# Patient Record
Sex: Female | Born: 1935
Health system: Southern US, Community
[De-identification: ages and names within clinical notes are randomized; demographics above are authoritative.]

## PROBLEM LIST (undated history)

## (undated) DIAGNOSIS — C801 Malignant (primary) neoplasm, unspecified: Secondary | ICD-10-CM

## (undated) DIAGNOSIS — M1990 Unspecified osteoarthritis, unspecified site: Secondary | ICD-10-CM

## (undated) DIAGNOSIS — E785 Hyperlipidemia, unspecified: Secondary | ICD-10-CM

## (undated) DIAGNOSIS — D649 Anemia, unspecified: Secondary | ICD-10-CM

## (undated) DIAGNOSIS — I10 Essential (primary) hypertension: Secondary | ICD-10-CM

## (undated) DIAGNOSIS — K635 Polyp of colon: Secondary | ICD-10-CM

## (undated) DIAGNOSIS — M7020 Olecranon bursitis, unspecified elbow: Secondary | ICD-10-CM

## (undated) DIAGNOSIS — R002 Palpitations: Secondary | ICD-10-CM

## (undated) HISTORY — DX: Polyp of colon: K63.5

## (undated) HISTORY — PX: CRYOTHERAPY: SHX1416

## (undated) HISTORY — DX: Essential (primary) hypertension: I10

## (undated) HISTORY — DX: Palpitations: R00.2

## (undated) HISTORY — DX: Anemia, unspecified: D64.9

## (undated) HISTORY — DX: Hyperlipidemia, unspecified: E78.5

## (undated) HISTORY — DX: Unspecified osteoarthritis, unspecified site: M19.90

## (undated) HISTORY — PX: POLYPECTOMY: SHX149

## (undated) HISTORY — DX: Olecranon bursitis, unspecified elbow: M70.20

---

## 1999-09-29 ENCOUNTER — Encounter: Payer: Self-pay | Admitting: Emergency Medicine

## 1999-09-29 ENCOUNTER — Emergency Department (HOSPITAL_COMMUNITY): Admission: EM | Admit: 1999-09-29 | Discharge: 1999-09-29 | Payer: Self-pay | Admitting: Emergency Medicine

## 1999-10-01 ENCOUNTER — Ambulatory Visit (HOSPITAL_BASED_OUTPATIENT_CLINIC_OR_DEPARTMENT_OTHER): Admission: RE | Admit: 1999-10-01 | Discharge: 1999-10-01 | Payer: Self-pay | Admitting: Orthopedic Surgery

## 2002-07-19 ENCOUNTER — Ambulatory Visit (HOSPITAL_COMMUNITY): Admission: RE | Admit: 2002-07-19 | Discharge: 2002-07-19 | Payer: Self-pay | Admitting: Internal Medicine

## 2002-07-19 ENCOUNTER — Encounter: Payer: Self-pay | Admitting: Internal Medicine

## 2004-01-15 HISTORY — PX: COLONOSCOPY W/ POLYPECTOMY: SHX1380

## 2005-11-13 ENCOUNTER — Other Ambulatory Visit: Admission: RE | Admit: 2005-11-13 | Discharge: 2005-11-13 | Payer: Self-pay | Admitting: Internal Medicine

## 2005-11-13 ENCOUNTER — Ambulatory Visit: Payer: Self-pay | Admitting: Internal Medicine

## 2005-12-01 ENCOUNTER — Ambulatory Visit: Payer: Self-pay | Admitting: Internal Medicine

## 2006-01-19 ENCOUNTER — Ambulatory Visit: Payer: Self-pay | Admitting: Gastroenterology

## 2006-01-27 ENCOUNTER — Ambulatory Visit: Payer: Self-pay | Admitting: Gastroenterology

## 2006-01-27 ENCOUNTER — Encounter: Payer: Self-pay | Admitting: Gastroenterology

## 2006-10-16 ENCOUNTER — Ambulatory Visit: Payer: Self-pay | Admitting: Internal Medicine

## 2007-06-21 ENCOUNTER — Encounter: Payer: Self-pay | Admitting: *Deleted

## 2007-06-21 DIAGNOSIS — Z0000 Encounter for general adult medical examination without abnormal findings: Secondary | ICD-10-CM

## 2007-06-21 DIAGNOSIS — E785 Hyperlipidemia, unspecified: Secondary | ICD-10-CM | POA: Insufficient documentation

## 2007-06-21 DIAGNOSIS — I10 Essential (primary) hypertension: Secondary | ICD-10-CM

## 2007-06-21 DIAGNOSIS — M7020 Olecranon bursitis, unspecified elbow: Secondary | ICD-10-CM | POA: Insufficient documentation

## 2007-06-21 DIAGNOSIS — R002 Palpitations: Secondary | ICD-10-CM | POA: Insufficient documentation

## 2007-10-25 ENCOUNTER — Ambulatory Visit: Payer: Self-pay | Admitting: Internal Medicine

## 2007-10-25 DIAGNOSIS — H8309 Labyrinthitis, unspecified ear: Secondary | ICD-10-CM | POA: Insufficient documentation

## 2008-01-20 ENCOUNTER — Encounter: Payer: Self-pay | Admitting: Internal Medicine

## 2009-02-01 ENCOUNTER — Encounter: Payer: Self-pay | Admitting: Internal Medicine

## 2009-06-06 ENCOUNTER — Encounter: Payer: Self-pay | Admitting: Internal Medicine

## 2009-06-06 DIAGNOSIS — R21 Rash and other nonspecific skin eruption: Secondary | ICD-10-CM | POA: Insufficient documentation

## 2010-02-04 ENCOUNTER — Encounter: Payer: Self-pay | Admitting: Internal Medicine

## 2010-04-16 ENCOUNTER — Telehealth: Payer: Self-pay | Admitting: Internal Medicine

## 2010-04-23 ENCOUNTER — Ambulatory Visit: Payer: Self-pay | Admitting: Internal Medicine

## 2010-04-23 LAB — CONVERTED CEMR LAB
ALT: 16 units/L (ref 0–35)
AST: 23 units/L (ref 0–37)
Albumin: 4 g/dL (ref 3.5–5.2)
Alkaline Phosphatase: 78 units/L (ref 39–117)
Bilirubin, Direct: 0.1 mg/dL (ref 0.0–0.3)
Cholesterol: 161 mg/dL (ref 0–200)
HDL: 44.9 mg/dL (ref >39.00)
LDL Cholesterol: 93 mg/dL (ref 0–99)
Total Bilirubin: 0.6 mg/dL (ref 0.3–1.2)
Total CHOL/HDL Ratio: 4
Total Protein: 7.2 g/dL (ref 6.0–8.3)
Triglycerides: 118 mg/dL (ref 0.0–149.0)
VLDL: 23.6 mg/dL (ref 0.0–40.0)

## 2010-04-24 ENCOUNTER — Ambulatory Visit: Payer: Self-pay | Admitting: Internal Medicine

## 2010-05-31 ENCOUNTER — Ambulatory Visit: Payer: Self-pay | Admitting: Internal Medicine

## 2010-05-31 ENCOUNTER — Encounter: Payer: Self-pay | Admitting: Internal Medicine

## 2010-05-31 LAB — CONVERTED CEMR LAB
ALT: 17 units/L (ref 0–35)
AST: 24 units/L (ref 0–37)
Albumin: 3.9 g/dL (ref 3.5–5.2)
Alkaline Phosphatase: 74 units/L (ref 39–117)
BUN: 17 mg/dL (ref 6–23)
Basophils Absolute: 0 10*3/uL (ref 0.0–0.1)
Basophils Relative: 0.4 % (ref 0.0–3.0)
Bilirubin, Direct: 0.1 mg/dL (ref 0.0–0.3)
CO2: 28 meq/L (ref 19–32)
Calcium: 9.4 mg/dL (ref 8.4–10.5)
Chloride: 108 meq/L (ref 96–112)
Cholesterol: 152 mg/dL (ref 0–200)
Creatinine, Ser: 1 mg/dL (ref 0.4–1.2)
Eosinophils Absolute: 0.1 10*3/uL (ref 0.0–0.7)
Eosinophils Relative: 1.9 % (ref 0.0–5.0)
GFR calc non Af Amer: 66.5 mL/min (ref >60.00)
Glucose, Bld: 90 mg/dL (ref 70–99)
HCT: 41.1 % (ref 36.0–46.0)
HDL: 44.4 mg/dL (ref >39.00)
Hemoglobin: 14.1 g/dL (ref 12.0–15.0)
LDL Cholesterol: 90 mg/dL (ref 0–99)
Lymphocytes Relative: 38 % (ref 12.0–46.0)
Lymphs Abs: 2.3 10*3/uL (ref 0.7–4.0)
MCHC: 34.3 g/dL (ref 30.0–36.0)
MCV: 95.8 fL (ref 78.0–100.0)
Monocytes Absolute: 0.5 10*3/uL (ref 0.1–1.0)
Monocytes Relative: 7.5 % (ref 3.0–12.0)
Neutro Abs: 3.1 10*3/uL (ref 1.4–7.7)
Neutrophils Relative %: 52.2 % (ref 43.0–77.0)
Platelets: 186 10*3/uL (ref 150.0–400.0)
Potassium: 3.8 meq/L (ref 3.5–5.1)
RBC: 4.29 M/uL (ref 3.87–5.11)
RDW: 13.2 % (ref 11.5–14.6)
Sodium: 143 meq/L (ref 135–145)
TSH: 0.82 microintl units/mL (ref 0.35–5.50)
Total Bilirubin: 0.7 mg/dL (ref 0.3–1.2)
Total CHOL/HDL Ratio: 3
Total Protein: 7.3 g/dL (ref 6.0–8.3)
Triglycerides: 90 mg/dL (ref 0.0–149.0)
VLDL: 18 mg/dL (ref 0.0–40.0)
WBC: 6 10*3/uL (ref 4.5–10.5)

## 2010-07-16 NOTE — Assessment & Plan Note (Signed)
Summary: follow up on labs-lb   Vital Signs:  Patient profile:   77 year old female Height:      64 inches Weight:      172 pounds BMI:     29.63 O2 Sat:      97 % on Room air Temp:     97.4 degrees F oral Pulse rate:   54 / minute BP sitting:   110 / 68  (left arm) Cuff size:   regular  Vitals Entered By: Bill Salinas CMA (April 24, 2010 9:48 AM)  O2 Flow:  Room air CC: follow up on labs that were drawn yesterday/ ab   Primary Care Provider:  Norins  CC:  follow up on labs that were drawn yesterday/ ab.  History of Present Illness: patient was last seen in May '09 for an acute problem. No full exam in EMR dating to Oct '08 or before. She presents today becsue she was asked to have lab work done prior to [employer] of vytorin with no labs in the system since '09.   she reports she is feeling well. She has had mammography. She is not prepared today to have an annual wellness exam.  Current Medications (verified): 1)  Lisinopril-Hydrochlorothiazide 10-12.5 Mg  Tabs (Lisinopril-Hydrochlorothiazide) .... Take One Tablet Once Daily 2)  Vytorin 10-20 Mg  Tabs (Ezetimibe-Simvastatin) .... Take One Tablet Once Daily  Allergies (verified): No Known Drug Allergies  Past History:  Past Medical History: Last updated: 06/21/2007 Hx of BURSITIS, ELBOW (ICD-726.33) HYPERLIPIDEMIA (ICD-272.4) HYPERTENSION (ICD-401.9) Hx of PALPITATIONS, HX OF (ICD-V12.50)    Past Surgical History: Last updated: 06/21/2007 POLYPECTOMY, HX OF (ICD-V15.9)  Physical Exam  General:  alert, well-developed, well-nourished, and well-hydrated.   Eyes:  vision grossly intact, pupils equal, and pupils round.   Lungs:  normal respiratory effort.   Neurologic:  alert & oriented X3 and gait normal.   Psych:  normally interactive and good eye contact.     Impression & Recommendations:  Problem # 1:  HYPERLIPIDEMIA (ICD-272.4) LDL 93 HDL normal, Liver functions normal  Plan - vytorin renewed  Her  updated medication list for this problem includes:    Vytorin 10-20 Mg Tabs (Ezetimibe-simvastatin) .Marland Kitchen... Take one tablet once daily  Complete Medication List: 1)  Lisinopril-hydrochlorothiazide 10-12.5 Mg Tabs (Lisinopril-hydrochlorothiazide) .... Take one tablet once daily 2)  Vytorin 10-20 Mg Tabs (Ezetimibe-simvastatin) .... Take one tablet once daily   Orders Added: 1)  Est. Patient Level II [16109]

## 2010-07-16 NOTE — Progress Notes (Signed)
  Phone Note Refill Request Message from:  Fax from Pharmacy on April 16, 2010 8:54 AM  Refills Requested: Medication #1:  VYTORIN 10-20 MG  TABS Take one tablet once daily. Pt hasn't been seen since 2009, please Advise refills  Initial call taken by: Ami Bullins CMA,  April 16, 2010 8:54 AM  Follow-up for Phone Call        ok for one month refill. needs ov with labs bvefore liid 272.4, hepatic 995.20 Follow-up by: Jacques Navy MD,  April 16, 2010 9:21 AM  Additional Follow-up for Phone Call Additional follow up Details #1::        prescriptions sent in for one month supply, appt made for Apr 23, 2010 and labs put in prior to appt Additional Follow-up by: Ami Bullins CMA,  April 16, 2010 10:31 AM    Prescriptions: VYTORIN 10-20 MG  TABS (EZETIMIBE-SIMVASTATIN) Take one tablet once daily  #30 Tablet x 1   Entered by:   Ami Bullins CMA   Authorized by:   Jacques Navy MD   Signed by:   Bill Salinas CMA on 04/16/2010   Method used:   [facility]  [address] [phone][facility] (retail[facility]       [address]       [place], [place]  [address]       Ph: [phone] or [phone]       Fax: [phone]   RxID:   [id number]

## 2010-07-18 NOTE — Assessment & Plan Note (Signed)
Summary: MEDICARE WELLNESS/ STATES SHE ALREADY DID LABS/NWS  #   Vital Signs:  Patient profile:   77 year old female Height:      64 inches Weight:      176 pounds BMI:     30.32 O2 Sat:      94 % on Room air Temp:     98.5 degrees F oral Pulse rate:   57 / minute BP sitting:   130 / 78  (left arm) Cuff size:   regular  Vitals Entered By: Bill Salinas CMA (May 31, 2010 1:22 PM)  O2 Flow:  Room air  Vision Screening:      Vision Comments: pt due for eye exam   Primary Care Provider:  Johnatan Baskette   History of Present Illness: Haley Mcmillan presents for a preventive care visit and general physical exam. She reports that she is feeling well and has no medical complaints. Her interval medical history is benighn with no serios illness, surgery or injury.  She is 100% independent in all ADLs. She is very active in her church and is coordinatingMC'ing the holiday pagent. She is cognitively intact carrying out all the duties of her social commitments and running her household. She has no signs or symptoms of depression. She has had no falls and has no fall risk.   Preventive Screening-Counseling & Management  Alcohol-Tobacco     Alcohol drinks/day: 0     Smoking Status: quit     Year Quit: 1996  Caffeine-Diet-Exercise     Caffeine use/day: none     Diet Comments: unregulated diet     Diet Counseling: to improve diet; diet is suboptimal     Does Patient Exercise: no     Exercise Counseling: to improve exercise regimen  Hep-HIV-STD-Contraception     Hepatitis Risk: no risk noted     HIV Risk: no risk noted     STD Risk: no risk noted     Dental Visit-last 6 months no     Nov 25, 2022 Exposure-Excessive: no  Safety-Violence-Falls     Seat Belt Use: yes     Helmet Use: n/a     Firearms in the Home: no firearms in the home     Smoke Detectors: yes     Violence in the Home: no risk noted     Sexual Abuse: no     Fall Risk: low fall risk      Sexual History:  currently monogamous.         Drug Use:  never.        Blood Transfusions:  no.    Current Medications (verified): 1)  Lisinopril-Hydrochlorothiazide 10-12.5 Mg  Tabs (Lisinopril-Hydrochlorothiazide) .... Take One Tablet Once Daily 2)  Vytorin 10-20 Mg  Tabs (Ezetimibe-Simvastatin) .... Take One Tablet Once Daily  Allergies (verified): No Known Drug Allergies  Past History:  Past Medical History: Last updated: 06/21/2007 Hx of BURSITIS, ELBOW (ICD-726.33) HYPERLIPIDEMIA (ICD-272.4) HYPERTENSION (ICD-401.9) Hx of PALPITATIONS, HX OF (ICD-V12.50)    Past Surgical History: Last updated: 06/21/2007 POLYPECTOMY, HX OF (ICD-V15.9)  Family History: father - deceased @ 45: aneurysm, CAD mother - deceased @ 55: Gallbladder disease Neg- colon cancer, DM, MI 2  sisters with breast cancer  Social History: [employer],  tech school Married - November 25, 2054 - 19 yrs divorced; married November 24, 1977  2 sons - ' 56, '59 - died MVA; 1 dtr November 25, 2059; 9 grandchildren; 6 great-grands work - Architectural technologist, 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 no h/o abuse.Caffeine use/day:  none Does Patient Exercise:  no Dental Care w/in 6 mos.:  no Sun Exposure-Excessive:  no Seat Belt Use:  yes Fall Risk:  low fall risk Blood Transfusions:  no Hepatitis Risk:  no risk noted HIV Risk:  no risk noted STD Risk:  no risk noted Sexual History:  currently monogamous Drug Use:  never  Review of Systems  The patient denies anorexia, fever, weight loss, weight gain, vision loss, decreased hearing, chest pain, dyspnea on exertion, headaches, abdominal pain, severe indigestion/heartburn, incontinence, muscle weakness, difficulty walking, depression, abnormal bleeding, enlarged lymph nodes, and breast masses.    Physical Exam  General:  Well-developed,well-nourished AA woman,in no acute distress; alert,appropriate and cooperative throughout examination Head:  Normocephalic and atraumatic without obvious abnormalities. No apparent alopecia or balding. Eyes:  vision grossly intact,  pupils equal, pupils round, and corneas and lenses clear.   Ears:  R ear normal, L ear normal, and no external deformities. Mild amont of cermen right ear canal, left canal is clear. TM's appear normal  Nose:  no external deformity and no external erythema.   Mouth:  no bccal lesions, throat is clear Neck:  supple, full ROM, no thyromegaly, and no carotid bruits.   Chest Wall:  no deformities and no tenderness.   Breasts:  No mass, nodules, thickening, tenderness, bulging, retraction, inflamation, nipple discharge or skin changes noted.   Lungs:  Normal respiratory effort, chest expands symmetrically. Lungs are clear to auscultation, no crackles or wheezes. Heart:  Normal rate and regular rhythm. S1 and S2 normal without gallop, murmur, click, rub or other extra sounds. Abdomen:  soft, non-tender, normal bowel sounds, no guarding, no rigidity, and no hepatomegaly.   Genitalia:  deferred Msk:  normal ROM, no joint tenderness, no joint swelling, no joint warmth, no redness over joints, and no joint deformities.   Pulses:  2+ radila pulses Extremities:  No clubbing, cyanosis, edema, or deformity noted with normal full range of motion of all joints.   Neurologic:  alert & oriented X3, cranial nerves II-XII intact, strength normal in all extremities, gait normal, and DTRs symmetrical and normal.   Skin:  turgor normal, color normal, no suspicious lesions, and no ulcerations.   Cervical Nodes:  no anterior cervical adenopathy and no posterior cervical adenopathy.   Axillary Nodes:  no R axillary adenopathy and no L axillary adenopathy.   Psych:  Oriented X3, memory intact for recent and remote, and normally interactive.     Impression & Recommendations:  Problem # 1:  POLYPECTOMY, HX OF (ICD-V15.9) no colonoscopy report in EMR. Patient does not recall the date of her last colonoscopy.  Plan - will check paper chart for old reports. Pt. will check her home records  Problem # 2:  HYPERLIPIDEMIA  (ICD-272.4) Due for follow-up lab with recommendations to follow.  Her updated medication list for this problem includes:    Vytorin 10-20 Mg Tabs (Ezetimibe-simvastatin) .Marland Kitchen... Take one tablet once daily  Orders: TLB-Lipid Panel (80061-LIPID) TLB-Hepatic/Liver Function Pnl (80076-HEPATIC) TLB-TSH (Thyroid Stimulating Hormone) (84443-TSH)  Addendum - excellent control of lipids on crrent medications.   Plan - continue vytorin at present dose.  Problem # 3:  HYPERTENSION (ICD-401.9)  Her updated medication list for this problem includes:    Lisinopril-hydrochlorothiazide 10-12.5 Mg Tabs (Lisinopril-hydrochlorothiazide) .Marland Kitchen... Take one tablet once daily  Orders: TLB-BMP (Basic Metabolic Panel-BMET) (80048-METABOL)  BP today: 130/78 Prior BP: 110/68 (04/24/2010)  Adequate control on preent medications. Will continue the same.  Problem # 4:  Hx of PALPITATIONS, HX OF (ICD-V12.50)  No complaints at today's visit. 12 Lead EKG without arrythmia.  Orders: EKG w/ Interpretation (93000)  Problem # 5:  Preventive Health Care (ICD-V70.0)  Interval history is unremarkable. Physical exam is normal. Lab results are within normal limits (see attached). Current with mammography with last study Aug '11. No record of colonoscopy in EMR. Immunizations: patient offered and declined tetnus and pneumo-vax today but is willing to return for the same. She is also a candidate for  shingles vaccine. 12 lead EKG without arrythmia, with changes that could represent pulmonary disease and no signs of ischemia. Spirometry,  former smoker with EKG changes as noted, is normal with lung age less than chronologic age.   In summary - a nice woman who is medically stable with good control of chronic medical problems and who appears fit. She is counseled to continue with a heart healthy low fat diet, to develop a regular exercise program and to help insure that she is current with colorectal cancer screening. She is  advised to retrn at her convenience for immunizations.  Orders: Medicare -1st Annual Wellness Visit [phone])  Complete Medication List: 1)  Lisinopril-hydrochlorothiazide 10-12.5 Mg Tabs (Lisinopril-hydrochlorothiazide) .... Take one tablet once daily 2)  Vytorin 10-20 Mg Tabs (Ezetimibe-simvastatin) .... Take one tablet once daily  Other Orders: Spirometry w/Graph (94010) TLB-CBC Platelet - w/Differential (85025-CBCD)  atient: Haley Mcmillan Note: All result statuses are Final unless otherwise noted.  Tests: (1) Lipid Panel (LIPID)   Cholesterol               152 mg/dL                   2-440     ATP III Classification            Desirable:  < 200 mg/dL                    Borderline High:  200 - 239 mg/dL               High:  > = 240 mg/dL   Triglycerides             90.0 mg/dL                  1.0-272.5     Normal:  <150 mg/dL     Borderline High:  366 - 199 mg/dL   HDL                       44.03 mg/dL                 >47.42   VLDL Cholesterol          18.0 mg/dL                  5.9-56.3   LDL Cholesterol           90 mg/dL                    8-75  CHO/HDL Ratio:  CHD Risk                             3                    Men          Women     1/2 Average Risk     3.4  3.3     Average Risk          5.0          4.4     2X Average Risk          9.6          7.1     3X Average Risk          15.0          11.0                           Tests: (2) Hepatic/Liver Function Panel (HEPATIC)   Total Bilirubin           0.7 mg/dL                   5.7-8.4   Direct Bilirubin          0.1 mg/dL                   6.9-6.2   Alkaline Phosphatase      74 U/L                      39-117   AST                       24 U/L                      0-37   ALT                       17 U/L                      0-35   Total Protein             7.3 g/dL                    9.5-2.8   Albumin                   3.9 g/dL                    4.1-3.2  Tests: (3) BMP (METABOL)   Sodium                     143 mEq/L                   135-145   Potassium                 3.8 mEq/L                   3.5-5.1   Chloride                  108 mEq/L                   96-112   Carbon Dioxide            28 mEq/L                    19-32   Glucose                   90 mg/dL  70-99   BUN                       17 mg/dL                    1-61   Creatinine                1.0 mg/dL                   0.9-6.0   Calcium                   9.4 mg/dL                   4.5-40.9   GFR                       66.50 mL/min                >60.00  Tests: (4) CBC Platelet w/Diff (CBCD)   White Cell Count          6.0 K/uL                    4.5-10.5   Red Cell Count            4.29 Mil/uL                 3.87-5.11   Hemoglobin                14.1 g/dL                   81.1-91.4   Hematocrit                41.1 %                      36.0-46.0   MCV                       95.8 fl                     78.0-100.0   MCHC                      34.3 g/dL                   78.2-95.6   RDW                       13.2 %                      11.5-14.6   Platelet Count            186.0 K/uL                  150.0-400.0   Neutrophil %              52.2 %                      43.0-77.0   Lymphocyte %              38.0 %                      12.0-46.0   Monocyte %  7.5 %                       3.0-12.0   Eosinophils%              1.9 %                       0.0-5.0   Basophils %               0.4 %                       0.0-3.0   Neutrophill Absolute      3.1 K/uL                    1.4-7.7   Lymphocyte Absolute       2.3 K/uL                    0.7-4.0   Monocyte Absolute         0.5 K/uL                    0.1-1.0  Eosinophils, Absolute                             0.1 K/uL                    0.0-0.7   Basophils Absolute        0.0 K/uL                    0.0-0.1  Tests: (5) TSH (TSH)   FastTSH                   0.82 uIU/mL                 0.35-5.50Prescriptions: VYTORIN 10-20 MG  TABS  (EZETIMIBE-SIMVASTATIN) Take one tablet once daily  #30 Tablet x 12   Entered and Authorized by:   Jacques Navy MD   Signed by:   Jacques Navy MD on 05/31/2010   Method used:   [facility]        [facility] [address] [phone]* (retail)       [address]       [place], [place]  [address]       Ph: [phone] or [phone]       Fax: [phone]   RxID:   [id number] LISINOPRIL-HYDROCHLOROTHIAZIDE 10-12.5 MG  TABS (LISINOPRIL-HYDROCHLOROTHIAZIDE) Take one tablet once daily  #30 Tablet x 12   Entered and Authorized by:   Jacques Navy MD   Signed by:   Jacques Navy MD on 05/31/2010   Method used:   [facility]        [facility]  [address] [phone]* (retail)       [address]       [place], [place]  [address]       Ph: [phone] or [phone]       Fax: [phone]   RxID:   [id number]    Orders Added: 1)  Medicare -1st Annual Wellness Visit [G0438] 2)  Est. Patient Level III [62831] 3)  EKG w/ Interpretation [93000] 4)  Spirometry w/Graph [94010] 5)  TLB-Lipid  Panel [80061-LIPID] 6)  TLB-Hepatic/Liver Function Pnl [80076-HEPATIC] 7)  TLB-BMP (Basic Metabolic Panel-BMET) [80048-METABOL] 8)  TLB-CBC Platelet - w/Differential [85025-CBCD] 9)  TLB-TSH (Thyroid Stimulating Hormone) [81191-YNW]

## 2010-11-01 NOTE — Op Note (Signed)
[facility]  Patient:    Haley Mcmillan, Haley Mcmillan                     MRN: [id number] Proc. Date: 10/01/99 Adm. Date:  [id number] Disc. Date: [id number] Attending:  Sandi Raveling CC:         Jearld Adjutant, M.D.                           Operative Report  PREOPERATIVE DIAGNOSIS:  Left distal radius fracture, closed, comminuted, volar Barton tip, impacted.  POSTOPERATIVE DIAGNOSIS:  Left distal radius fracture, closed, comminuted, volar Barton tip, impacted.  OPERATION PERFORMED:  Closed manipulative reduction, left distal radius fracture with application of Orthofix external fixator and two crossed K-wire pins.  SURGEON:  Jearld Adjutant, M.D.  ASSISTANT:  Currie Paris. Thedore Mins.  ANESTHESIA:  General with LMA.  CULTURES:  None.  DRAINS:  None.  ESTIMATED BLOOD LOSS:  Minimal.  TOURNIQUET TIME:  39 minutes.  PATHOLOGIC FINDINGS AND HISTORY:  Keely tripped coming out of a restaurant on Saturday, sustained this fracture, was splinted and seen in the office yesterday.  It was very shortened, impacted and we felt only amenable to an external fixator.  This fracture was reduced in supination, ulnar deviation, traction, reducing the volar Barton angulation and reversed Colles position. C-arm fluoroscopy i.e. the OEC mini-C-arm confirmed positioning.  Two cross K-wire pins, 0.54 were placed from radial styloid distal proximal volar dorsal and distal proximal dorsal volar.  The patient was able to fully flex the fingers into the palm to the distal palmar flexion crease passively with the amount of traction applied.  We may back this off depending on her ability to flex in the office.  Again, essentially anatomic reduction was obtained.  DESCRIPTION OF PROCEDURE:  With adequate anesthesia obtained using LMA technique, 1 gm Ancef given IV prophylaxis, the patient was placed in the supine position, the left upper extremity was prepped from the  finger tips to the tourniquet in standard fashion.  After standard prepping and draping, Esmarch exsanguination was used.  The tourniquet was let up to 250 mmHg. Closed manipulative reduction maneuver was carried out.  Finger traps were placed on the index and thumb with 20 pounds of weight.  We then obtained our basic reduction and made an incision over the dorsal radial aspect of the base of the second metacarpal.  Dissection was carried down to bone, retractors placed and the guide put in place and the short track fixation pins put in place to distal.  We then placed the fixator on to adjust our length, made an incision.  The dorsal radial aspect of the midforearm, dissected down carefully avoiding the radial sensory branch down to the bone and then set retractors.  The guides were placed and two of the longer pins were put in place.  The Orthofix fixator was then put on and we tried various reduction maneuvers with traction, ulnar deviation, pronation supination and flexion and ended up with the one previously mentioned.  When we were happy with the reduction on AP and lateral view, the traction pins and clamps were tightened as well as all nuts in the fixation device.  I then placed to criss cross 0.45 K-wires as mentioned above.  When I was satisfied with the reduction, the pins were bent and cut so as to not migrate.  We had closed the wounds around  the pins with 4-0 nylon.  A bulky sterile compressive dressing was applied with a volar plaster splint and Ace.  The patient having tolerated the procedure well was awakened and take to the recovery room in satisfactory condition for routine postoperative care to be discharged per outpatient routine, told to call the office for appointment for recheck on Monday. DD:  10/01/99 TD:  10/01/99 Job: [id number]

## 2010-11-15 ENCOUNTER — Telehealth: Payer: Self-pay | Admitting: *Deleted

## 2010-11-15 NOTE — Telephone Encounter (Signed)
Pt left vm c/o vaginal discharge - need to call back to get further info.

## 2010-11-15 NOTE — Telephone Encounter (Signed)
Pt c/o white-yellow vaginal discharge x 2 weeks. No pain, burning, odor or any irritation. She is very concerned. Scheduled for OV Monday.

## 2010-11-18 ENCOUNTER — Ambulatory Visit (INDEPENDENT_AMBULATORY_CARE_PROVIDER_SITE_OTHER): Payer: Medicare Other | Admitting: Internal Medicine

## 2010-11-18 DIAGNOSIS — N760 Acute vaginitis: Secondary | ICD-10-CM

## 2010-11-18 DIAGNOSIS — A499 Bacterial infection, unspecified: Secondary | ICD-10-CM

## 2010-11-18 DIAGNOSIS — B9689 Other specified bacterial agents as the cause of diseases classified elsewhere: Secondary | ICD-10-CM

## 2010-11-18 DIAGNOSIS — F4321 Adjustment disorder with depressed mood: Secondary | ICD-10-CM

## 2010-11-18 MED ORDER — METRONIDAZOLE 500 MG PO TABS: 500.0000 mg | ORAL_TABLET | ORAL | Status: DC

## 2010-11-18 MED ORDER — ALPRAZOLAM 0.5 MG PO TABS: 0.5000 mg | ORAL_TABLET | Freq: Three times a day (TID) | ORAL | Status: AC | PRN

## 2010-11-18 NOTE — Patient Instructions (Signed)
Grief and Loss - av ery normal reaction. Please consider grief and loss counseling through Hospice. For episodes of emotional grief take xanax (alprazolam) 0.5 mg as often as every 6 hours. It takes 15 minutes to take effect and last about 3 hours. It can help  Vaginal discharge - by your description this sounds like bacterial vaginosis - a common infection in woman that just happens. Plan - flagyl (metronidazole) 500mg  - take two (2) tablets in the AM and two (2) tablets in the PM for one day treatment. If this doesn't clear the discharge you will need to have a pelvic and wet prep to make a better diagnosis.   Bacterial Vaginosis (BV) Bacterial vaginosis (BV) is a vaginal infection where the normal balance of bacteria in the vagina is disrupted. The normal balance is then replaced by an overgrowth of certain bacteria. There are several different kinds of bacteria that can cause BV. BV is the most common vaginal infection in women of childbearing age. CAUSES  The cause of BV is not fully understood. BV develops when there is an increase or imbalance of harmful bacteria.   Some activities or behaviors can upset the normal balance of bacteria in the vagina and put women at increased risk including:   Having a new sex partner or multiple sex partners.   Douching.   Using an intrauterine device (IUD) for contraception.   It is not clear what role sexual activity plays in the development of BV. However, women that have never had sexual intercourse are rarely infected with BV.  Women do not get BV from toilet seats, bedding, swimming pools or from touching objects around them.   SYMPTOMS  Grey vaginal discharge.   A fish like odor with discharge, especially after sexual intercourse.   Itching or burning of the vagina and vulva.   Burning or pain with urination.   Some women have no signs or symptoms at all.  DIAGNOSIS   Your caregiver must examine the vagina for signs of BV. Your  caregiver will perform lab tests and look at the sample of vaginal fluid through a microscope. They will look for bacteria and abnormal cells (clue cells), a pH test higher than 4.5, and a positive amine test all associated with BV.   RISKS AND COMPLICATIONS  Pelvic inflammatory disease (PID).   Infections following gynecology surgery.   Developing HIV.   Developing herpes virus.  TREATMENT Sometimes BV will clear up without treatment. However, all women with symptoms of BV should be treated to avoid complications, especially if gynecology surgery is planned. Female partners generally do not need to be treated. However, BV may spread between female sex partners so treatment is helpful in preventing a recurrence of BV.    BV may be treated with medicines that kill germs (antibiotics). The antibiotics come in either pill or vaginal cream forms. Either can be used with non-pregnant or pregnant women, but the recommended dosages differ. These antibiotics are not harmful to the baby.   BV can recur after treatment. If this happens, a second course of antibiotics will often be prescribed.   Treatment is important for pregnant women. If not treated, BV can cause a premature delivery, especially for a pregnant woman who had a premature birth in the past. All pregnant women who have symptoms of BV should be checked and treated.   For chronic reoccurrence of BV, treatment with a type of prescribed gel vaginally twice a week is helpful.  HOME CARE  INSTRUCTIONS  Finish all medication as directed by your caregiver.   Do not have sex until treatment is completed.   Tell your sexual partner that you have a vaginal infection. They should see their caregiver and be treated if they have problems, such as a mild rash or itching.   Practice safe sex. Use condoms. Only have one sex partner.  PREVENTION Basic prevention steps can help reduce the risk of upsetting the natural balance of bacteria in the vagina  and developing BV:  Do not have sexual intercourse (be abstinent).   Do not douche.   Use all of the medicine prescribed for treatment of BV, even if the signs and symptoms go away.   Tell your sex partner if you have BV. That way, they can be treated, if needed, to prevent reoccurrence.  SEEK MEDICAL CARE IF:  Your symptoms are not improving after three days of treatment.   You have increased discharge, pain or fever.  MAKE SURE YOU:    Understand these instructions.   Will watch your condition.   Will get help right away if you are not doing well or get worse.  FOR MORE INFORMATION Division of STD Prevention (DSTDP)  Centers for Disease Control and Prevention SolutionApps.co.za Order Publications Online at ChoiceTips.dk   [facility] Prevention Information Network (NPIN)  E-mail: info@cdcnpin .org www.cdcnpin.org    American Social Health Association (ASHA) Erskin Burnet. [address] [place], [place] [address] www.ashastd.org   Document Released: 06/02/2005 Document Re-Released: 08/27/2009 [facility] Patient Information 2011 [place], [place].

## 2010-11-20 NOTE — Progress Notes (Signed)
  Subjective:    Patient ID: Haley Mcmillan, female    DOB: 03/11/1934, 77 y.o.   MRN: [id number]  HPI Haley Mcmillan presents today ostensibly due to  Vaginal discharge of a creamy, non-curdy, non-oderous nature. She has no pain, no pruritis. She is emotionally overwrought. Her 92 y/o son recently died from lung cancer and she has intermittent bouts of grief and tearfulness. Her son was a hospice patient. She has not yet taken advantage of grief and loss counseling.  PMH, FamHx and SocHx reviewed for any changes and relevance.    Review of Systems Review of Systems  Constitutional:  Negative for fever, chills, activity change and unexpected weight change.  HENT:  Negative for hearing loss, ear pain, congestion, neck stiffness and postnasal drip.   Eyes: Negative for pain, discharge and visual disturbance.  Respiratory: Negative for chest tightness and wheezing.   Cardiovascular: Negative for chest pain and palpitations.       [No decreased exercise tolerance Gastrointestinal: [No change in bowel habit. No bloating or gas. No reflux or indigestion Genitourinary: Negative for urgency, frequency, flank pain and difficulty urinating.  Musculoskeletal: Negative for myalgias, back pain, arthralgias and gait problem.  Neurological: Negative for dizziness, tremors, weakness and headaches.  Hematological: Negative for adenopathy.  Psychiatric/Behavioral: Positive for dysphoric mood/grief.       Objective:   Physical Exam Vitals noted WNWD AA woman who is emotionally distraught and tearful Cor -rrr       Assessment & Plan:  1. Vaginitis - her history is most consistent with bacterial vaginosis.  Plan - flagyl 1000mg  bid x 1 day           For persistent symptoms will need pelvic and wet prep.  2. Grief and loss  Plan - Xanax 0.5 mg q 6 prn           Advised to take advantage of grief and loss counseling.

## 2010-12-24 ENCOUNTER — Other Ambulatory Visit: Payer: Self-pay | Admitting: *Deleted

## 2010-12-24 ENCOUNTER — Telehealth: Payer: Self-pay | Admitting: *Deleted

## 2010-12-24 MED ORDER — METRONIDAZOLE 500 MG PO TABS: ORAL_TABLET | ORAL | Status: DC

## 2010-12-24 NOTE — Telephone Encounter (Signed)
Pt was seen and treated for bacterial vaginosis. This occurred 5 days after having sex w/her husband. Med given cleared symptoms. She had sex again 5 days ago and symptoms have reoccurred.   1. She would liked RF of flagyl (this cleared infection completely) 2. Wants to know if husband should be "checked" - worried about why this has occurred again.   Does pt need OV for re-eval?

## 2010-12-24 NOTE — Telephone Encounter (Signed)
Patient informed - rx also sent in for husband.

## 2010-12-24 NOTE — Telephone Encounter (Signed)
They should both be treated with flagyl - same amount for each

## 2010-12-25 ENCOUNTER — Ambulatory Visit: Payer: Medicare Other | Admitting: Internal Medicine

## 2011-02-20 ENCOUNTER — Encounter: Payer: Self-pay | Admitting: Internal Medicine

## 2011-02-27 ENCOUNTER — Ambulatory Visit (INDEPENDENT_AMBULATORY_CARE_PROVIDER_SITE_OTHER): Payer: Medicare Other | Admitting: Internal Medicine

## 2011-02-27 ENCOUNTER — Encounter: Payer: Self-pay | Admitting: Internal Medicine

## 2011-02-27 VITALS — BP 118/70 | HR 55 | Temp 98.4°F | Ht 67.0 in | Wt 174.0 lb

## 2011-02-27 DIAGNOSIS — H6120 Impacted cerumen, unspecified ear: Secondary | ICD-10-CM

## 2011-02-27 NOTE — Progress Notes (Signed)
  Subjective:    Patient ID: Haley Mcmillan, female    DOB: [birth year], 77 y.o.   MRN: [id number]  HPI Haley Mcmillan is having a pressure type feeling in the right ear. No frank pain, no drainage. Has been going on for 2 weeks. NO clicking in the jaw. No fever or chills. Hearing is intermittently better.   She has had a problem of not having normal satiety with eating her normal portion. No abdominal pain or discomfort. Bowels are regular.   I have reviewed the patient's medical history in detail and updated the computerized patient record.    Review of Systems System review is negative for any constitutional, cardiac, pulmonary, GI or neuro symptoms or complaints     Objective:   Physical Exam Vitals noted. HEENT - right EAC obstructed with cerumen   Procedure Note :     Procedure :  Ear irrigation   Indication:  Cerumen impaction   Risks, including pain, dizziness, eardrum perforation, bleeding, infection and others as well as benefits were explained to the patient in detail. Verbal consent was obtained and the patient agreed to proceed.    We used "The [employer] Device" field with lukewarm water for irrigation. A large amount wax was recovered. Procedure has also required manual wax removal with an ear loop.   Tolerated well. Complications: None.   Postprocedure instructions :  Call if problems.         Assessment & Plan:  Cerumen impaction - hearing improved after irrigation and manual debridement with soft ear loop.

## 2011-03-05 ENCOUNTER — Other Ambulatory Visit: Payer: Self-pay | Admitting: Internal Medicine

## 2011-06-18 ENCOUNTER — Other Ambulatory Visit: Payer: Self-pay | Admitting: *Deleted

## 2011-06-18 MED ORDER — LISINOPRIL-HYDROCHLOROTHIAZIDE 10-12.5 MG PO TABS: 1.0000 | ORAL_TABLET | Freq: Every day | ORAL | Status: DC

## 2011-07-01 ENCOUNTER — Ambulatory Visit (INDEPENDENT_AMBULATORY_CARE_PROVIDER_SITE_OTHER): Payer: Medicare Other | Admitting: Internal Medicine

## 2011-07-01 VITALS — BP 118/74 | HR 55 | Temp 98.3°F | Wt 175.0 lb

## 2011-07-01 DIAGNOSIS — M65849 Other synovitis and tenosynovitis, unspecified hand: Secondary | ICD-10-CM

## 2011-07-01 DIAGNOSIS — M65839 Other synovitis and tenosynovitis, unspecified forearm: Secondary | ICD-10-CM

## 2011-07-01 DIAGNOSIS — M659 Synovitis and tenosynovitis, unspecified: Secondary | ICD-10-CM

## 2011-07-01 NOTE — Progress Notes (Signed)
  Subjective:    Patient ID: Haley Mcmillan, female    DOB: Feb 15, 1934, 78 y.o.   MRN: [id number]  HPI Mrs. Angelini presents for pain in the right wrist that started about 10 days ago. She does not recall any precipitating events: no injury, no overuse. The pain is at the ulnar aspect of the wrist above the carpal. She has no pain with grip but pain with supination. She has not noticed any swelling of the wrist. She has taken no medication for her discomfort.  Past Medical History  Diagnosis Date  . Olecranon bursitis     Elbow  . Other and unspecified hyperlipidemia   . Unspecified essential hypertension   . Palpitations    Past Surgical History  Procedure Date  . Polypectomy    Family History  Problem Relation Age of Onset  . Gallbladder disease Mother   . Aneurysm Father   . Heart disease Father     CAD  . Cancer Neg Hx     Colon  . Diabetes Neg Hx   . Heart attack Neg Hx   . Breast cancer Sister   . Breast cancer Sister    History   Social History  . Marital Status: Married    Spouse Name: N/A    Number of Children: N/A  . Years of Education: N/A   Occupational History  . retired    Social History Main Topics  . Smoking status: Never Smoker   . Smokeless tobacco: Not on file  . Alcohol Use: Not on file  . Drug Use: Not on file  . Sexually Active: Not on file   Other Topics Concern  . 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       Review of Systems System review is negative for any constitutional, cardiac, pulmonary, GI or neuro symptoms or complaints other than as described in the HPI.     Objective:   Physical Exam Filed Vitals:   07/01/11 1125  BP: 118/74  Pulse: 55  Temp: 98.3 F (36.8 C)   Gen'l- WNWD AA womn in no distress Respirations -  normal Cor - RRR Ext - full range of motion right wrist, negative Finkelstein's maneuver, no edema, no erythema of the small joints, no synovial thickening. Tender to palpation palmer aspect right wrist 4 cm from the minor thenar imminence.        Assessment & Plan:  Tendonitis - involving the flexor carpi ulnaris right  Plan - otc NSAIDs of choice           Heat and/or lineament           ROM daily           If not better steroid injection.

## 2011-07-01 NOTE — Patient Instructions (Signed)
Tendonitis right wrist at flexor tendon of the fifth digit.   Plan - over the counter anti-inflammatory medication, e.g. Aleve, motrin, etc.            Heat - either lineament or capsaicin cream or bayer therma patch            Gentle range of motion.  Tendinitis Tendinitis is swelling and inflammation of the tendons. Tendons are band-like tissues that connect muscle to bone. Tendinitis commonly occurs in the:    Shoulders (rotator cuff).     Heels (Achilles tendon).     Elbows (triceps tendon).  CAUSES Tendinitis is usually caused by overusing the tendon, muscles, and joints involved. When the tissue surrounding a tendon (synovium) becomes inflamed, it is called tenosynovitis. Tendinitis commonly develops in people whose jobs require repetitive motions. SYMPTOMS  Pain.     Tenderness.    Mild swelling.  DIAGNOSIS Tendinitis is usually diagnosed by physical exam. Your caregiver may also order X-rays or other imaging tests. TREATMENT Your caregiver may recommend certain medicines or exercises for your treatment. HOME CARE INSTRUCTIONS    Use a sling or splint for as long as directed by your caregiver until the pain decreases.     Put ice on the injured area.     Put ice in a plastic bag.     Place a towel between your skin and the bag.     Leave the ice on for 15 to 20 minutes, 3 to 4 times a day.     Avoid using the limb while the tendon is painful. Perform gentle range of motion exercises only as directed by your caregiver. Stop exercises if pain or discomfort increase, unless directed otherwise by your caregiver.     Only take over-the-counter or prescription medicines for pain, discomfort, or fever as directed by your caregiver.  SEEK MEDICAL CARE IF:    Your pain and swelling increase.     You develop new, unexplained symptoms, especially increased numbness in the hands.  MAKE SURE YOU:    Understand these instructions.     Will watch your condition.     Will  get help right away if you are not doing well or get worse.  Document Released: 05/30/2000 Document Revised: 02/12/2011 Document Reviewed: 08/19/2010 [facility] Patient Information 2012 [place], [place].

## 2011-09-23 ENCOUNTER — Other Ambulatory Visit: Payer: Self-pay | Admitting: Internal Medicine

## 2012-02-27 ENCOUNTER — Encounter: Payer: Self-pay | Admitting: Internal Medicine

## 2012-04-10 ENCOUNTER — Other Ambulatory Visit: Payer: Self-pay | Admitting: Internal Medicine

## 2012-07-13 ENCOUNTER — Other Ambulatory Visit: Payer: Self-pay | Admitting: Internal Medicine

## 2012-07-16 ENCOUNTER — Other Ambulatory Visit: Payer: Self-pay | Admitting: Internal Medicine

## 2012-10-14 ENCOUNTER — Other Ambulatory Visit: Payer: Self-pay | Admitting: Internal Medicine

## 2012-11-17 ENCOUNTER — Telehealth: Payer: Self-pay

## 2012-11-17 ENCOUNTER — Other Ambulatory Visit: Payer: Self-pay | Admitting: Internal Medicine

## 2012-11-17 MED ORDER — EZETIMIBE-SIMVASTATIN 10-20 MG PO TABS: 1.0000 | ORAL_TABLET | Freq: Every day | ORAL | Status: DC

## 2012-11-17 NOTE — Telephone Encounter (Signed)
Patient calls for refill on vytorin

## 2012-11-30 ENCOUNTER — Ambulatory Visit: Payer: Medicare Other | Admitting: Internal Medicine

## 2012-11-30 DIAGNOSIS — Z0289 Encounter for other administrative examinations: Secondary | ICD-10-CM

## 2012-12-08 ENCOUNTER — Other Ambulatory Visit (INDEPENDENT_AMBULATORY_CARE_PROVIDER_SITE_OTHER): Payer: Medicare Other

## 2012-12-08 ENCOUNTER — Ambulatory Visit (INDEPENDENT_AMBULATORY_CARE_PROVIDER_SITE_OTHER): Payer: Medicare Other | Admitting: Internal Medicine

## 2012-12-08 ENCOUNTER — Encounter: Payer: Self-pay | Admitting: Internal Medicine

## 2012-12-08 VITALS — BP 126/70 | HR 57 | Temp 97.9°F | Resp 12 | Ht 67.0 in | Wt 180.6 lb

## 2012-12-08 DIAGNOSIS — E785 Hyperlipidemia, unspecified: Secondary | ICD-10-CM

## 2012-12-08 DIAGNOSIS — I10 Essential (primary) hypertension: Secondary | ICD-10-CM

## 2012-12-08 LAB — LIPID PANEL
Cholesterol: 159 mg/dL (ref 0–200)
Total CHOL/HDL Ratio: 3
Triglycerides: 215 mg/dL — ABNORMAL HIGH (ref 0.0–149.0)
VLDL: 43 mg/dL — ABNORMAL HIGH (ref 0.0–40.0)

## 2012-12-08 LAB — COMPREHENSIVE METABOLIC PANEL
Alkaline Phosphatase: 96 U/L (ref 39–117)
BUN: 13 mg/dL (ref 6–23)
Glucose, Bld: 112 mg/dL — ABNORMAL HIGH (ref 70–99)
Sodium: 138 mEq/L (ref 135–145)
Total Bilirubin: 0.4 mg/dL (ref 0.3–1.2)

## 2012-12-08 LAB — HEPATIC FUNCTION PANEL
ALT: 21 U/L (ref 0–35)
AST: 27 U/L (ref 0–37)
Bilirubin, Direct: 0.1 mg/dL (ref 0.0–0.3)
Total Bilirubin: 0.4 mg/dL (ref 0.3–1.2)
Total Protein: 8.3 g/dL (ref 6.0–8.3)

## 2012-12-08 MED ORDER — EZETIMIBE-SIMVASTATIN 10-20 MG PO TABS: 1.0000 | ORAL_TABLET | Freq: Every day | ORAL | Status: DC

## 2012-12-08 MED ORDER — LISINOPRIL-HYDROCHLOROTHIAZIDE 10-12.5 MG PO TABS: ORAL_TABLET | ORAL | Status: DC

## 2012-12-08 NOTE — Patient Instructions (Addendum)
Good to see you.  Your last full physical exam and lab work was done in December 2011.  Refill prescriptions have been sent to your pharmacy.  You will need lab work today as part of routine care for the cholesterol and blood pressure problems.  Please schedule a full physical exam

## 2012-12-12 ENCOUNTER — Encounter: Payer: Self-pay | Admitting: Internal Medicine

## 2012-12-12 NOTE — Progress Notes (Signed)
  Subjective:    Patient ID: Haley Mcmillan, female    DOB: 1934/02/20, 79 y.o.   MRN: [id number]  HPI Haley Mcmillan presents for medication refill. She is due for full physical exam but was not scheduled appropriately. She is feeling ok.  Haley Mcmillan  Expired medications: vytorin 10/20, lisinopril/hct 10/12.5   Review of Systems System review is negative for any constitutional, cardiac, pulmonary, GI or neuro symptoms or complaints other than as described in the HPI.     Objective:   Physical Exam Filed Vitals:   12/08/12 1315  BP: 126/70  Pulse: 57  Temp: 97.9 F (36.6 C)  Resp: 12   Gen'l heavyset AA woman in no distress Cor - RRR Pulm - normal respirations       Assessment & Plan:

## 2012-12-12 NOTE — Assessment & Plan Note (Signed)
BP Readings from Last 3 Encounters:  12/08/12 126/70  07/01/11 118/74  02/27/11 118/70   OK control.  Plan  refill RX

## 2012-12-12 NOTE — Assessment & Plan Note (Signed)
Refill Rx. To return for lab and exam

## 2012-12-20 ENCOUNTER — Telehealth: Payer: Self-pay | Admitting: Internal Medicine

## 2012-12-20 NOTE — Telephone Encounter (Signed)
Per patient call have a flare up from her asthma for 2 weeks alttle SOB want to know if she can be seen today , want to know what Dr Debby Bud advise.

## 2012-12-20 NOTE — Telephone Encounter (Signed)
Ok to add on - will have a wait since she needs to be here in time to register and I am booked thru 5

## 2012-12-22 ENCOUNTER — Ambulatory Visit (INDEPENDENT_AMBULATORY_CARE_PROVIDER_SITE_OTHER): Payer: Medicare PPO | Admitting: Internal Medicine

## 2012-12-22 ENCOUNTER — Encounter: Payer: Self-pay | Admitting: Internal Medicine

## 2012-12-22 VITALS — BP 144/86 | HR 56 | Temp 97.9°F | Resp 12 | Ht 67.0 in | Wt 180.4 lb

## 2012-12-22 DIAGNOSIS — Z0000 Encounter for general adult medical examination without abnormal findings: Secondary | ICD-10-CM

## 2012-12-22 DIAGNOSIS — I10 Essential (primary) hypertension: Secondary | ICD-10-CM

## 2012-12-22 DIAGNOSIS — E785 Hyperlipidemia, unspecified: Secondary | ICD-10-CM

## 2012-12-22 DIAGNOSIS — Z23 Encounter for immunization: Secondary | ICD-10-CM

## 2012-12-22 MED ORDER — EZETIMIBE-SIMVASTATIN 10-20 MG PO TABS: 1.0000 | ORAL_TABLET | Freq: Every day | ORAL | Status: DC

## 2012-12-22 MED ORDER — LISINOPRIL-HYDROCHLOROTHIAZIDE 10-12.5 MG PO TABS: ORAL_TABLET | ORAL | Status: DC

## 2012-12-22 NOTE — Patient Instructions (Addendum)
Thanks for coming back.   Your exam is fine. Your labs were in normal range.  We will give you Tetanus and pneumonia vaccine today. Please check with your insurance carrier about coverage for shingles vaccine.  You will need a mammogram in Sept of 2015 and colonoscopy in August 2017.  Come back when I can be of help to you or in 1 year.

## 2012-12-22 NOTE — Progress Notes (Signed)
Subjective:    Patient ID: Haley Mcmillan, female    DOB: Jan 13, 1934, 79 y.o.   MRN: [id number]  HPI The patient is here for annual Medicare wellness examination and management of other chronic and acute problems.   The risk factors are reflected in the social history.  The roster of all physicians providing medical care to patient - is listed in the Snapshot section of the chart.  Activities of daily living:  The patient is 100% inedpendent in all ADLs: dressing, toileting, feeding as well as independent mobility  Home safety : The patient has smoke detectors in the home. Falls - none and home is fall safe. They wear seatbelts. No firearms at home. There is no violence in the home.   There is no risks for hepatitis, STDs or HIV. There is no history of blood transfusion. They have no travel history to infectious disease endemic areas of the world.  The patient has has not seen their dentist in the last six month. They have not seen their eye doctor in the last year. They deny any hearing difficulty and have not had audiologic testing in the last year.    They do not  have excessive sun exposure. Discussed the need for sun protection: hats, long sleeves and use of sunscreen if there is significant sun exposure.   Diet: the importance of a healthy diet is discussed. They do have a relatively healthy diet, but she has a very poor appetite since her son died 2 years.  The patient has no regular exercise program.  The benefits of regular aerobic exercise were discussed.  Depression screen: there are signs of vegative symptoms of depression/grief-  change in appetite, sadness/tearfullness.  Cognitive assessment: the patient manages all their financial and personal affairs and is actively engaged.   The following portions of the patient's history were reviewed and updated as appropriate: allergies, current medications, past family history, past medical history,  past surgical history, past  social history  and problem list.  Vision, hearing, body mass index were assessed and reviewed.   During the course of the visit the patient was educated and counseled about appropriate screening and preventive services including : fall prevention , diabetes screening, nutrition counseling, colorectal cancer screening, and recommended immunizations.  Past Medical History  Diagnosis Date  . Olecranon bursitis     Elbow  . Other and unspecified hyperlipidemia   . Unspecified essential hypertension   . Palpitations    Past Surgical History  Procedure Laterality Date  . Polypectomy     Family History  Problem Relation Age of Onset  . Gallbladder disease Mother   . Aneurysm Father   . Heart disease Father     CAD  . Cancer Neg Hx     Colon  . Diabetes Neg Hx   . Heart attack Neg Hx   . Breast cancer Sister   . Breast cancer Sister    History   Social History  . Marital Status: Married    Spouse Name: N/A    Number of Children: N/A  . Years of Education: N/A   Occupational History  . retired    Social History Main Topics  . Smoking status: Never Smoker   . Smokeless tobacco: Not on file  . Alcohol Use: Yes     Comment: wine  . Drug Use: No  . Sexually Active: Not on file   Other Topics Concern  . 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             \ No current outpatient prescriptions on file prior to visit.   No current facility-administered medications on file prior to visit.       Review of Systems Constitutional:  Negative for fever, chills, activity change and unexpected weight change.  HEENT:  Negative for hearing loss, ear pain, congestion, neck stiffness and postnasal drip. Negative for sore throat or swallowing problems. Negative for  dental complaints.   Eyes: Negative for vision loss or change in visual acuity.  Respiratory: Negative for chest tightness and wheezing. Negative for DOE.   Cardiovascular: Negative for chest pain or palpitations. No decreased exercise tolerance Gastrointestinal: No change in bowel habit. No bloating or gas. No reflux or indigestion Genitourinary: Negative for urgency, frequency, flank pain and difficulty urinating.  Musculoskeletal: Negative for myalgias, back pain, arthralgias and gait problem except for pain in the right hip with walking.  Neurological: Negative for dizziness, tremors, weakness and headaches.  Hematological: Negative for adenopathy.  Psychiatric/Behavioral: Negative for behavioral problems and dysphoric mood.       Objective:   Physical Exam Filed Vitals:   12/22/12 1512  BP: 144/86  Pulse: 56  Temp: 97.9 F (36.6 C)  Resp: 12   Wt Readings from Last 3 Encounters:  12/22/12 180 lb 6.4 oz (81.829 kg)  12/08/12 180 lb 9.6 oz (81.92 kg)  07/01/11 175 lb (79.379 kg)   Gen'l: well nourished, well developed AA Woman in no distress HEENT - [place]/AT, EACs/TMs normal, oropharynx with native dentition in good condition, no buccal or palatal lesions, posterior pharynx clear, mucous membranes moist. C&S clear, PERRLA, fundi - normal Neck - supple, no thyromegaly Nodes- negative submental, cervical, supraclavicular regions Chest - no deformity, no CVAT Lungs - clear without rales, wheezes. No increased work of breathing Breast - deferred Cardiovascular - regular rate and rhythm, quiet precordium, no murmurs, rubs or gallops, 2+ radial, DP and PT pulses Abdomen - BS+ x 4, no HSM, no guarding or rebound or tenderness Pelvic - deferred to gyn Rectal - deferred to gyn Extremities - no clubbing, cyanosis, edema or deformity.  Neuro - A&O x 3, CN II-XII normal, motor strength normal and equal, DTRs 2+ and symmetrical biceps, radial, and patellar tendons. Cerebellar - no tremor,  no rigidity, fluid movement and normal gait. Derm - Head, neck, back, abdomen and extremities without suspicious lesions   Recent Results (from the past Oct 25, 2158 hour(s))  HEPATIC FUNCTION PANEL     Status: None   Collection Time    12/08/12  1:36 PM      Result Value Range   Total Bilirubin 0.4  0.3 - 1.2 mg/dL   Bilirubin, Direct 0.1  0.0 - 0.3 mg/dL   Alkaline Phosphatase 96  39 - 117 U/L   AST 27  0 - 37 U/L   ALT 21  0 - 35 U/L   Total Protein 8.3  6.0 - 8.3 g/dL   Albumin 4.3  3.5 - 5.2 g/dL  COMPREHENSIVE METABOLIC PANEL     Status: Abnormal   Collection Time    12/08/12  1:36 PM      Result Value Range   Sodium 138  135 - 145  mEq/L   Potassium 4.2  3.5 - 5.1 mEq/L   Chloride 101  96 - 112 mEq/L   CO2 27  19 - 32 mEq/L   Glucose, Bld 112 (*) 70 - 99 mg/dL   BUN 13  6 - 23 mg/dL   Creatinine, Ser 1.1  0.4 - 1.2 mg/dL   Total Bilirubin 0.4  0.3 - 1.2 mg/dL   Alkaline Phosphatase 96  39 - 117 U/L   AST 27  0 - 37 U/L   ALT 21  0 - 35 U/L   Total Protein 8.3  6.0 - 8.3 g/dL   Albumin 4.3  3.5 - 5.2 g/dL   Calcium 9.8  8.4 - 16.1 mg/dL   GFR 09.60  >45.40 mL/min  LIPID PANEL     Status: Abnormal   Collection Time    12/08/12  1:36 PM      Result Value Range   Cholesterol 159  0 - 200 mg/dL   Comment: ATP III Classification       Desirable:  < 200 mg/dL               Borderline High:  200 - 239 mg/dL          High:  > = 981 mg/dL   Triglycerides 191.4 (*) 0.0 - 149.0 mg/dL   Comment: Normal:  <782 mg/dLBorderline High:  150 - 199 mg/dL   HDL 95.62  >13.08 mg/dL   VLDL 65.7 (*) 0.0 - 84.6 mg/dL   Total CHOL/HDL Ratio 3     Comment:                Men          Women1/2 Average Risk     3.4          3.3Average Risk          5.0          4.42X Average Risk          9.6          7.13X Average Risk          15.0          11.0                      TSH     Status: None   Collection Time    12/08/12  1:36 PM      Result Value Range   TSH 1.50  0.35 - 5.50 uIU/mL  LDL  CHOLESTEROL, DIRECT     Status: None   Collection Time    12/08/12  1:36 PM      Result Value Range   Direct LDL 93.5     Comment: Optimal:  <100 mg/dLNear or Above Optimal:  100-129 mg/dLBorderline High:  130-159 mg/dLHigh:  160-189 mg/dLVery High:  >190 mg/dL          Assessment & Plan:

## 2012-12-25 DIAGNOSIS — Z0000 Encounter for general adult medical examination without abnormal findings: Secondary | ICD-10-CM | POA: Insufficient documentation

## 2012-12-25 NOTE — Assessment & Plan Note (Signed)
Taking and tolerating medication.  Lab reveals normal liver functions and LDL and HDL to be better than goal.  PLan- Continue present medication.

## 2012-12-25 NOTE — Assessment & Plan Note (Signed)
BP Readings from Last 3 Encounters:  12/22/12 144/86  12/08/12 126/70  07/01/11 118/74   General good control but a little elevated at today's visit but within guidelines per JNC 8  Plan Continue present medications

## 2013-01-25 ENCOUNTER — Other Ambulatory Visit: Payer: Self-pay | Admitting: Internal Medicine

## 2013-02-08 ENCOUNTER — Ambulatory Visit (INDEPENDENT_AMBULATORY_CARE_PROVIDER_SITE_OTHER): Payer: Medicare PPO | Admitting: Internal Medicine

## 2013-02-08 ENCOUNTER — Encounter: Payer: Self-pay | Admitting: Internal Medicine

## 2013-02-08 VITALS — BP 130/72 | HR 67 | Temp 98.1°F | Wt 182.4 lb

## 2013-02-08 DIAGNOSIS — H8309 Labyrinthitis, unspecified ear: Secondary | ICD-10-CM

## 2013-02-08 DIAGNOSIS — R55 Syncope and collapse: Secondary | ICD-10-CM

## 2013-02-08 NOTE — Patient Instructions (Addendum)
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 I suspect this was from heat and perhaps not being well hydrated. It does not sound like a neurologic event. The symptoms did clear quickly.  Vertigo - you episode is consistent with a very mild attack of "inner ear" problem that cleared quickly  Your neurologic exam today is normal: no sign of stroke or injury to the central nervous system. It is fine to go ahead with dental work with anesthesia.

## 2013-02-08 NOTE — Progress Notes (Signed)
Subjective:    Patient ID: Haley Mcmillan, female    DOB: 06/01/1934, 79 y.o.   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 It had been a hot morning and the [facility] in the car was not working.  Two days later she awoke with vertigo and the room was moving in the left field of vision. She was able to get out of bed and was able to walk. The vertigo was resolved and did not come back. No double vision, no focal weakness, no paresthesia. She had a occipital headache that resolved with tylenol.   Past Medical History  Diagnosis Date  . Olecranon bursitis     Elbow  . Other and unspecified hyperlipidemia   . Unspecified essential hypertension   . Palpitations    Past Surgical History  Procedure Laterality Date  . Polypectomy     Family History  Problem Relation Age of Onset  . Gallbladder disease Mother   . Aneurysm Father   . Heart disease Father     CAD  . Cancer Neg Hx     Colon  . Diabetes Neg Hx   . Heart attack Neg Hx   . Breast cancer Sister   . Breast cancer Sister    History   Social History  . Marital Status: Married    Spouse Name: N/A    Number of Children: N/A  . Years of Education: N/A   Occupational History  . retired    Social History Main Topics  . Smoking status: Never Smoker   . Smokeless tobacco: Not on file  . Alcohol Use: Yes     Comment: wine  . Drug Use: No  . Sexual Activity: Not on file   Other Topics Concern  . 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             Current Outpatient Prescriptions on File Prior  to Visit  Medication Sig Dispense Refill  . ezetimibe-simvastatin (VYTORIN) 10-20 MG per tablet Take 1 tablet by mouth at bedtime.  30 tablet  5  . lisinopril-hydrochlorothiazide (PRINZIDE,ZESTORETIC) 10-12.5 MG per tablet TAKE 1 TABLET BY MOUTH DAILY.  30 tablet  5   No current facility-administered medications on file prior to visit.      Review of Systems System review is negative for any constitutional, cardiac, pulmonary, GI or neuro symptoms or complaints other than as described in the HPI.     Objective:   Physical Exam Filed Vitals:   02/08/13 1617  BP: 130/72  Pulse: 67  Temp: 98.1 F (36.7 C)   Wt Readings from Last 3 Encounters:  02/08/13 182 lb 6.4 oz (82.736 kg)  12/22/12 180 lb 6.4 oz (81.829 kg)  12/08/12 180 lb 9.6 oz (81.92 kg)   BP Readings from Last 3 Encounters:  02/08/13 130/72  12/22/12 144/86  12/08/12 126/70   Gen'l - WNWD AA woman in no distress HEENT - PERRLA, Disc normals with sharp margins, mild  arterial narrowing Cor- 2+ radial RRR Pulm - normal respirations Neuro - A&O x 3 , speech clear, cognition normal. CN II-XII normal with normal facial symmetry and motion. Cerebellar - nl F-N, Heel-Shin, negative Rhomberg, normal gait and tandem gait. Normal rapid finger movement.       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 I suspect this was from heat and perhaps not being well hydrated. It does not sound like a neurologic event. The symptoms did clear quickly.

## 2013-02-11 NOTE — Assessment & Plan Note (Signed)
you episode is consistent with a very mild attack of "inner ear" problem that cleared quickly  Your neurologic exam today is normal: no sign of stroke or injury to the central nervous system. It is fine to go ahead with dental work with anesthesia.  Plan Meclizine 12. 5mg  q 6 prn - may use otc bonine

## 2013-03-23 ENCOUNTER — Other Ambulatory Visit: Payer: Self-pay | Admitting: Internal Medicine

## 2013-04-26 ENCOUNTER — Encounter: Payer: Self-pay | Admitting: Cardiology

## 2013-06-23 ENCOUNTER — Encounter: Payer: Self-pay | Admitting: Internal Medicine

## 2013-06-23 ENCOUNTER — Ambulatory Visit (INDEPENDENT_AMBULATORY_CARE_PROVIDER_SITE_OTHER): Payer: Medicare PPO | Admitting: Internal Medicine

## 2013-06-23 VITALS — BP 136/80 | HR 60 | Temp 98.5°F | Wt 180.4 lb

## 2013-06-23 DIAGNOSIS — R42 Dizziness and giddiness: Secondary | ICD-10-CM

## 2013-06-23 NOTE — Progress Notes (Signed)
Pre visit review using our clinic review tool, if applicable. No additional management support is needed unless otherwise documented below in the visit note. 

## 2013-06-23 NOTE — Patient Instructions (Signed)
Several episodes of feeling light-headed and one episode of blurred vision. All these events were very brief and do not suggest a particular diagnosis. Since there is no balance issue it is probably not a problem of the vestibular apparatus (inner ear), and it does not sound like the symptoms of a stroke. On exam you have well controlled blood pressure, minimal wax in the right ear canal, normal pulse, normal heart rate, no sound of a blockage in the carotid artery and a normal brief neurologic exam - eye movement, rapid finger movement, finger to nose and heel to shin maneuver.  If you continue to have these episodes, particularly if they last longer and/or are more severe I would recommend a neurology consult and then any imaging that they would recommend.  Please continue to take all your current medications. You may want to take a baby aspirin 3 times a week as a way to reduce risk of heart attack and stroke.

## 2013-06-23 NOTE — Progress Notes (Signed)
   Subjective:    Patient ID: Haley Mcmillan, female    DOB: 1933/10/18, 80 y.o.   MRN: [id number]  HPI Friday before christmas she had a pork chop dinner - after dinner she had an episode of feeling light-headed. Christmas morning after breakfast where she had one sausage and took a nap. When she got up she couldn't focus - vision was blurred. Episode lasted less than 5 minutes. She reports an episode of a similar nature in October '14. Duration is always brief - less than 5 minutes with no accompanying signs or symptoms. Otherwise she feels fine. She denies any problems with balance, focal weakness, or any other neurologic symptoms. Has has no known food allergies. She takes no potentially contributory medications.  PMH, FamHx and SocHx reviewed for any changes and relevance.  Current Outpatient Prescriptions on File Prior to Visit  Medication Sig Dispense Refill  . lisinopril-hydrochlorothiazide (PRINZIDE,ZESTORETIC) 10-12.5 MG per tablet TAKE 1 TABLET BY MOUTH DAILY.  30 tablet  5  . VYTORIN 10-20 MG per tablet TAKE 1 TABLET BY MOUTH AT BEDTIME.  30 tablet  5   No current facility-administered medications on file prior to visit.      Review of Systems System review is negative for any constitutional, cardiac, pulmonary, GI or neuro symptoms or complaints other than as described in the HPI.     Objective:   Physical Exam Filed Vitals:   06/23/13 1408  BP: 136/80  Pulse: 60  Temp: 98.5 F (36.9 C)   BP Readings from Last 3 Encounters:  06/23/13 136/80  02/08/13 130/72  12/22/12 144/86   Wt Readings from Last 3 Encounters:  06/23/13 180 lb 6.4 oz (81.829 kg)  02/08/13 182 lb 6.4 oz (82.736 kg)  12/22/12 180 lb 6.4 oz (81.829 kg)   Gen'l- large framed woman in no distress HEENT - C&S clear Neck - supple, no thyromegaly Cor - 2+ radial pulse, RRR w/o m/r/g, no Carotid bruits Nodes - negative supraclavicular, cervical regions Pulm - normal respirations. Neuro - A&O x 3,  CN - PERRLA, EOMI, normal facial symmetry and movement,  Normal shoulder shrug. DTRs 2+ and symmetrical. Cerebellar - no tremor, nl finger-nose, heel shin maneuver, nl gait, negative rhomberg, no pronator drift, nl rapid finger movement, no dysdiadochokinesia       Assessment & Plan:

## 2013-06-25 DIAGNOSIS — R42 Dizziness and giddiness: Secondary | ICD-10-CM | POA: Insufficient documentation

## 2013-06-25 NOTE — Assessment & Plan Note (Signed)
Several episodes of feeling light-headed and one episode of blurred vision. All these events were very brief and do not suggest a particular diagnosis. Since there is no balance issue it is probably not a problem of the vestibular apparatus (inner ear), and it does not sound like the symptoms of a stroke. On exam you have well controlled blood pressure, minimal wax in the right ear canal, normal pulse, normal heart rate, no sound of a blockage in the carotid artery and a normal brief neurologic exam - eye movement, rapid finger movement, finger to nose and heel to shin maneuver.  Plan If you continue to have these episodes, particularly if they last longer and/or are more severe I would recommend a neurology consult and then any imaging that they would recommend.  continue to take all current medications.    take a baby aspirin 3 times a week as a way to reduce risk of heart attack and stroke.

## 2013-09-28 ENCOUNTER — Other Ambulatory Visit: Payer: Self-pay

## 2013-09-28 MED ORDER — EZETIMIBE-SIMVASTATIN 10-20 MG PO TABS: ORAL_TABLET | ORAL | Status: DC

## 2013-11-21 ENCOUNTER — Other Ambulatory Visit: Payer: Self-pay | Admitting: Internal Medicine

## 2013-12-26 ENCOUNTER — Other Ambulatory Visit: Payer: Self-pay | Admitting: Internal Medicine

## 2013-12-26 ENCOUNTER — Telehealth: Payer: Self-pay | Admitting: Internal Medicine

## 2013-12-26 DIAGNOSIS — E785 Hyperlipidemia, unspecified: Secondary | ICD-10-CM

## 2013-12-26 DIAGNOSIS — I10 Essential (primary) hypertension: Secondary | ICD-10-CM

## 2013-12-26 DIAGNOSIS — Z9189 Other specified personal risk factors, not elsewhere classified: Secondary | ICD-10-CM

## 2013-12-26 MED ORDER — EZETIMIBE-SIMVASTATIN 10-20 MG PO TABS: ORAL_TABLET | ORAL | Status: DC

## 2013-12-26 MED ORDER — LISINOPRIL-HYDROCHLOROTHIAZIDE 10-12.5 MG PO TABS: ORAL_TABLET | ORAL | Status: DC

## 2013-12-26 NOTE — Telephone Encounter (Signed)
Notified pt with md response. Pt has appt with Dr. Doug Sou in Sept. Inform pt will send enough until she establish with Dr. Doug Sou...Haley Mcmillan

## 2013-12-26 NOTE — Telephone Encounter (Signed)
Pt requesting refill for vytorin and lisinopril to be into [facility]. Please advise, pt last ov was 06/23/2013 with Dr. Linda Hedges. Please advise, pt is out of this med.

## 2013-12-26 NOTE — Telephone Encounter (Signed)
OK X 1 month Needs fasting labs & F/U Ov. Orders entered

## 2013-12-28 ENCOUNTER — Other Ambulatory Visit: Payer: Self-pay

## 2013-12-28 MED ORDER — LISINOPRIL-HYDROCHLOROTHIAZIDE 10-12.5 MG PO TABS: ORAL_TABLET | ORAL | Status: DC

## 2014-01-23 ENCOUNTER — Other Ambulatory Visit: Payer: Self-pay | Admitting: *Deleted

## 2014-01-23 MED ORDER — EZETIMIBE-SIMVASTATIN 10-20 MG PO TABS: ORAL_TABLET | ORAL | Status: DC

## 2014-03-02 ENCOUNTER — Encounter: Payer: Self-pay | Admitting: Internal Medicine

## 2014-03-02 ENCOUNTER — Other Ambulatory Visit (INDEPENDENT_AMBULATORY_CARE_PROVIDER_SITE_OTHER): Payer: Medicare PPO

## 2014-03-02 ENCOUNTER — Ambulatory Visit (INDEPENDENT_AMBULATORY_CARE_PROVIDER_SITE_OTHER): Payer: Medicare PPO | Admitting: Internal Medicine

## 2014-03-02 VITALS — BP 114/70 | HR 77 | Temp 98.2°F | Ht 67.0 in | Wt 183.4 lb

## 2014-03-02 DIAGNOSIS — E785 Hyperlipidemia, unspecified: Secondary | ICD-10-CM

## 2014-03-02 DIAGNOSIS — I10 Essential (primary) hypertension: Secondary | ICD-10-CM

## 2014-03-02 DIAGNOSIS — M7020 Olecranon bursitis, unspecified elbow: Secondary | ICD-10-CM

## 2014-03-02 DIAGNOSIS — M7022 Olecranon bursitis, left elbow: Secondary | ICD-10-CM

## 2014-03-02 LAB — BASIC METABOLIC PANEL
BUN: 19 mg/dL (ref 6–23)
CHLORIDE: 105 meq/L (ref 96–112)
CO2: 23 meq/L (ref 19–32)
CREATININE: 1.2 mg/dL (ref 0.4–1.2)
Calcium: 9.5 mg/dL (ref 8.4–10.5)
GFR: 56.36 mL/min — ABNORMAL LOW (ref >60.00)
Glucose, Bld: 120 mg/dL — ABNORMAL HIGH (ref 70–99)
POTASSIUM: 3.4 meq/L — AB (ref 3.5–5.1)
Sodium: 141 mEq/L (ref 135–145)

## 2014-03-02 LAB — HEMOGLOBIN A1C: HEMOGLOBIN A1C: 6.7 % — AB (ref 4.6–6.5)

## 2014-03-02 NOTE — Progress Notes (Signed)
Pre visit review using our clinic review tool, if applicable. No additional management support is needed unless otherwise documented below in the visit note. 

## 2014-03-02 NOTE — Patient Instructions (Signed)
We will check on your sugars in the blood today and call you with the results.  Keep moving to keep your joints from getting stiff.   Come back in about 1 year or sooner if you are feeling sick.  Exercise to Stay Healthy Exercise helps you become and stay healthy. EXERCISE IDEAS AND TIPS Choose exercises that:  You enjoy.  Fit into your day. You do not need to exercise really hard to be healthy. You can do exercises at a slow or medium level and stay healthy. You can:  Stretch before and after working out.  Try yoga, Pilates, or tai chi.  Lift weights.  Walk fast, swim, jog, run, climb stairs, bicycle, dance, or rollerskate.  Take aerobic classes. Exercises that burn about 150 calories:  Running 1  miles in 15 minutes.  Playing volleyball for 45 to 60 minutes.  Washing and waxing a car for 45 to 60 minutes.  Playing touch football for 45 minutes.  Walking 1  miles in 35 minutes.  Pushing a stroller 1  miles in 30 minutes.  Playing basketball for 30 minutes.  Raking leaves for 30 minutes.  Bicycling 5 miles in 30 minutes.  Walking 2 miles in 30 minutes.  Dancing for 30 minutes.  Shoveling snow for 15 minutes.  Swimming laps for 20 minutes.  Walking up stairs for 15 minutes.  Bicycling 4 miles in 15 minutes.  Gardening for 30 to 45 minutes.  Jumping rope for 15 minutes.  Washing windows or floors for 45 to 60 minutes. Document Released: 07/05/2010 Document Revised: 08/25/2011 Document Reviewed: 07/05/2010 [facility] Patient Information 2015 [place], [place]. This information is not intended to replace advice given to you by your health care provider. Make sure you discuss any questions you have with your health care provider.

## 2014-03-03 NOTE — Assessment & Plan Note (Signed)
Check lipid panel  

## 2014-03-03 NOTE — Progress Notes (Signed)
   Subjective:    Patient ID: Haley Mcmillan, female    DOB: 1934-02-05, 80 y.o.   MRN: [id number]  HPI The patient is a 80 YO woman who is coming in today to establish care. She has PMH of HTN, hyperlipidemia. She does care for a grandson about 2-3 times per week and this gives her some elbow pain from carrying him up and down. She is not having any other complaints. She stays fairly active with her grandson. She denies chest pains or SOB. She denies abdominal pain, diarrhea, constipation.   Review of Systems  Constitutional: Negative for fever, chills, activity change, appetite change and fatigue.  HENT: Negative.   Eyes: Negative.   Respiratory: Negative for cough, chest tightness, shortness of breath and wheezing.   Cardiovascular: Negative for chest pain, palpitations and leg swelling.  Gastrointestinal: Negative for abdominal pain, diarrhea, constipation and abdominal distention.  Musculoskeletal: Positive for myalgias. Negative for gait problem.  Neurological: Negative for dizziness, weakness, light-headedness and headaches.      Objective:   Physical Exam  Constitutional: She is oriented to person, place, and time. She appears well-developed and well-nourished. No distress.  HENT:  Head: Normocephalic and atraumatic.  Eyes: EOM are normal.  Neck: Normal range of motion.  Cardiovascular: Normal rate and regular rhythm.   Pulmonary/Chest: Effort normal and breath sounds normal. No respiratory distress. She has no wheezes. She has no rales.  Abdominal: Soft. Bowel sounds are normal. She exhibits no distension. There is no tenderness. There is no rebound.  Musculoskeletal: She exhibits tenderness.  Tenderness over the medial aspect of the elbow.  Neurological: She is alert and oriented to person, place, and time. Coordination normal.  Skin: Skin is warm and dry.   Filed Vitals:   03/02/14 1124  BP: 114/70  Pulse: 77  Temp: 98.2 F (36.8 C)  TempSrc: Oral  Height: 5\' 7"   (1.702 m)  Weight: 183 lb 6.4 oz (83.19 kg)  SpO2: 99%      Assessment & Plan:

## 2014-03-03 NOTE — Assessment & Plan Note (Signed)
Check BMP and BP well controlled today. On lisinopril/hctz 10/12.5 mg daily. Filed Vitals:   03/02/14 1124  BP: 114/70  Pulse: 77  Temp: 98.2 F (36.8 C)  TempSrc: Oral  Height: 5\' 7"  (1.702 m)  Weight: 183 lb 6.4 oz (83.19 kg)  SpO2: 99%

## 2014-03-03 NOTE — Assessment & Plan Note (Signed)
Advised rest and ice if possible.

## 2014-03-22 ENCOUNTER — Telehealth: Payer: Self-pay

## 2014-03-23 ENCOUNTER — Other Ambulatory Visit: Payer: Self-pay | Admitting: Geriatric Medicine

## 2014-03-23 MED ORDER — EZETIMIBE-SIMVASTATIN 10-20 MG PO TABS: ORAL_TABLET | ORAL | Status: DC

## 2014-03-23 NOTE — Telephone Encounter (Signed)
Done

## 2014-04-23 ENCOUNTER — Other Ambulatory Visit: Payer: Self-pay | Admitting: Internal Medicine

## 2015-03-26 ENCOUNTER — Other Ambulatory Visit: Payer: Self-pay | Admitting: Internal Medicine

## 2015-04-24 ENCOUNTER — Ambulatory Visit (INDEPENDENT_AMBULATORY_CARE_PROVIDER_SITE_OTHER): Payer: Medicare PPO | Admitting: Internal Medicine

## 2015-04-24 ENCOUNTER — Encounter: Payer: Self-pay | Admitting: Internal Medicine

## 2015-04-24 ENCOUNTER — Other Ambulatory Visit (INDEPENDENT_AMBULATORY_CARE_PROVIDER_SITE_OTHER): Payer: Medicare PPO

## 2015-04-24 VITALS — BP 148/84 | HR 51 | Temp 98.4°F | Resp 16 | Ht 67.0 in | Wt 179.8 lb

## 2015-04-24 DIAGNOSIS — H6121 Impacted cerumen, right ear: Secondary | ICD-10-CM | POA: Insufficient documentation

## 2015-04-24 DIAGNOSIS — I10 Essential (primary) hypertension: Secondary | ICD-10-CM

## 2015-04-24 DIAGNOSIS — E785 Hyperlipidemia, unspecified: Secondary | ICD-10-CM

## 2015-04-24 DIAGNOSIS — H918X1 Other specified hearing loss, right ear: Secondary | ICD-10-CM

## 2015-04-24 LAB — COMPREHENSIVE METABOLIC PANEL
ALT: 14 U/L (ref 0–35)
AST: 20 U/L (ref 0–37)
Albumin: 4.3 g/dL (ref 3.5–5.2)
Alkaline Phosphatase: 99 U/L (ref 39–117)
BILIRUBIN TOTAL: 0.4 mg/dL (ref 0.2–1.2)
BUN: 15 mg/dL (ref 6–23)
CALCIUM: 10 mg/dL (ref 8.4–10.5)
CO2: 30 mEq/L (ref 19–32)
CREATININE: 1.18 mg/dL (ref 0.40–1.20)
Chloride: 105 mEq/L (ref 96–112)
GFR: 56.74 mL/min — ABNORMAL LOW (ref >60.00)
GLUCOSE: 115 mg/dL — AB (ref 70–99)
Potassium: 4.6 mEq/L (ref 3.5–5.1)
Sodium: 143 mEq/L (ref 135–145)
Total Protein: 8 g/dL (ref 6.0–8.3)

## 2015-04-24 LAB — HEMOGLOBIN A1C: Hgb A1c MFr Bld: 6.4 % (ref 4.6–6.5)

## 2015-04-24 LAB — LIPID PANEL
CHOL/HDL RATIO: 4
CHOLESTEROL: 160 mg/dL (ref 0–200)
HDL: 42.4 mg/dL (ref >39.00)
LDL CALC: 96 mg/dL (ref 0–99)
NonHDL: 117.22
Triglycerides: 108 mg/dL (ref 0.0–149.0)
VLDL: 21.6 mg/dL (ref 0.0–40.0)

## 2015-04-24 NOTE — Progress Notes (Signed)
Pre visit review using our clinic review tool, if applicable. No additional management support is needed unless otherwise documented below in the visit note. 

## 2015-04-24 NOTE — Patient Instructions (Signed)
We will check the blood work today and call you back with the results.  We have cleaned out the ear today and would recommend coming back for a physical before the end of the year so we can go over your medical conditions and make sure we are doing the right thing.

## 2015-04-24 NOTE — Assessment & Plan Note (Signed)
Irrigated today and cleared. TM normal. If still having hearing problems will be advised to have a hearing test.

## 2015-04-24 NOTE — Progress Notes (Signed)
   Subjective:    Patient ID: Haley Mcmillan, female    DOB: [birth year], 81 y.o.   MRN: [id number]  HPI The patient is a 81 YO female coming in for right ear blockage. Causing hearing loss in that ear. Going on for several months and not improving. She has tried q-tips and warm showers but no help. Denies sinus congestion or drainage. Has been blocked in the past and required cleaning out.  Review of Systems  Constitutional: Negative.   HENT: Positive for ear discharge, ear pain and hearing loss. Negative for postnasal drip, rhinorrhea, sinus pressure, sore throat and tinnitus.   Eyes: Negative.   Respiratory: Negative.   Cardiovascular: Negative.   Gastrointestinal: Negative.       Objective:   Physical Exam  Constitutional: She appears well-developed and well-nourished.  HENT:  Head: Normocephalic and atraumatic.  Left Ear: External ear normal.  Right ear obstructed, TM normal after irrigation.   Eyes: EOM are normal.  Neck: Normal range of motion.  Cardiovascular: Normal rate and regular rhythm.   Pulmonary/Chest: Effort normal and breath sounds normal.  Abdominal: Soft.   Filed Vitals:   04/24/15 1005 04/24/15 1039  BP: 150/78 148/84  Pulse: 51   Temp: 98.4 F (36.9 C)   TempSrc: Oral   Resp: 16   Height: 5\' 7"  (1.702 m)   Weight: 179 lb 12.8 oz (81.557 kg)   SpO2: 98%       Assessment & Plan:

## 2015-04-29 ENCOUNTER — Other Ambulatory Visit: Payer: Self-pay | Admitting: Internal Medicine

## 2015-05-04 ENCOUNTER — Telehealth: Payer: Self-pay

## 2015-05-04 NOTE — Telephone Encounter (Signed)
Call to explain AWV; Stated she can come in, but would for me to call her Monday and schedule;  Will fup Monday

## 2015-05-07 NOTE — Telephone Encounter (Signed)
Call to Mrs. Tanouye and agreed to come for AWV 12/1 at 9:30am

## 2015-05-17 ENCOUNTER — Ambulatory Visit (INDEPENDENT_AMBULATORY_CARE_PROVIDER_SITE_OTHER): Payer: Medicare PPO

## 2015-05-17 VITALS — BP 120/70 | Ht 67.0 in | Wt 180.5 lb

## 2015-05-17 DIAGNOSIS — Z0000 Encounter for general adult medical examination without abnormal findings: Secondary | ICD-10-CM

## 2015-05-17 NOTE — Patient Instructions (Addendum)
Haley Mcmillan , Thank you for taking time to come for your Medicare Wellness Visit. I appreciate your ongoing commitment to your health goals. Please review the following plan we discussed and let me know if I can assist you in the future.   I will try to get your eye report so Dr. Sharlet Salina can review  Will get ears checked at visit  Will discuss Dexa scan  Will fup on shingles; "not a candidate" or not due to never had the shingles;   Will discuss taking Prevnar 13   Will try to take presser vision with her meal and will try to eat regularly    These are the goals we discussed: Goals    . Have 3 meals a day     Start buying groceries and start cooking again;        This is a list of the screening recommended for you and due dates:  Health Maintenance  Topic Date Due  . Shingles Vaccine  10/06/1995  . DEXA scan (bone density measurement)  10/05/2000  . Pneumonia vaccines (2 of 2 - PCV13) 12/22/2013  . Flu Shot  04/23/2016*  . Tetanus Vaccine  12/23/2022  *Topic was postponed. The date shown is not the original due date.    Osteoporosis Osteoporosis is the thinning and loss of density in the bones. Osteoporosis makes the bones more brittle, fragile, and likely to break (fracture). Over time, osteoporosis can cause the bones to become so weak that they fracture after a simple fall. The bones most likely to fracture are the bones in the hip, wrist, and spine. CAUSES  The exact cause is not known. RISK FACTORS Anyone can develop osteoporosis. You may be at greater risk if you have a family history of the condition or have poor nutrition. You may also have a higher risk if you are:   Female.   81 years old or older.  A smoker.  Not physically active.   White or Asian.  Slender. SIGNS AND SYMPTOMS  A fracture might be the first sign of the disease, especially if it results from a fall or injury that would not usually cause a bone to break. Other signs and symptoms  include:   Low back and neck pain.  Stooped posture.  Height loss. DIAGNOSIS  To make a diagnosis, your health care provider may:  Take a medical history.  Perform a physical exam.  Order tests, such as:  A bone mineral density test.  A dual-energy X-ray absorptiometry test. TREATMENT  The goal of osteoporosis treatment is to strengthen your bones to reduce your risk of a fracture. Treatment may involve:  Making lifestyle changes, such as:  Eating a diet rich in calcium.  Doing weight-bearing and muscle-strengthening exercises.  Stopping tobacco use.  Limiting alcohol intake.  Taking medicine to slow the process of bone loss or to increase bone density.  Monitoring your levels of calcium and vitamin D. HOME CARE INSTRUCTIONS  Include calcium and vitamin D in your diet. Calcium is important for bone health, and vitamin D helps the body absorb calcium.  Perform weight-bearing and muscle-strengthening exercises as directed by your health care provider.  Do not use any tobacco products, including cigarettes, chewing tobacco, and electronic cigarettes. If you need help quitting, ask your health care provider.  Limit your alcohol intake.  Take medicines only as directed by your health care provider.  Keep all follow-up visits as directed by your health care provider. This is important.  Take precautions at home to lower your risk of falling, such as:  Keeping rooms well lit and clutter free.  Installing safety rails on stairs.  Using rubber mats in the bathroom and other areas that are often wet or slippery. SEEK IMMEDIATE MEDICAL CARE IF:  You fall or injure yourself.    This information is not intended to replace advice given to you by your health care provider. Make sure you discuss any questions you have with your health care provider.   Document Released: 03/12/2005 Document Revised: 06/23/2014 Document Reviewed: 11/10/2013 Elsevier Interactive Patient  Education 2016 [place].   Bone Densitometry Bone densitometry is an imaging test that uses a special X-ray to measure the amount of calcium and other minerals in your bones (bone density). This test is also known as a bone mineral density test or dual-energy X-ray absorptiometry (DXA). The test can measure bone density at your hip and your spine. It is similar to having a regular X-ray. You may have this test to:  Diagnose a condition that causes weak or thin bones (osteoporosis).  Predict your risk of a broken bone (fracture).  Determine how well osteoporosis treatment is working. LET [facility] CARE PROVIDER KNOW ABOUT:  Any allergies you have.  All medicines you are taking, including vitamins, herbs, eye drops, creams, and over-the-counter medicines.  Previous problems you or members of your family have had with the use of anesthetics.  Any blood disorders you have.  Previous surgeries you have had.  Medical conditions you have.  Possibility of pregnancy.  Any other medical test you had within the previous 14 days that used contrast material. RISKS AND COMPLICATIONS Generally, this is a safe procedure. However, problems can occur and may include the following:  This test exposes you to a very small amount of radiation.  The risks of radiation exposure may be greater to unborn children. BEFORE THE PROCEDURE  Do not take any calcium supplements for 24 hours before having the test. You can otherwise eat and drink what you usually do.  Take off all metal jewelry, eyeglasses, dental appliances, and any other metal objects. PROCEDURE  You may lie on an exam table. There will be an X-ray generator below you and an imaging device above you.  Other devices, such as boxes or braces, may be used to position your body properly for the scan.  You will need to lie still while the machine slowly scans your body.  The images will show up on a computer monitor. AFTER THE  PROCEDURE You may need more testing at a later time.   This information is not intended to replace advice given to you by your health care provider. Make sure you discuss any questions you have with your health care provider.   Document Released: 06/24/2004 Document Revised: 06/23/2014 Document Reviewed: 11/10/2013 Elsevier Interactive Patient Education 2016 [place] in the Home  Falls can cause injuries. They can happen to people of all ages. There are many things you can do to make your home safe and to help prevent falls.  WHAT CAN I DO ON THE OUTSIDE OF MY HOME?  Regularly fix the edges of walkways and driveways and fix any cracks.  Remove anything that might make you trip as you walk through a door, such as a raised step or threshold.  Trim any bushes or trees on the path to your home.  Use bright outdoor lighting.  Clear any walking paths of anything that  might make someone trip, such as rocks or tools.  Regularly check to see if handrails are loose or broken. Make sure that both sides of any steps have handrails.  Any raised decks and porches should have guardrails on the edges.  Have any leaves, snow, or ice cleared regularly.  Use sand or salt on walking paths during winter.  Clean up any spills in your garage right away. This includes oil or grease spills. WHAT CAN I DO IN THE BATHROOM?   Use night lights.  Install grab bars by the toilet and in the tub and shower. Do not use towel bars as grab bars.  Use non-skid mats or decals in the tub or shower.  If you need to sit down in the shower, use a plastic, non-slip stool.  Keep the floor dry. Clean up any water that spills on the floor as soon as it happens.  Remove soap buildup in the tub or shower regularly.  Attach bath mats securely with double-sided non-slip rug tape.  Do not have throw rugs and other things on the floor that can make you trip. WHAT CAN I DO IN THE BEDROOM?  Use  night lights.  Make sure that you have a light by your bed that is easy to reach.  Do not use any sheets or blankets that are too big for your bed. They should not hang down onto the floor.  Have a firm chair that has side arms. You can use this for support while you get dressed.  Do not have throw rugs and other things on the floor that can make you trip. WHAT CAN I DO IN THE KITCHEN?  Clean up any spills right away.  Avoid walking on wet floors.  Keep items that you use a lot in easy-to-reach places.  If you need to reach something above you, use a strong step stool that has a grab bar.  Keep electrical cords out of the way.  Do not use floor polish or wax that makes floors slippery. If you must use wax, use non-skid floor wax.  Do not have throw rugs and other things on the floor that can make you trip. WHAT CAN I DO WITH MY STAIRS?  Do not leave any items on the stairs.  Make sure that there are handrails on both sides of the stairs and use them. Fix handrails that are broken or loose. Make sure that handrails are as long as the stairways.  Check any carpeting to make sure that it is firmly attached to the stairs. Fix any carpet that is loose or worn.  Avoid having throw rugs at the top or bottom of the stairs. If you do have throw rugs, attach them to the floor with carpet tape.  Make sure that you have a light switch at the top of the stairs and the bottom of the stairs. If you do not have them, ask someone to add them for you. WHAT ELSE CAN I DO TO HELP PREVENT FALLS?  Wear shoes that:  Do not have high heels.  Have rubber bottoms.  Are comfortable and fit you well.  Are closed at the toe. Do not wear sandals.  If you use a stepladder:  Make sure that it is fully opened. Do not climb a closed stepladder.  Make sure that both sides of the stepladder are locked into place.  Ask someone to hold it for you, if possible.  Clearly mark and make sure that you  can see:  Any grab bars or handrails.  First and last steps.  Where the edge of each step is.  Use tools that help you move around (mobility aids) if they are needed. These include:  Canes.  Walkers.  Scooters.  Crutches.  Turn on the lights when you go into a dark area. Replace any light bulbs as soon as they burn out.  Set up your furniture so you have a clear path. Avoid moving your furniture around.  If any of your floors are uneven, fix them.  If there are any pets around you, be aware of where they are.  Review your medicines with your doctor. Some medicines can make you feel dizzy. This can increase your chance of falling. Ask your doctor what other things that you can do to help prevent falls.   This information is not intended to replace advice given to you by your health care provider. Make sure you discuss any questions you have with your health care provider.   Document Released: 03/29/2009 Document Revised: 10/17/2014 Document Reviewed: 07/07/2014 Elsevier Interactive Patient Education 2016 [place] Maintenance, Female Adopting a healthy lifestyle and getting preventive care can go a long way to promote health and wellness. Talk with your health care provider about what schedule of regular examinations is right for you. This is a good chance for you to check in with your provider about disease prevention and staying healthy. In between checkups, there are plenty of things you can do on your own. Experts have done a lot of research about which lifestyle changes and preventive measures are most likely to keep you healthy. Ask your health care provider for more information. WEIGHT AND DIET  Eat a healthy diet  Be sure to include plenty of vegetables, fruits, low-fat dairy products, and lean protein.  Do not eat a lot of foods high in solid fats, added sugars, or salt.  Get regular exercise. This is one of the most important things you can do for  your health.  Most adults should exercise for at least 150 minutes each week. The exercise should increase your heart rate and make you sweat (moderate-intensity exercise).  Most adults should also do strengthening exercises at least twice a week. This is in addition to the moderate-intensity exercise.  Maintain a healthy weight  Body mass index (BMI) is a measurement that can be used to identify possible weight problems. It estimates body fat based on height and weight. Your health care provider can help determine your BMI and help you achieve or maintain a healthy weight.  For females 11 years of age and older:   A BMI below 18.5 is considered underweight.  A BMI of 18.5 to 24.9 is normal.  A BMI of 25 to 29.9 is considered overweight.  A BMI of 30 and above is considered obese.  Watch levels of cholesterol and blood lipids  You should start having your blood tested for lipids and cholesterol at 79 years of age, then have this test every 5 years.  You may need to have your cholesterol levels checked more often if:  Your lipid or cholesterol levels are high.  You are older than 79 years of age.  You are at high risk for heart disease.  CANCER SCREENING   Lung Cancer  Lung cancer screening is recommended for adults 64-58 years old who are at high risk for lung cancer because of a history of smoking.  A yearly low-dose CT scan  of the lungs is recommended for people who:  Currently smoke.  Have quit within the past 15 years.  Have at least a 30-pack-year history of smoking. A pack year is smoking an average of one pack of cigarettes a day for 1 year.  Yearly screening should continue until it has been 15 years since you quit.  Yearly screening should stop if you develop a health problem that would prevent you from having lung cancer treatment.  Breast Cancer  Practice breast self-awareness. This means understanding how your breasts normally appear and feel.  It  also means doing regular breast self-exams. Let your health care provider know about any changes, no matter how small.  If you are in your 20s or 30s, you should have a clinical breast exam (CBE) by a health care provider every 1-3 years as part of a regular health exam.  If you are 60 or older, have a CBE every year. Also consider having a breast X-ray (mammogram) every year.  If you have a family history of breast cancer, talk to your health care provider about genetic screening.  If you are at high risk for breast cancer, talk to your health care provider about having an MRI and a mammogram every year.  Breast cancer gene (BRCA) assessment is recommended for women who have family members with BRCA-related cancers. BRCA-related cancers include:  Breast.  Ovarian.  Tubal.  Peritoneal cancers.  Results of the assessment will determine the need for genetic counseling and BRCA1 and BRCA2 testing. Cervical Cancer Your health care provider may recommend that you be screened regularly for cancer of the pelvic organs (ovaries, uterus, and vagina). This screening involves a pelvic examination, including checking for microscopic changes to the surface of your cervix (Pap test). You may be encouraged to have this screening done every 3 years, beginning at age 62.  For women ages 1-65, health care providers may recommend pelvic exams and Pap testing every 3 years, or they may recommend the Pap and pelvic exam, combined with testing for human papilloma virus (HPV), every 5 years. Some types of HPV increase your risk of cervical cancer. Testing for HPV may also be done on women of any age with unclear Pap test results.  Other health care providers may not recommend any screening for nonpregnant women who are considered low risk for pelvic cancer and who do not have symptoms. Ask your health care provider if a screening pelvic exam is right for you.  If you have had past treatment for cervical cancer  or a condition that could lead to cancer, you need Pap tests and screening for cancer for at least 20 years after your treatment. If Pap tests have been discontinued, your risk factors (such as having a new sexual partner) need to be reassessed to determine if screening should resume. Some women have medical problems that increase the chance of getting cervical cancer. In these cases, your health care provider may recommend more frequent screening and Pap tests. Colorectal Cancer  This type of cancer can be detected and often prevented.  Routine colorectal cancer screening usually begins at 79 years of age and continues through 79 years of age.  Your health care provider may recommend screening at an earlier age if you have risk factors for colon cancer.  Your health care provider may also recommend using home test kits to check for hidden blood in the stool.  A small camera at the end of a tube can be used to  examine your colon directly (sigmoidoscopy or colonoscopy). This is done to check for the earliest forms of colorectal cancer.  Routine screening usually begins at age 92.  Direct examination of the colon should be repeated every 5-10 years through 79 years of age. However, you may need to be screened more often if early forms of precancerous polyps or small growths are found. Skin Cancer  Check your skin from head to toe regularly.  Tell your health care provider about any new moles or changes in moles, especially if there is a change in a mole's shape or color.  Also tell your health care provider if you have a mole that is larger than the size of a pencil eraser.  Always use sunscreen. Apply sunscreen liberally and repeatedly throughout the day.  Protect yourself by wearing long sleeves, pants, a wide-brimmed hat, and sunglasses whenever you are outside. HEART DISEASE, DIABETES, AND HIGH BLOOD PRESSURE   High blood pressure causes heart disease and increases the risk of stroke.  High blood pressure is more likely to develop in:  People who have blood pressure in the high end of the normal range (130-139/85-89 mm Hg).  People who are overweight or obese.  People who are African American.  If you are 40-1 years of age, have your blood pressure checked every 3-5 years. If you are 34 years of age or older, have your blood pressure checked every year. You should have your blood pressure measured twice--once when you are at a hospital or clinic, and once when you are not at a hospital or clinic. Record the average of the two measurements. To check your blood pressure when you are not at a hospital or clinic, you can use:  An automated blood pressure machine at a pharmacy.  A home blood pressure monitor.  If you are between 8 years and 46 years old, ask your health care provider if you should take aspirin to prevent strokes.  Have regular diabetes screenings. This involves taking a blood sample to check your fasting blood sugar level.  If you are at a normal weight and have a low risk for diabetes, have this test once every three years after 79 years of age.  If you are overweight and have a high risk for diabetes, consider being tested at a younger age or more often. PREVENTING INFECTION  Hepatitis B  If you have a higher risk for hepatitis B, you should be screened for this virus. You are considered at high risk for hepatitis B if:  You were born in a country where hepatitis B is common. Ask your health care provider which countries are considered high risk.  Your parents were born in a high-risk country, and you have not been immunized against hepatitis B (hepatitis B vaccine).  You have HIV or AIDS.  You use needles to inject street drugs.  You live with someone who has hepatitis B.  You have had sex with someone who has hepatitis B.  You get hemodialysis treatment.  You take certain medicines for conditions, including cancer, organ transplantation,  and autoimmune conditions. Hepatitis C  Blood testing is recommended for:  Everyone born from [birth year] through 1963.  Anyone with known risk factors for hepatitis C. Sexually transmitted infections (STIs)  You should be screened for sexually transmitted infections (STIs) including gonorrhea and chlamydia if:  You are sexually active and are younger than 79 years of age.  You are older than 79 years of age and your health  care provider tells you that you are at risk for this type of infection.  Your sexual activity has changed since you were last screened and you are at an increased risk for chlamydia or gonorrhea. Ask your health care provider if you are at risk.  If you do not have HIV, but are at risk, it may be recommended that you take a prescription medicine daily to prevent HIV infection. This is called pre-exposure prophylaxis (PrEP). You are considered at risk if:  You are sexually active and do not regularly use condoms or know the HIV status of your partner(s).  You take drugs by injection.  You are sexually active with a partner who has HIV. Talk with your health care provider about whether you are at high risk of being infected with HIV. If you choose to begin PrEP, you should first be tested for HIV. You should then be tested every 3 months for as long as you are taking PrEP.  PREGNANCY   If you are premenopausal and you may become pregnant, ask your health care provider about preconception counseling.  If you may become pregnant, take 400 to 800 micrograms (mcg) of folic acid every day.  If you want to prevent pregnancy, talk to your health care provider about birth control (contraception). OSTEOPOROSIS AND MENOPAUSE   Osteoporosis is a disease in which the bones lose minerals and strength with aging. This can result in serious bone fractures. Your risk for osteoporosis can be identified using a bone density scan.  If you are 43 years of age or older, or if you are at  risk for osteoporosis and fractures, ask your health care provider if you should be screened.  Ask your health care provider whether you should take a calcium or vitamin D supplement to lower your risk for osteoporosis.  Menopause may have certain physical symptoms and risks.  Hormone replacement therapy may reduce some of these symptoms and risks. Talk to your health care provider about whether hormone replacement therapy is right for you.  HOME CARE INSTRUCTIONS   Schedule regular health, dental, and eye exams.  Stay current with your immunizations.   Do not use any tobacco products including cigarettes, chewing tobacco, or electronic cigarettes.  If you are pregnant, do not drink alcohol.  If you are breastfeeding, limit how much and how often you drink alcohol.  Limit alcohol intake to no more than 1 drink per day for nonpregnant women. One drink equals 12 ounces of beer, 5 ounces of wine, or 1 ounces of hard liquor.  Do not use street drugs.  Do not share needles.  Ask your health care provider for help if you need support or information about quitting drugs.  Tell your health care provider if you often feel depressed.  Tell your health care provider if you have ever been abused or do not feel safe at home.   This information is not intended to replace advice given to you by your health care provider. Make sure you discuss any questions you have with your health care provider.   Document Released: 12/16/2010 Document Revised: 06/23/2014 Document Reviewed: 05/04/2013 Elsevier Interactive Patient Education [employer].
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
## 2015-05-17 NOTE — Progress Notes (Signed)
Medical screening examination/treatment/procedure(s) were performed by non-physician practitioner and as supervising physician I was immediately available for consultation/collaboration. I agree with above. Jordyn Hofacker A Shayan Bramhall, MD 

## 2015-05-23 ENCOUNTER — Ambulatory Visit (INDEPENDENT_AMBULATORY_CARE_PROVIDER_SITE_OTHER): Payer: Medicare PPO | Admitting: Internal Medicine

## 2015-05-23 ENCOUNTER — Ambulatory Visit (INDEPENDENT_AMBULATORY_CARE_PROVIDER_SITE_OTHER)
Admission: RE | Admit: 2015-05-23 | Discharge: 2015-05-23 | Disposition: A | Discharge status: Home / Self Care | Payer: Medicare PPO | Source: Ambulatory Visit | Attending: Internal Medicine | Admitting: Internal Medicine

## 2015-05-23 ENCOUNTER — Encounter: Payer: Self-pay | Admitting: Internal Medicine

## 2015-05-23 VITALS — BP 164/72 | HR 68 | Temp 98.6°F | Resp 18 | Ht 67.0 in | Wt 181.1 lb

## 2015-05-23 DIAGNOSIS — E785 Hyperlipidemia, unspecified: Secondary | ICD-10-CM | POA: Diagnosis not present

## 2015-05-23 DIAGNOSIS — Z780 Asymptomatic menopausal state: Secondary | ICD-10-CM

## 2015-05-23 DIAGNOSIS — Z23 Encounter for immunization: Secondary | ICD-10-CM

## 2015-05-23 DIAGNOSIS — I10 Essential (primary) hypertension: Secondary | ICD-10-CM

## 2015-05-23 DIAGNOSIS — Z0000 Encounter for general adult medical examination without abnormal findings: Secondary | ICD-10-CM | POA: Diagnosis not present

## 2015-05-23 NOTE — Progress Notes (Signed)
Pre visit review using our clinic review tool, if applicable. No additional management support is needed unless otherwise documented below in the visit note. 

## 2015-05-23 NOTE — Patient Instructions (Signed)
We will give you the pneumonia shot and get the bone density done today.   No changes to your medicine today.   Exercising to Stay Healthy Exercising regularly is important. It has many health benefits, such as:  Improving your overall fitness, flexibility, and endurance.  Increasing your bone density.  Helping with weight control.  Decreasing your body fat.  Increasing your muscle strength.  Reducing stress and tension.  Improving your overall health. In order to become healthy and stay healthy, it is recommended that you do moderate-intensity and vigorous-intensity exercise. You can tell that you are exercising at a moderate intensity if you have a higher heart rate and faster breathing, but you are still able to hold a conversation. You can tell that you are exercising at a vigorous intensity if you are breathing much harder and faster and cannot hold a conversation while exercising. HOW OFTEN SHOULD I EXERCISE? Choose an activity that you enjoy and set realistic goals. Your health care provider can help you to make an activity plan that works for you. Exercise regularly as directed by your health care provider. This may include:   Doing resistance training twice each week, such as:  Push-ups.  Sit-ups.  Lifting weights.  Using resistance bands.  Doing a given intensity of exercise for a given amount of time. Choose from these options:  150 minutes of moderate-intensity exercise every week.  75 minutes of vigorous-intensity exercise every week.  A mix of moderate-intensity and vigorous-intensity exercise every week. Children, pregnant women, people who are out of shape, people who are overweight, and older adults may need to consult a health care provider for individual recommendations. If you have any sort of medical condition, be sure to consult your health care provider before starting a new exercise program.  WHAT ARE SOME EXERCISE IDEAS? Some moderate-intensity  exercise ideas include:   Walking at a rate of 1 mile in 15 minutes.  Biking.  Hiking.  Golfing.  Dancing. Some vigorous-intensity exercise ideas include:   Walking at a rate of at least 4.5 miles per hour.  Jogging or running at a rate of 5 miles per hour.  Biking at a rate of at least 10 miles per hour.  Lap swimming.  Roller-skating or in-line skating.  Cross-country skiing.  Vigorous competitive sports, such as football, basketball, and soccer.  Jumping rope.  Aerobic dancing. WHAT ARE SOME EVERYDAY ACTIVITIES THAT CAN HELP ME TO GET EXERCISE?  Yard work, such as:  Psychologist, educational.  Raking and bagging leaves.  Washing and waxing your car.  Pushing a stroller.  Shoveling snow.  Gardening.  Washing windows or floors. HOW CAN I BE MORE ACTIVE IN MY DAY-TO-DAY ACTIVITIES?  Use the stairs instead of the elevator.  Take a walk during your lunch break.  If you drive, park your car farther away from work or school.  If you take public transportation, get off one stop early and walk the rest of the way.  Make all of your phone calls while standing up and walking around.  Get up, stretch, and walk around every 30 minutes throughout the day. WHAT GUIDELINES SHOULD I FOLLOW WHILE EXERCISING?  Do not exercise so much that you hurt yourself, feel dizzy, or get very short of breath.  Consult your health care provider before starting a new exercise program.  Wear comfortable clothes and shoes with good support.  Drink plenty of water while you exercise to prevent dehydration or heat stroke. Body water  Body water is lost during exercise and must be replaced.  Work out until you breathe faster and your heart beats faster.   This information is not intended to replace advice given to you by your health care provider. Make sure you discuss any questions you have with your health care provider.   Document Released: 07/05/2010 Document Revised: 06/23/2014 Document  Reviewed: 11/03/2013 Elsevier Interactive Patient Education 2016 Elsevier Inc.  

## 2015-05-26 NOTE — Assessment & Plan Note (Signed)
Checking lipid panel and adjust as indicated. Currently on vytorin without side effects.

## 2015-05-26 NOTE — Progress Notes (Signed)
   Subjective:    Patient ID: Haley Mcmillan, female    DOB: May 21, 1934, 81 y.o.   MRN: [id number]  HPI Here for medicare wellness, no new complaints. Please see A/P for status and treatment of chronic medical problems.   Diet: heart healthy Physical activity: sedentary Depression/mood screen: negative Hearing: intact to whispered voice, some mild loss Visual acuity: grossly normal, performs annual eye exam  ADLs: capable Fall risk: none Home safety: good Cognitive evaluation: intact to orientation, naming, recall and repetition EOL planning: adv directives discussed  I have personally reviewed and have noted 1. The patient's medical and social history - reviewed today no changes 2. Their use of alcohol, tobacco or illicit drugs 3. Their current medications and supplements 4. The patient's functional ability including ADL's, fall risks, home safety risks and hearing or visual impairment. 5. Diet and physical activities 6. Evidence for depression or mood disorders 7. Care team reviewed and updated (available in snapshot)  Review of Systems  Constitutional: Negative.   HENT: Negative for ear discharge, ear pain, hearing loss, postnasal drip, rhinorrhea, sinus pressure, sore throat and tinnitus.   Eyes: Negative.   Respiratory: Negative for cough, chest tightness, shortness of breath and wheezing.   Cardiovascular: Negative for chest pain, palpitations and leg swelling.  Gastrointestinal: Negative for nausea, abdominal pain, diarrhea, constipation and abdominal distention.  Musculoskeletal: Negative.   Skin: Negative.   Neurological: Negative.   Psychiatric/Behavioral: Negative.       Objective:   Physical Exam  Constitutional: She is oriented to person, place, and time. She appears well-developed and well-nourished. No distress.  HENT:  Head: Normocephalic and atraumatic.  Eyes: EOM are normal.  Neck: Normal range of motion.  Cardiovascular: Normal rate and regular  rhythm.   Pulmonary/Chest: Effort normal and breath sounds normal. No respiratory distress. She has no wheezes. She has no rales.  Abdominal: Soft. Bowel sounds are normal. She exhibits no distension. There is no tenderness. There is no rebound.  Musculoskeletal: She exhibits no edema.  Neurological: She is alert and oriented to person, place, and time. Coordination normal.  Skin: Skin is warm and dry.   Filed Vitals:   05/23/15 1107  BP: 164/72  Pulse: 68  Temp: 98.6 F (37 C)  TempSrc: Oral  Resp: 18  Height: 5\' 7"  (1.702 m)  Weight: 181 lb 1.9 oz (82.155 kg)  SpO2: 96%      Assessment & Plan:

## 2015-05-26 NOTE — Assessment & Plan Note (Signed)
Immunizations up to date, colonoscopy and mammogram up to date. Bone density done recently. Exercises some and counseled about more regular activity. She does enough brain activities and counseled to continue. 10 year screening recommendations given to her at visit.

## 2015-05-26 NOTE — Assessment & Plan Note (Signed)
BP generally well controlled and recheck was more normal (was not documented). She will check at home and if still elevated return for a visit. There is still room to increase her medicine. Checking CMP today.

## 2016-01-03 ENCOUNTER — Encounter: Payer: Self-pay | Admitting: Gastroenterology

## 2016-03-04 ENCOUNTER — Encounter: Payer: Self-pay | Admitting: Internal Medicine

## 2016-03-04 ENCOUNTER — Ambulatory Visit (INDEPENDENT_AMBULATORY_CARE_PROVIDER_SITE_OTHER): Payer: Medicare Other | Admitting: Internal Medicine

## 2016-03-04 ENCOUNTER — Other Ambulatory Visit (INDEPENDENT_AMBULATORY_CARE_PROVIDER_SITE_OTHER): Payer: Medicare Other

## 2016-03-04 VITALS — BP 118/58 | HR 70 | Temp 98.5°F | Resp 14 | Ht 67.0 in | Wt 166.1 lb

## 2016-03-04 DIAGNOSIS — R634 Abnormal weight loss: Secondary | ICD-10-CM

## 2016-03-04 LAB — CBC
HCT: 28.5 % — ABNORMAL LOW (ref 36.0–46.0)
Hemoglobin: 9.6 g/dL — ABNORMAL LOW (ref 12.0–15.0)
MCHC: 33.9 g/dL (ref 30.0–36.0)
MCV: 93.3 fl (ref 78.0–100.0)
PLATELETS: 272 10*3/uL (ref 150.0–400.0)
RBC: 3.06 Mil/uL — AB (ref 3.87–5.11)
RDW: 14 % (ref 11.5–15.5)
WBC: 10.2 10*3/uL (ref 4.0–10.5)

## 2016-03-04 LAB — T4, FREE: FREE T4: 0.69 ng/dL (ref 0.60–1.60)

## 2016-03-04 LAB — VITAMIN D 25 HYDROXY (VIT D DEFICIENCY, FRACTURES): VITD: 25.37 ng/mL — ABNORMAL LOW (ref 30.00–100.00)

## 2016-03-04 LAB — COMPREHENSIVE METABOLIC PANEL
ALK PHOS: 74 U/L (ref 39–117)
ALT: 8 U/L (ref 0–35)
AST: 18 U/L (ref 0–37)
Albumin: 3.3 g/dL — ABNORMAL LOW (ref 3.5–5.2)
BILIRUBIN TOTAL: 0.3 mg/dL (ref 0.2–1.2)
BUN: 36 mg/dL — ABNORMAL HIGH (ref 6–23)
CALCIUM: 9 mg/dL (ref 8.4–10.5)
CO2: 33 mEq/L — ABNORMAL HIGH (ref 19–32)
Chloride: 105 mEq/L (ref 96–112)
Creatinine, Ser: 1.16 mg/dL (ref 0.40–1.20)
GFR: 57.75 mL/min — AB (ref >60.00)
Glucose, Bld: 107 mg/dL — ABNORMAL HIGH (ref 70–99)
Potassium: 4.8 mEq/L (ref 3.5–5.1)
Sodium: 141 mEq/L (ref 135–145)
TOTAL PROTEIN: 6.9 g/dL (ref 6.0–8.3)

## 2016-03-04 LAB — TSH: TSH: 0.92 u[IU]/mL (ref 0.35–4.50)

## 2016-03-04 LAB — VITAMIN B12: VITAMIN B 12: 430 pg/mL (ref 211–911)

## 2016-03-04 NOTE — Patient Instructions (Signed)
We are checking labs today and call you back with the results.  We want you to start doing a food diary to keep track of the calories so that we can make sure you are eating enough. Your goal is 2000-2500 calories per day.

## 2016-03-04 NOTE — Assessment & Plan Note (Signed)
Checking TSH, free T4, CBC, CMP, B12, vitamin D for signs of cause. It sounds like she stopped eating well and lost some weight. She is now at her ideal weight and wants to maintain here. Will see her back in about 1-2 months to check on the weight and she will keep a food diary to make sure she is eating enough calories. Up to date on colonoscopy (last 2007) and mammogram.

## 2016-03-04 NOTE — Progress Notes (Signed)
Pre visit review using our clinic review tool, if applicable. No additional management support is needed unless otherwise documented below in the visit note. 

## 2016-03-04 NOTE — Progress Notes (Signed)
   Subjective:    Patient ID: Haley Mcmillan, female    DOB: Apr 20, 1934, 82 y.o.   MRN: [id number]  HPI The patient is an 82 YO female coming in for low energy and weight loss. Started back in May when she got a puppy and spent a lot of time training it and neglected her own needs and was not eating much. She noticed the low energy more in the last 2 weeks and this brought her in to the doctor. Weight is down 15 pounds since May gradually. She has been trying to eat more in the last 1-2 weeks as she was told she was not eating enough. She was not eating but 1-2 meals per day which were small. Now she is making sure to eat breakfast and regular meals. She is able to wake up feeling rested but the energy does not last during the day. She is sleeping well and denies depression symptoms.   Review of Systems  Constitutional: Positive for activity change, appetite change, fatigue and unexpected weight change. Negative for chills and fever.  HENT: Negative for ear discharge, ear pain, hearing loss, postnasal drip, rhinorrhea, sinus pressure, sore throat and tinnitus.   Eyes: Negative.   Respiratory: Negative for cough, chest tightness, shortness of breath and wheezing.   Cardiovascular: Negative for chest pain, palpitations and leg swelling.  Gastrointestinal: Negative for abdominal distention, abdominal pain, constipation, diarrhea and nausea.  Musculoskeletal: Negative.   Skin: Negative.   Neurological: Negative.   Psychiatric/Behavioral: Negative.       Objective:   Physical Exam  Constitutional: She is oriented to person, place, and time. She appears well-developed and well-nourished.  HENT:  Head: Normocephalic and atraumatic.  Eyes: EOM are normal.  Neck: Normal range of motion.  Cardiovascular: Normal rate and regular rhythm.   Pulmonary/Chest: Effort normal and breath sounds normal. No respiratory distress. She has no wheezes. She has no rales.  Abdominal: Soft. Bowel sounds are  normal. She exhibits no distension. There is no tenderness. There is no rebound.  Musculoskeletal: She exhibits no edema.  Neurological: She is alert and oriented to person, place, and time. Coordination normal.  Skin: Skin is warm and dry.   Vitals:   03/04/16 0927 03/04/16 0937 03/04/16 0938  BP: (!) 118/58    Pulse: 70    Resp: 14    Temp: 98.5 F (36.9 C)    TempSrc: Oral    SpO2: 94%    Weight: (P) 166 lb 1.9 oz (75.4 kg) 166 lb 1.9 oz (75.4 kg)   Height: (P) 5\' 7"  (1.702 m)  5\' 7"  (1.702 m)      Assessment & Plan:

## 2016-03-05 ENCOUNTER — Other Ambulatory Visit: Payer: Self-pay | Admitting: Internal Medicine

## 2016-03-05 ENCOUNTER — Telehealth: Payer: Self-pay | Admitting: Internal Medicine

## 2016-03-05 DIAGNOSIS — D649 Anemia, unspecified: Secondary | ICD-10-CM

## 2016-03-05 DIAGNOSIS — R634 Abnormal weight loss: Secondary | ICD-10-CM

## 2016-03-05 NOTE — Telephone Encounter (Signed)
The only thing we asked her to do is keep a food diary to make sure she is getting enough calories. She did have low vitamin D so she can start taking vitamin D 5000 units over the counter daily to help with energy.

## 2016-03-05 NOTE — Telephone Encounter (Signed)
Pt was wondering if Dr. Sharlet Salina can give her something to help boost up her energy so she can go get the stuff that Dr. Sharlet Salina recommend her to do from last ov. Please call her back

## 2016-03-05 NOTE — Telephone Encounter (Signed)
Patient will keep a food diary and will start taking Vit D 5000 units daily.

## 2016-03-07 ENCOUNTER — Other Ambulatory Visit (INDEPENDENT_AMBULATORY_CARE_PROVIDER_SITE_OTHER): Payer: Medicare Other

## 2016-03-07 ENCOUNTER — Ambulatory Visit (INDEPENDENT_AMBULATORY_CARE_PROVIDER_SITE_OTHER): Payer: Medicare Other | Admitting: Internal Medicine

## 2016-03-07 ENCOUNTER — Telehealth: Payer: Self-pay | Admitting: Internal Medicine

## 2016-03-07 ENCOUNTER — Encounter: Payer: Self-pay | Admitting: Internal Medicine

## 2016-03-07 VITALS — BP 108/58 | HR 46 | Temp 98.3°F | Resp 12 | Ht 67.0 in | Wt 163.0 lb

## 2016-03-07 DIAGNOSIS — R002 Palpitations: Secondary | ICD-10-CM

## 2016-03-07 LAB — CBC
HCT: 24.8 % — ABNORMAL LOW (ref 36.0–46.0)
Hemoglobin: 8.3 g/dL — ABNORMAL LOW (ref 12.0–15.0)
MCHC: 33.6 g/dL (ref 30.0–36.0)
MCV: 93.6 fl (ref 78.0–100.0)
PLATELETS: 322 10*3/uL (ref 150.0–400.0)
RBC: 2.65 Mil/uL — ABNORMAL LOW (ref 3.87–5.11)
RDW: 13.9 % (ref 11.5–15.5)
WBC: 13.1 10*3/uL — ABNORMAL HIGH (ref 4.0–10.5)

## 2016-03-07 LAB — TROPONIN I: TNIDX: 0.01 ug/L (ref 0.00–0.06)

## 2016-03-07 NOTE — Progress Notes (Signed)
Pre visit review using our clinic review tool, if applicable. No additional management support is needed unless otherwise documented below in the visit note. 

## 2016-03-07 NOTE — Progress Notes (Signed)
   Subjective:    Patient ID: Haley Mcmillan, female    DOB: 05-Jun-1934, 82 y.o.   MRN: [id number]  HPI The patient is an 82 YO female coming in for heart palpitations. They come during exertion. Not present now. Last for about 5 minutes after exertion before they stop. She has noticed them rarely in the last weeks but more in the last 2-3 days. Eating more spinach to help with her iron levels. Noticed an episode of dark stool yesterday. Denies chest pain or pressure with walking or during palpitations.  Typical day of food: 3 toast with butter, 2 eggs, OJ, green tea Spinach, 1/2 baked potato, [place] burger, wheat bread Crackers and cheese, 1 ensure  Review of Systems  Constitutional: Positive for activity change, appetite change, fatigue and unexpected weight change. Negative for chills and fever.  HENT: Negative for ear discharge, ear pain, hearing loss, postnasal drip, rhinorrhea, sinus pressure, sore throat and tinnitus.   Eyes: Negative.   Respiratory: Negative for cough, chest tightness, shortness of breath and wheezing.   Cardiovascular: Positive for palpitations. Negative for chest pain and leg swelling.  Gastrointestinal: Negative for abdominal distention, abdominal pain, constipation, diarrhea and nausea.  Musculoskeletal: Negative.   Skin: Negative.   Neurological: Negative.   Psychiatric/Behavioral: Negative.       Objective:   Physical Exam  Constitutional: She is oriented to person, place, and time. She appears well-developed and well-nourished.  HENT:  Head: Normocephalic and atraumatic.  Eyes: EOM are normal.  Neck: Normal range of motion.  Cardiovascular: Normal rate and regular rhythm.   Pulmonary/Chest: Effort normal and breath sounds normal. No respiratory distress. She has no wheezes. She has no rales.  Abdominal: Soft. Bowel sounds are normal. She exhibits no distension. There is no tenderness. There is no rebound.  Musculoskeletal: She exhibits no edema.    Neurological: She is alert and oriented to person, place, and time. Coordination normal.  Skin: Skin is warm and dry.   Vitals:   03/07/16 1524  BP: (!) 108/58  Pulse: (!) 46  Resp: 12  Temp: 98.3 F (36.8 C)  TempSrc: Oral  SpO2: 95%  Weight: 163 lb (73.9 kg)  Height: 5\' 7"  (1.702 m)   EKG: Rate 74, axis stable, sinus, no st changes, RBBB, unable to view prior from 2011 but report states bradycardia and left axis and prominent R(V1), this represents a change from prior if report is accurate.     Assessment & Plan:

## 2016-03-07 NOTE — Telephone Encounter (Signed)
[place]

## 2016-03-07 NOTE — Patient Instructions (Signed)
We are checking the labs today and will call you back.  We think that the heart racing and palpitations are coming from the low blood counts.   Anemia, Nonspecific Anemia is a condition in which the concentration of red blood cells or hemoglobin in the blood is below normal. Hemoglobin is a substance in red blood cells that carries oxygen to the tissues of the body. Anemia results in not enough oxygen reaching these tissues.  CAUSES  Common causes of anemia include:   Excessive bleeding. Bleeding may be internal or external. This includes excessive bleeding from periods (in women) or from the intestine.   Poor nutrition.   Chronic kidney, thyroid, and liver disease.  Bone marrow disorders that decrease red blood cell production.  Cancer and treatments for cancer.  HIV, AIDS, and their treatments.  Spleen problems that increase red blood cell destruction.  Blood disorders.  Excess destruction of red blood cells due to infection, medicines, and autoimmune disorders. SIGNS AND SYMPTOMS   Minor weakness.   Dizziness.   Headache.  Palpitations.   Shortness of breath, especially with exercise.   Paleness.  Cold sensitivity.  Indigestion.  Nausea.  Difficulty sleeping.  Difficulty concentrating. Symptoms may occur suddenly or they may develop slowly.  DIAGNOSIS  Additional blood tests are often needed. These help your health care provider determine the best treatment. Your health care provider will check your stool for blood and look for other causes of blood loss.  TREATMENT  Treatment varies depending on the cause of the anemia. Treatment can include:   Supplements of iron, vitamin 123456, or folic acid.   Hormone medicines.   A blood transfusion. This may be needed if blood loss is severe.   Hospitalization. This may be needed if there is significant continual blood loss.   Dietary changes.  Spleen removal. HOME CARE INSTRUCTIONS Keep all  follow-up appointments. It often takes many weeks to correct anemia, and having your health care provider check on your condition and your response to treatment is very important. SEEK IMMEDIATE MEDICAL CARE IF:   You develop extreme weakness, shortness of breath, or chest pain.   You become dizzy or have trouble concentrating.  You develop heavy vaginal bleeding.   You develop a rash.   You have bloody or black, tarry stools.   You faint.   You vomit up blood.   You vomit repeatedly.   You have abdominal pain.  You have a fever or persistent symptoms for more than 2-3 days.   You have a fever and your symptoms suddenly get worse.   You are dehydrated.  MAKE SURE YOU:  Understand these instructions.  Will watch your condition.  Will get help right away if you are not doing well or get worse.   This information is not intended to replace advice given to you by your health care provider. Make sure you discuss any questions you have with your health care provider.   Document Released: 07/10/2004 Document Revised: 02/02/2013 Document Reviewed: 11/26/2012 Elsevier Interactive Patient Education [employer].

## 2016-03-07 NOTE — Assessment & Plan Note (Signed)
EKG with RBBB which is not concerning. Likely the palpitations are from her recently found anemia. Episode of dark stool could be related and checking troponin and CBC today. Referral to GI already placed for the anemia.

## 2016-03-10 ENCOUNTER — Telehealth: Payer: Self-pay | Admitting: Internal Medicine

## 2016-03-10 NOTE — Telephone Encounter (Signed)
Patient called to advise that she is still experiencing heart palpitations when she does anything. She is asking if it would be ok to take iron tablets along with the vit d prescribed.

## 2016-03-11 NOTE — Telephone Encounter (Signed)
Yes, that is okay.

## 2016-03-12 ENCOUNTER — Encounter: Payer: Self-pay | Admitting: Gastroenterology

## 2016-03-12 ENCOUNTER — Other Ambulatory Visit: Payer: Self-pay | Admitting: Internal Medicine

## 2016-03-12 MED ORDER — PANTOPRAZOLE SODIUM 40 MG PO TBEC: 40.0000 mg | DELAYED_RELEASE_TABLET | Freq: Every day | ORAL | 3 refills | Status: DC

## 2016-03-12 NOTE — Telephone Encounter (Signed)
Patient states she is taking a Iron 65.  Would like to know if this is ok or should she change it?

## 2016-03-12 NOTE — Telephone Encounter (Signed)
Tried to reach patient, left message.

## 2016-03-20 ENCOUNTER — Ambulatory Visit (INDEPENDENT_AMBULATORY_CARE_PROVIDER_SITE_OTHER): Payer: Medicare Other | Admitting: Physician Assistant

## 2016-03-20 ENCOUNTER — Encounter: Payer: Self-pay | Admitting: Physician Assistant

## 2016-03-20 VITALS — BP 108/52 | HR 72 | Ht 66.5 in | Wt 159.2 lb

## 2016-03-20 DIAGNOSIS — D649 Anemia, unspecified: Secondary | ICD-10-CM

## 2016-03-20 DIAGNOSIS — R634 Abnormal weight loss: Secondary | ICD-10-CM | POA: Diagnosis not present

## 2016-03-20 DIAGNOSIS — R1013 Epigastric pain: Secondary | ICD-10-CM | POA: Diagnosis not present

## 2016-03-20 MED ORDER — NA SULFATE-K SULFATE-MG SULF 17.5-3.13-1.6 GM/177ML PO SOLN
1.0000 | Freq: Once | ORAL | 0 refills | Status: AC
Start: 2016-03-20 — End: 2016-03-20

## 2016-03-20 NOTE — Progress Notes (Signed)
Chief Complaint: Anemia and weight loss  HPI:  Haley Mcmillan is an 82 year old female with a past medical history of anemia, arthritis, colon polyps, hyperlipidemia and unspecified essential hypertension, who was referred to me by Hoyt Koch, * for a complaint of anemia and weight loss.  She has followed with Dr. Ardis Hughs in the past for screening colonoscopies.  Independent review of report and images from patient's last colonoscopy completed on 01/27/2006 shows colon polyps. Pathology revealed inflamed hyperplastic polyps. It was not recommended for the patient have any further screening colonoscopies.  Per chart review patient recently saw her PCP Dr. Sharlet Salina on 03/07/2016 and at that time complained of heart palpitations which came on during exertion. She also describes an episode of dark stool. Most recent CBC completed on 03/07/2016 showed a hemoglobin of 8.3, this is a drop from 2 weeks previous where it was 9.6 and 5 years ago when it was normal at 14.1. She was instructed to start Protonix 40 mg daily.  Today, the patient tells me that since she was seen by Dr. Sharlet Salina 2 weeks ago all of her symptoms have improved and she is almost "back to normal". Recall that at that time she was started on a daily iron supplement as well as Pantoprazole 40 mg qd. She has been on these for about a week she tells me. Her appetite has come back her energy is back and the palpitations and chest pressure she was experiencing ar gone. The patient does tell me she has been having some constipation and larger bowel movements since starting iron. She is on daily Colace, but this does not seem to be helping.  Patient does tell me that she has experienced some weight loss, upon chart review this is approximately 20 pounds in the past year without trying. The patient believes this is somewhat related to getting a puppy in April of this year which made her so distracted that she forgot to eat.  Patient  denies fever, chills, blood in her stool, melena, continued fatigue, anorexia, nausea, vomiting, continued palpitations or abdominal pain.   Past Medical History:  Diagnosis Date  . Anemia   . Arthritis   . Colon polyps   . Olecranon bursitis    Elbow  . Other and unspecified hyperlipidemia   . Palpitations   . Unspecified essential hypertension     Past Surgical History:  Procedure Laterality Date  . COLONOSCOPY W/ POLYPECTOMY  01/2004  . CRYOTHERAPY     female-cryosurgery stated by pt  . POLYPECTOMY     from vocal surgery    Current Outpatient Prescriptions  Medication Sig Dispense Refill  . Cholecalciferol (VITAMIN D3) 5000 units CAPS Take 1 capsule by mouth daily.    Marland Kitchen docusate sodium (COLACE) 100 MG capsule Take 100 mg by mouth daily.    . ferrous sulfate 325 (65 FE) MG tablet Take 325 mg by mouth daily with breakfast.    . lisinopril-hydrochlorothiazide (PRINZIDE,ZESTORETIC) 10-12.5 MG tablet TAKE 1 TABLET BY MOUTH DAILY. 30 tablet 11  . pantoprazole (PROTONIX) 40 MG tablet Take 1 tablet (40 mg total) by mouth daily. 30 tablet 3  . VYTORIN 10-20 MG tablet TAKE 1 TABLET BY MOUTH AT BEDTIME. 30 tablet 11   No current facility-administered medications for this visit.     Allergies as of 03/20/2016  . (No Known Allergies)    Family History  Problem Relation Age of Onset  . Gallbladder disease Mother   . Aneurysm Father   .  Heart disease Father     CAD  . Breast cancer Sister   . Breast cancer Sister   . Cancer Neg Hx     Colon  . Diabetes Neg Hx   . Heart attack Neg Hx     Social History   Social History  . Marital status: Married    Spouse name: N/A  . Number of children: N/A  . Years of education: N/A   Occupational History  . retired Retired   Social History Main Topics  . Smoking status: Former Smoker    Packs/day: 0.50    Years: 50.00    Types: Cigarettes    Quit date: 10/31/1994  . Smokeless tobacco: Not on file     Comment: SMOKED SOME;  [place];   . Alcohol use 0.0 oz/week     Comment: wine  . Drug use: No  . Sexual activity: Not on file   Other Topics Concern  . 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             Review of Systems:     Constitutional: No weight loss, fever, chills, weakness or fatigue HEENT: Eyes: No change in vision               Ears, Nose, Throat:  No change in hearing or congestion Skin: No rash or itching Cardiovascular: Positive for palpitations No chest pain or pressure Respiratory: No SOB or cough Gastrointestinal: See HPI and otherwise negative Genitourinary: No dysuria or change in urinary frequency Neurological: No headache, dizziness or syncope Musculoskeletal: No new muscle or joint pain Hematologic: No bleeding or bruising Psychiatric: No history of depression or anxiety    Physical Exam:  Vital signs: Ht 5' 6.5" (1.689 m)   Wt 159 lb 4 oz (72.2 kg)   BMI 25.32 kg/m   General:   Pleasant Elderly African-American female appears to be in NAD, Well developed, Well nourished, alert and cooperative Head:  Normocephalic and atraumatic. Eyes:   PEERL, EOMI. No icterus. Conjunctiva pink. Ears:  Normal auditory acuity. Neck:  Supple Throat: Oral cavity and pharynx without inflammation, swelling or lesion.  Lungs: Respirations even and unlabored. Lungs clear to auscultation bilaterally.   No wheezes, crackles, or rhonchi.  Heart: Normal S1, S2. No MRG. Regular rate and rhythm. No peripheral edema, cyanosis or pallor.  Abdomen:  Soft, nondistended, nontender. No rebound or guarding. Normal bowel sounds. No appreciable masses or hepatomegaly. Rectal:  Not performed.  Msk:  Symmetrical without gross deformities.  Extremities:  Without edema, no deformity or joint  abnormality. Normal ROM. Neurologic:  Alert and  oriented x4;  grossly normal neurologically.  Skin:   Dry and intact without significant lesions or rashes. Psychiatric: Oriented to person, place and time. Demonstrates good judgement and reason without abnormal affect or behaviors.  RELEVANT LABS AND IMAGING: CBC    Component Value Date/Time   WBC 13.1 (H) 03/07/2016 1606   RBC 2.65 Repeated and verified X2. (L) 03/07/2016 1606   HGB 8.3 Repeated and verified X2. (L) 03/07/2016 1606   HCT 24.8 Repeated and verified X2. (L) 03/07/2016 1606   PLT 322.0 03/07/2016 1606   MCV 93.6 03/07/2016 1606   MCHC 33.6  03/07/2016 1606   RDW 13.9 03/07/2016 1606   LYMPHSABS 2.3 05/31/2010 1424   MONOABS 0.5 05/31/2010 1424   EOSABS 0.1 05/31/2010 1424   BASOSABS 0.0 05/31/2010 1424    CMP     Component Value Date/Time   NA 141 03/04/2016 1027   K 4.8 03/04/2016 1027   CL 105 03/04/2016 1027   CO2 33 (H) 03/04/2016 1027   GLUCOSE 107 (H) 03/04/2016 1027   BUN 36 (H) 03/04/2016 1027   CREATININE 1.16 03/04/2016 1027   CALCIUM 9.0 03/04/2016 1027   PROT 6.9 03/04/2016 1027   ALBUMIN 3.3 (L) 03/04/2016 1027   AST 18 03/04/2016 1027   ALT 8 03/04/2016 1027   ALKPHOS 74 03/04/2016 1027   BILITOT 0.3 03/04/2016 1027   GFRNONAA 66.50 05/31/2010 1424    Assessment: 1. Anemia:There appears to be a steady decline in hemoglobin over the past few years, most recently a  Point drop over the past 2 weeks, patient was started on iron supplementation a week ago as well as Pantoprazole 40 mg daily and feels much better already, no visible blood in stools; consider GI source of blood loss versus malabsorption of iron versus other 2. Weight loss: 20 pounds unintentionally over the past year; consider relation to increased activity level with new puppy versus decreased intake of food versus cancer or other 3. Epigastric Pain: Patient was experiencing palpitations and a chest pressure/epigastric pain 2 weeks  ago, but with addition of pantoprazole 40 mg over the past week these symptoms have been relieved  Plan: 1. At this time recommend the patient proceed with an EGD and colonoscopy to further evaluation of her weight loss and anemia. Discussed risks, benefits, limitations and alternatives and the patient agrees to proceed. These will be scheduled with Dr. Ardis Hughs at the [facility] 2. Patient should continue her iron supplementation and Pantoprazole 40 mg daily. 3. Recommend the patient switch from Colace to MiraLAX daily to assist with her iron induced constipation 4. Recommend the patient increase water intake to at least 6-8 8 ounce glasses per day 5. Patient to follow up per Dr. Ardis Hughs recommendations after time procedures.  Ellouise Newer, PA-C [facility] 03/20/2016, 1:44 PM  Cc: Hoyt Koch, *

## 2016-03-20 NOTE — Patient Instructions (Signed)

## 2016-03-21 NOTE — Progress Notes (Signed)
I agree with the above note, plan 

## 2016-04-07 ENCOUNTER — Telehealth: Payer: Self-pay | Admitting: Emergency Medicine

## 2016-04-07 NOTE — Telephone Encounter (Signed)
Pt called and stated she is having severe gas pains. She wants to know what she can do or take to relieve the gas. Please advise thanks.

## 2016-04-08 ENCOUNTER — Emergency Department (HOSPITAL_COMMUNITY): Payer: Medicare Other

## 2016-04-08 ENCOUNTER — Telehealth: Payer: Self-pay | Admitting: Internal Medicine

## 2016-04-08 ENCOUNTER — Inpatient Hospital Stay (HOSPITAL_COMMUNITY)
Admission: EM | Admit: 2016-04-08 | Discharge: 2016-04-11 | DRG: 543 | Disposition: A | Discharge status: Home / Self Care | Payer: Medicare Other | Attending: Internal Medicine | Admitting: Internal Medicine

## 2016-04-08 ENCOUNTER — Encounter (HOSPITAL_COMMUNITY): Payer: Self-pay | Admitting: Emergency Medicine

## 2016-04-08 DIAGNOSIS — K921 Melena: Secondary | ICD-10-CM | POA: Diagnosis not present

## 2016-04-08 DIAGNOSIS — R634 Abnormal weight loss: Secondary | ICD-10-CM | POA: Diagnosis present

## 2016-04-08 DIAGNOSIS — R109 Unspecified abdominal pain: Secondary | ICD-10-CM | POA: Diagnosis present

## 2016-04-08 DIAGNOSIS — Z87891 Personal history of nicotine dependence: Secondary | ICD-10-CM

## 2016-04-08 DIAGNOSIS — Z803 Family history of malignant neoplasm of breast: Secondary | ICD-10-CM | POA: Diagnosis not present

## 2016-04-08 DIAGNOSIS — C49A2 Gastrointestinal stromal tumor of stomach: Secondary | ICD-10-CM | POA: Diagnosis present

## 2016-04-08 DIAGNOSIS — E784 Other hyperlipidemia: Secondary | ICD-10-CM

## 2016-04-08 DIAGNOSIS — M6281 Muscle weakness (generalized): Secondary | ICD-10-CM

## 2016-04-08 DIAGNOSIS — C799 Secondary malignant neoplasm of unspecified site: Secondary | ICD-10-CM

## 2016-04-08 DIAGNOSIS — D509 Iron deficiency anemia, unspecified: Secondary | ICD-10-CM

## 2016-04-08 DIAGNOSIS — I10 Essential (primary) hypertension: Secondary | ICD-10-CM | POA: Diagnosis present

## 2016-04-08 DIAGNOSIS — C787 Secondary malignant neoplasm of liver and intrahepatic bile duct: Secondary | ICD-10-CM | POA: Diagnosis present

## 2016-04-08 DIAGNOSIS — Z8249 Family history of ischemic heart disease and other diseases of the circulatory system: Secondary | ICD-10-CM | POA: Diagnosis not present

## 2016-04-08 DIAGNOSIS — E785 Hyperlipidemia, unspecified: Secondary | ICD-10-CM | POA: Diagnosis present

## 2016-04-08 DIAGNOSIS — C786 Secondary malignant neoplasm of retroperitoneum and peritoneum: Secondary | ICD-10-CM | POA: Diagnosis present

## 2016-04-08 DIAGNOSIS — N179 Acute kidney failure, unspecified: Secondary | ICD-10-CM | POA: Diagnosis present

## 2016-04-08 DIAGNOSIS — D4989 Neoplasm of unspecified behavior of other specified sites: Secondary | ICD-10-CM | POA: Diagnosis present

## 2016-04-08 DIAGNOSIS — Z79899 Other long term (current) drug therapy: Secondary | ICD-10-CM

## 2016-04-08 DIAGNOSIS — Z86010 Personal history of colonic polyps: Secondary | ICD-10-CM | POA: Diagnosis not present

## 2016-04-08 DIAGNOSIS — C169 Malignant neoplasm of stomach, unspecified: Secondary | ICD-10-CM | POA: Diagnosis not present

## 2016-04-08 DIAGNOSIS — Z801 Family history of malignant neoplasm of trachea, bronchus and lung: Secondary | ICD-10-CM | POA: Diagnosis not present

## 2016-04-08 DIAGNOSIS — K7689 Other specified diseases of liver: Secondary | ICD-10-CM | POA: Diagnosis present

## 2016-04-08 DIAGNOSIS — E876 Hypokalemia: Secondary | ICD-10-CM | POA: Diagnosis present

## 2016-04-08 DIAGNOSIS — K319 Disease of stomach and duodenum, unspecified: Secondary | ICD-10-CM | POA: Diagnosis not present

## 2016-04-08 DIAGNOSIS — K3189 Other diseases of stomach and duodenum: Secondary | ICD-10-CM | POA: Diagnosis present

## 2016-04-08 DIAGNOSIS — C49A0 Gastrointestinal stromal tumor, unspecified site: Secondary | ICD-10-CM | POA: Diagnosis not present

## 2016-04-08 LAB — CBC
HEMATOCRIT: 32.5 % — AB (ref 36.0–46.0)
Hemoglobin: 10 g/dL — ABNORMAL LOW (ref 12.0–15.0)
MCH: 29.3 pg (ref 26.0–34.0)
MCHC: 30.8 g/dL (ref 30.0–36.0)
MCV: 95.3 fL (ref 78.0–100.0)
Platelets: 385 10*3/uL (ref 150–400)
RBC: 3.41 MIL/uL — ABNORMAL LOW (ref 3.87–5.11)
RDW: 14.8 % (ref 11.5–15.5)
WBC: 12.1 10*3/uL — ABNORMAL HIGH (ref 4.0–10.5)

## 2016-04-08 LAB — COMPREHENSIVE METABOLIC PANEL
ALBUMIN: 3.1 g/dL — AB (ref 3.5–5.0)
ALT: 11 U/L — ABNORMAL LOW (ref 14–54)
AST: 25 U/L (ref 15–41)
Alkaline Phosphatase: 75 U/L (ref 38–126)
Anion gap: 10 (ref 5–15)
BUN: 14 mg/dL (ref 6–20)
CHLORIDE: 103 mmol/L (ref 101–111)
CO2: 28 mmol/L (ref 22–32)
Calcium: 9.6 mg/dL (ref 8.9–10.3)
Creatinine, Ser: 1.39 mg/dL — ABNORMAL HIGH (ref 0.44–1.00)
GFR calc Af Amer: 40 mL/min — ABNORMAL LOW (ref >60)
GFR calc non Af Amer: 35 mL/min — ABNORMAL LOW (ref >60)
GLUCOSE: 105 mg/dL — AB (ref 65–99)
POTASSIUM: 4.1 mmol/L (ref 3.5–5.1)
Sodium: 141 mmol/L (ref 135–145)
Total Bilirubin: 0.7 mg/dL (ref 0.3–1.2)
Total Protein: 7 g/dL (ref 6.5–8.1)

## 2016-04-08 LAB — URINALYSIS, ROUTINE W REFLEX MICROSCOPIC
BILIRUBIN URINE: NEGATIVE
GLUCOSE, UA: NEGATIVE mg/dL
KETONES UR: NEGATIVE mg/dL
Nitrite: NEGATIVE
PH: 7.5 (ref 5.0–8.0)
Protein, ur: NEGATIVE mg/dL
Specific Gravity, Urine: 1.01 (ref 1.005–1.030)

## 2016-04-08 LAB — I-STAT TROPONIN, ED: Troponin i, poc: 0 ng/mL (ref 0.00–0.08)

## 2016-04-08 LAB — URINE MICROSCOPIC-ADD ON

## 2016-04-08 LAB — LIPASE, BLOOD: LIPASE: 28 U/L (ref 11–51)

## 2016-04-08 LAB — I-STAT BETA HCG BLOOD, ED (MC, WL, AP ONLY)

## 2016-04-08 MED ORDER — PANTOPRAZOLE SODIUM 40 MG PO TBEC
40.0000 mg | DELAYED_RELEASE_TABLET | Freq: Every day | ORAL | Status: DC
  Administered 2016-04-08 – 2016-04-11 (×3): 40 mg via ORAL
  Filled 2016-04-08 (×4): qty 1

## 2016-04-08 MED ORDER — SENNOSIDES-DOCUSATE SODIUM 8.6-50 MG PO TABS
1.0000 | ORAL_TABLET | Freq: Every evening | ORAL | Status: DC | PRN
  Filled 2016-04-08: qty 1

## 2016-04-08 MED ORDER — SODIUM CHLORIDE 0.9 % IV SOLN: INTRAVENOUS | Status: DC

## 2016-04-08 MED ORDER — GI COCKTAIL ~~LOC~~
30.0000 mL | Freq: Once | ORAL | Status: AC
  Administered 2016-04-08: 30 mL via ORAL
  Filled 2016-04-08: qty 30

## 2016-04-08 MED ORDER — ONDANSETRON HCL 4 MG PO TABS: 4.0000 mg | ORAL_TABLET | Freq: Four times a day (QID) | ORAL | Status: DC | PRN

## 2016-04-08 MED ORDER — DOCUSATE SODIUM 100 MG PO CAPS
100.0000 mg | ORAL_CAPSULE | Freq: Every day | ORAL | Status: DC
  Administered 2016-04-08 – 2016-04-11 (×3): 100 mg via ORAL
  Filled 2016-04-08 (×4): qty 1

## 2016-04-08 MED ORDER — MORPHINE SULFATE (PF) 2 MG/ML IV SOLN: 1.0000 mg | INTRAVENOUS | Status: DC | PRN

## 2016-04-08 MED ORDER — ONDANSETRON HCL 4 MG/2ML IJ SOLN: 4.0000 mg | Freq: Four times a day (QID) | INTRAMUSCULAR | Status: DC | PRN

## 2016-04-08 MED ORDER — SODIUM CHLORIDE 0.9 % IV SOLN
INTRAVENOUS | Status: AC
  Administered 2016-04-08: 15:00:00 via INTRAVENOUS

## 2016-04-08 MED ORDER — ACETAMINOPHEN 650 MG RE SUPP: 650.0000 mg | Freq: Four times a day (QID) | RECTAL | Status: DC | PRN

## 2016-04-08 MED ORDER — IOPAMIDOL (ISOVUE-300) INJECTION 61%
INTRAVENOUS | Status: AC
  Administered 2016-04-08: 75 mL
  Filled 2016-04-08: qty 75

## 2016-04-08 MED ORDER — FERROUS SULFATE 325 (65 FE) MG PO TABS: 325.0000 mg | ORAL_TABLET | Freq: Every day | ORAL | Status: DC

## 2016-04-08 MED ORDER — ACETAMINOPHEN 325 MG PO TABS
650.0000 mg | ORAL_TABLET | Freq: Four times a day (QID) | ORAL | Status: DC | PRN
Start: 2016-04-08 — End: 2016-04-12
  Filled 2016-04-08: qty 2

## 2016-04-08 MED ORDER — VITAMIN D 1000 UNITS PO TABS
5000.0000 [IU] | ORAL_TABLET | Freq: Every day | ORAL | Status: DC
  Administered 2016-04-10 – 2016-04-11 (×2): 5000 [IU] via ORAL
  Filled 2016-04-08 (×3): qty 5

## 2016-04-08 MED ORDER — EZETIMIBE-SIMVASTATIN 10-20 MG PO TABS
1.0000 | ORAL_TABLET | Freq: Every day | ORAL | Status: DC
  Administered 2016-04-09 – 2016-04-10 (×2): 1 via ORAL
  Filled 2016-04-08 (×5): qty 1

## 2016-04-08 NOTE — ED Notes (Signed)
Attempted report 

## 2016-04-08 NOTE — Telephone Encounter (Signed)
Haley Mcmillan called to advise that they have scanned the patient and found new cancer. Please check ED note from today

## 2016-04-08 NOTE — H&P (Signed)
History and Physical    Haley Mcmillan [id number] DOB: December 07, 1933 DOA: 04/08/2016  PCP: Hoyt Koch, MD Patient coming from: Home  Chief Complaint: Abdominal pain  HPI: Haley Mcmillan is a 82 y.o. female with medical history significant for anemia, arthritis, colonic polyps, hyperlipidemia, hypertension presents to the emergency department after recent GI visit with persistent worsening epigastric pain and unintentional weight loss. Initial evaluation reveals a gastric mass and liver nodules concerning for malignancy and metastases.  Information is obtained from the patient and the chart. She reports intermittent epigastric pain for the last month. Associated symptoms include nausea without emesis and decreased oral intake. She reports a 20 pound weight loss as a result. She takes pantoprazole with intermittent relief. Last 2 days pain worse and she called her PCP who recommended she come to the emergency department. She denies diarrhea but does endorse a propensity for constipation and takes Miralax daily. He denies dysuria hematuria frequency or urgency but does endorse occasional difficulty initiating flow. She denies headache dizziness syncope or near-syncope. She denies lower extremity edema or orthopnea   ED Course: Emergency department she is afebrile hemodynamically stable and not hypoxic.  Review of Systems: As per HPI otherwise 10 point review of systems negative.   Ambulatory Status: Ambulates independently to stay gait no recent falls  Past Medical History:  Diagnosis Date  . Anemia   . Arthritis   . Colon polyps   . Olecranon bursitis    Elbow  . Other and unspecified hyperlipidemia   . Palpitations   . Unspecified essential hypertension     Past Surgical History:  Procedure Laterality Date  . COLONOSCOPY W/ POLYPECTOMY  01/2004  . CRYOTHERAPY     female-cryosurgery stated by pt  . POLYPECTOMY     from vocal surgery    Social History   Social  History  . Marital status: Married    Spouse name: N/A  . Number of children: 1  . Years of education: N/A   Occupational History  . retired Retired   Social History Main Topics  . Smoking status: Former Smoker    Packs/day: 0.50    Years: 50.00    Types: Cigarettes    Quit date: 10/31/1994  . Smokeless tobacco: Never Used     Comment: SMOKED SOME; QUIT 1996;   . Alcohol use 0.0 oz/week     Comment: wine  . Drug use: No  . Sexual activity: Not on file   Other Topics Concern  . 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             No Known Allergies  Family History  Problem Relation Age of Onset  . Gallstones Mother   . Aneurysm Father     brain  . Heart disease Father     CAD  . Breast cancer Sister   . Breast cancer Sister   . Heart attack Brother   . Cancer Brother   . Lung cancer Son   . Diabetes Neg Hx     Prior to Admission medications   Medication Sig Start Date End Date Taking? Authorizing Provider  Cholecalciferol (VITAMIN D3) 5000 units CAPS Take 5,000 Units by mouth daily.  Yes Historical Provider, MD  ferrous sulfate 325 (65 FE) MG tablet Take 325 mg by mouth daily with breakfast.   Yes Historical Provider, MD  lisinopril-hydrochlorothiazide (PRINZIDE,ZESTORETIC) 10-12.5 MG tablet TAKE 1 TABLET BY MOUTH DAILY. 04/30/15  Yes Hoyt Koch, MD  pantoprazole (PROTONIX) 40 MG tablet Take 1 tablet (40 mg total) by mouth daily. 03/12/16  Yes Hoyt Koch, MD  polyethylene glycol [facility] / GLYCOLAX) packet Take 17 g by mouth daily.   Yes Historical Provider, MD  VYTORIN 10-20 MG tablet TAKE 1 TABLET BY MOUTH AT BEDTIME. 03/26/15  Yes Hoyt Koch, MD    Physical Exam: Vitals:   04/08/16 1127 04/08/16 1253 04/08/16 1345  04/08/16 1458  BP: 117/59 129/62 130/62 (!) 110/54  Pulse: 72 61 68 76  Resp: 18 14 16    Temp:    99.1 F (37.3 C)  TempSrc:    Oral  SpO2: 100% 100% 99% 99%  Weight:         General:  Appears Slightly anxious and thin Eyes:  PERRL, EOMI, normal lids, iris ENT:  grossly normal hearing, lips & tongue, his membranes of her mouth are pink slightly dry Neck:  no LAD, masses or thyromegaly Cardiovascular:  RRR, no m/r/g. No LE edema.  Respiratory:  CTA bilaterally, no w/r/r. Normal respiratory effort. Abdomen:  soft, tenderness epigastric area nondistended, positive bowel sounds no guarding or rebounding Skin:  no rash or induration seen on limited exam Musculoskeletal:  grossly normal tone BUE/BLE, good ROM, no bony abnormality Psychiatric:  grossly normal mood and affect, speech fluent and appropriate, AOx3 Neurologic:  CN 2-12 grossly intact, moves all extremities in coordinated fashion, sensation intact  Labs on Admission: I have personally reviewed following labs and imaging studies  CBC:  Recent Labs Lab 04/08/16 1101  WBC 12.1*  HGB 10.0*  HCT 32.5*  MCV 95.3  PLT 0000000   Basic Metabolic Panel:  Recent Labs Lab 04/08/16 1101  NA 141  K 4.1  CL 103  CO2 28  GLUCOSE 105*  BUN 14  CREATININE 1.39*  CALCIUM 9.6   GFR: Estimated Creatinine Clearance: 30.8 mL/min (by C-G formula based on SCr of 1.39 mg/dL (H)). Liver Function Tests:  Recent Labs Lab 04/08/16 1101  AST 25  ALT 11*  ALKPHOS 75  BILITOT 0.7  PROT 7.0  ALBUMIN 3.1*    Recent Labs Lab 04/08/16 1101  LIPASE 28   No results for input(s): AMMONIA in the last 168 hours. Coagulation Profile: No results for input(s): INR, PROTIME in the last 168 hours. Cardiac Enzymes: No results for input(s): CKTOTAL, CKMB, CKMBINDEX, TROPONINI in the last 168 hours. BNP (last 3 results) No results for input(s): PROBNP in the last 8760 hours. HbA1C: No results for input(s): HGBA1C in the last 72  hours. CBG: No results for input(s): GLUCAP in the last 168 hours. Lipid Profile: No results for input(s): CHOL, HDL, LDLCALC, TRIG, CHOLHDL, LDLDIRECT in the last 72 hours. Thyroid Function Tests: No results for input(s): TSH, T4TOTAL, FREET4, T3FREE, THYROIDAB in the last 72 hours. Anemia Panel: No results for input(s): VITAMINB12, FOLATE, FERRITIN, TIBC, IRON, RETICCTPCT in the last 72 hours. Urine analysis: No results found for: COLORURINE, APPEARANCEUR, LABSPEC, PHURINE, GLUCOSEU, HGBUR, BILIRUBINUR, KETONESUR, PROTEINUR, UROBILINOGEN, NITRITE, LEUKOCYTESUR  Creatinine Clearance: Estimated Creatinine Clearance: 30.8 mL/min (by C-G formula based on SCr of 1.39 mg/dL (H)).  Sepsis Labs: @LABRCNTIP (procalcitonin:4,lacticidven:4) )No results found for this or any previous visit (from the past 240 hour(s)).  Radiological Exams on Admission: Ct Abdomen Pelvis W Contrast  Result Date: 04/08/2016 CLINICAL DATA:  Epigastric pain, anemia, colon polyps, weight loss EXAM: CT ABDOMEN AND PELVIS WITH CONTRAST TECHNIQUE: Multidetector CT imaging of the abdomen and pelvis was performed using the standard protocol following bolus administration of intravenous contrast. CONTRAST:  61mL ISOVUE-300 IOPAMIDOL (ISOVUE-300) INJECTION 61% COMPARISON:  None. FINDINGS: Lower chest: Lung bases are unremarkable Hepatobiliary: Multiple nodular densities are noted throughout the liver the largest in left hepatic lobe measures 2 cm findings are consistent with metastatic disease. Pancreas: Enhanced pancreas is unremarkable. Spleen: Enhanced spleen is unremarkable. There is a nodular lesion just medial to the spleen measures 3.9 cm highly suspicious for metastatic disease. Adrenals/Urinary Tract: No adrenal gland mass. Kidneys are symmetrical in size and enhancement. No hydronephrosis or hydroureter. Delayed renal images shows bilateral renal symmetrical excretion. Stomach/Bowel: There is large exophytic infiltrating  mass in proximal lateral wall of the stomach. The mass measures at least 10 by 7.4 cm. There is intraluminal debris within mass and the lumen communicates with main gastric lumen. Findings highly suspicious for malignant gastrointestinal stromal tumor. No small bowel obstruction. There is no evidence of free abdominal air. Vascular/Lymphatic: No aortic aneurysm. Atherosclerotic plaques and calcifications are noted abdominal aorta and iliac arteries. There is anterior omental lesion measures at least 6.2 x 4.3 cm. Second omental lesion axial image 46 measures 4.3 by 3.8 cm. Additional smaller omental nodules are noted the largest measures 5 cm. Findings are highly suspicious for omental caking/ metastatic disease. There is a lesion in right cul-de-sac measures 2.5 cm highly suspicious for drop metastasis. Reproductive: The uterus is atrophic.  No adnexal masses noted. Other: The urinary bladder is unremarkable. There is no abdominal ascites. Musculoskeletal: No destructive bony lesions are noted. Disc space flattening with vacuum disc phenomenon noted at L5-S1 level. IMPRESSION: 1. There is large exophytic mass in proximal lateral stomach measures at least 10 x 7.4 cm highly suspicious for malignant gastric tumor. 2. There are nodular lesions within omentum and just medial to the spleen highly suspicious for metastatic disease. 3. Multiple liver nodules are noted consistent with metastatic disease. 4. No hydronephrosis or hydroureter. 5. No small bowel obstruction. These results were called by telephone at the time of interpretation on 04/08/2016 at 12:54 pm to Dr. Zenovia Jarred , who verbally acknowledged these results. Electronically Signed   By: Lahoma Crocker M.D.   On: 04/08/2016 12:56    EKG: Independently reviewed.Sinus rhythm Right bundle branch block  Assessment/Plan Principal Problem:   Neoplasm of abdomen Active Problems:   Hyperlipidemia   Essential hypertension   Loss of weight   Liver  nodule   AKI (acute kidney injury) ([place])   Abdominal pain   Abdominal mass   #1. Abdominal mass/neoplasm of the abdomen. CT reveals large exophytic mass in proximal lateral stomach measures at least 10 x 7.4 cm highly suspicious for malignantgastric tumor. 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 GI note indicates this will be conscious sedation not MAC -Holding tomorrow's iron per GI  #2. Hypertension. Controlled in the emergency department. Home medications include lisinopril, hydrochlorothiazide -We'll hold home meds for now -Gentle IV fluids  #3. Acute kidney injury. Creatinine 1.3. Likely related to above as well as medications. -Hold nephrotoxins -Gentle IV  fluids -Monitor urine output -Recheck in the a.m.  #4. Abdominal pain/nausea. Likely related to above -Analgesia -Zofran as needed -Soft diet until 5 AM tomorrow at which time she can have clear liquids -Nothing by mouth at 10 AM    DVT prophylaxis:  scd Code Status: full  Family Communication: husband at bedside  Disposition Plan: home when ready  Consults called: [facility] gi, oncology per ED MD  Admission status: inpatient    Radene Gunning MD [facility]  If 7PM-7AM, please contact night-coverage [email or web address] Password [email or web address]  04/08/2016, 3:35 PM

## 2016-04-08 NOTE — Telephone Encounter (Signed)
[facility]  Patient Name: Haley Mcmillan  DOB: 01/23/1934    Initial Comment Caller states she's having abdominal pain.    Nurse Assessment  Nurse: Wayne Sever, RN, Tillie Rung Date/Time (Eastern Time): 04/08/2016 9:30:54 AM  Confirm and document reason for call. If symptomatic, describe symptoms. You must click the next button to save text entered. ---Caller states she is having upper abdominal pain. She states she belched and it relieved the pain just a bit. She states she had a good BM this morning. The pain is in the middle per caller.  Has the patient traveled out of the country within the last 30 days? ---Not Applicable  Does the patient have any new or worsening symptoms? ---Yes  Will a triage be completed? ---Yes  Related visit to physician within the last 2 weeks? ---No  Does the PT have any chronic conditions? (i.e. diabetes, asthma, etc.) ---No  Is this a behavioral health or substance abuse call? ---No     Guidelines    Guideline Title Affirmed Question Affirmed Notes  Abdominal Pain - Upper [1] Pain lasts > 10 minutes AND [2] age > 54    Final Disposition User   Go to ED Now Wayne Sever, RN, [facility] - ED   Disagree/Comply: Comply

## 2016-04-08 NOTE — Telephone Encounter (Signed)
Please advise in Dr. Crawford's absence, thanks.  

## 2016-04-08 NOTE — Telephone Encounter (Signed)
Left message on voice mail informing patient that she needs to call and schedule an office visit to be seen for the below issue.

## 2016-04-08 NOTE — Telephone Encounter (Signed)
She needs to be seen.

## 2016-04-08 NOTE — ED Notes (Signed)
Pt to CT

## 2016-04-08 NOTE — Consult Note (Addendum)
[facility] Consult: 1:52 PM 04/08/2016  LOS: 0 days    Referring Provider: Dr Thomasene Lot in ED  Primary Care Physician:  Hoyt Koch, MD Primary Gastroenterologist: [facility].   Dr Ardis Hughs    Reason for Consultation:  Gastric mass.    HPI: Haley Mcmillan is a 82 y.o. female.  PMH anemia.  HLD.  HTN.   2007 Colonoscopy: inflamed hyperplastic polyps.  No further screening colonoscopies recommended.   Seen 10/5 by GI APP for anemia, wt loss.  Hgb 9.6 on 9/19, 8.3 on 9/22 was 8.3.  Pt had reported dark stool to her PMD who initiated po iron and daily PPI.  Pt reported felling better and having constipation to GI APP.  20 # of unintentional wt loss in 1 year but pt attributed this to "getting a puppy in April of this year which made her so distracted that she forgot to eat".  Pt did not endorse abd pain, n/v.  Pt was set up for EGD 11/21.  Miralax and colace added.    Presenting to ED today with progressive epigastric pain for 7 to 10 days.  No n/v.  Regular BMs with daily Miralax.  .   CT scan unfortunately shows 10 x 7.4 cm exophytic gastric mass suspicious for gastric tumor.  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  Her Hgb has risen to 10.      Past Medical History:  Diagnosis Date  . Anemia   . Arthritis   . Colon polyps   . Olecranon bursitis    Elbow  . Other and unspecified hyperlipidemia   . Palpitations   . Unspecified essential hypertension     Past Surgical History:  Procedure Laterality Date  . COLONOSCOPY W/ POLYPECTOMY  01/2004  . CRYOTHERAPY     female-cryosurgery stated by pt  . POLYPECTOMY     from vocal surgery    Prior to Admission medications   Medication Sig Start Date End Date Taking? Authorizing Provider  Cholecalciferol (VITAMIN D3) 5000 units CAPS Take 1 capsule by  mouth daily.    Historical Provider, MD  docusate sodium (COLACE) 100 MG capsule Take 100 mg by mouth daily.    Historical Provider, MD  ferrous sulfate 325 (65 FE) MG tablet Take 325 mg by mouth daily with breakfast.    Historical Provider, MD  lisinopril-hydrochlorothiazide (PRINZIDE,ZESTORETIC) 10-12.5 MG tablet TAKE 1 TABLET BY MOUTH DAILY. 04/30/15   Hoyt Koch, MD  pantoprazole (PROTONIX) 40 MG tablet Take 1 tablet (40 mg total) by mouth daily. 03/12/16   Hoyt Koch, MD  VYTORIN 10-20 MG tablet TAKE 1 TABLET BY MOUTH AT BEDTIME. 03/26/15   Hoyt Koch, MD    Scheduled Meds:  Infusions:  PRN Meds:    Allergies as of 04/08/2016  . (No Known Allergies)    Family History  Problem Relation Age of Onset  . Gallstones Mother   . Aneurysm Father     brain  . Heart disease Father     CAD  . Breast cancer Sister   .  Breast cancer Sister   . Heart attack Brother   . Cancer Brother   . Lung cancer Son   . Diabetes Neg Hx     Social History   Social History  . Marital status: Married    Spouse name: N/A  . Number of children: 1  . Years of education: N/A   Occupational History  . retired Retired   Social History Main Topics  . Smoking status: Former Smoker    Packs/day: 0.50    Years: 50.00    Types: Cigarettes    Quit date: 10/31/1994  . Smokeless tobacco: Never Used     Comment: SMOKED SOME; QUIT 1996;   . Alcohol use 0.0 oz/week     Comment: wine  . Drug use: No  . Sexual activity: Not on file   Other Topics Concern  . 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             REVIEW OF SYSTEMS: Constitutional:  Some weakness and fatigue, not profound ENT:  No nose bleeds Pulm:  No cough or dyspnea CV:  No CP, no LE edema.  Before starting po iron had incidence of palpitations with exertion,.  GU:  No hematuria, no frequency GI:  No dysphagia.  See HPI Heme:  No unusual bleeding or bruising   Transfusions:  none Neuro:  No headaches, no peripheral tingling or numbness Derm:  No itching, no rash or sores.  Endocrine:  No sweats or chills.  No polyuria or dysuria Immunization:  reviewed Travel:  None beyond local counties in last few months.    PHYSICAL EXAM: Vital signs in last 24 hours: Vitals:   04/08/16 1127 04/08/16 1253  BP: 117/59 129/62  Pulse: 72 61  Resp: 18 14  Temp:     Wt Readings from Last 3 Encounters:  04/08/16 69.8 kg (153 lb 12.8 oz)  03/20/16 72.2 kg (159 lb 4 oz)  03/07/16 73.9 kg (163 lb)    General: pleasant, somewhat ill looking AAF.  Comfortable but a bit anxious Head:  No asymmetry or swellling  Eyes:  No icterus or pallor Ears:  Not HOH  Nose:  No congestion or discharge Mouth:  Clear and moist oropharynx.  tonguw midline Neck:  No mass, no TMG.  No JVD. Lungs:  Clear bil.  No cough or dyspnea Heart: RRR.  No mrg Abdomen:  Soft, NT, ND.  No mass or HSM.  No hernia.  No bruits.   Rectal: deferred.     Musc/Skeltl: no joint swelling, deformity, erythema Extremities:  No CCE  Neurologic:  Oriented x 3, fully alert.  Moves all 4 limbs Skin:  No rash or sores. Tattoos:  none   Psych:  Appears worried/anxious.  Cooperative.   Appropriate.    Intake/Output from previous day: No intake/output data recorded. Intake/Output this shift: No intake/output data recorded.  LAB RESULTS:  Recent Labs  04/08/16 1101  WBC 12.1*  HGB 10.0*  HCT 32.5*  PLT 385   BMET Lab Results  Component Value Date   NA 141 04/08/2016   NA 141 03/04/2016   NA 143 04/24/2015   K 4.1 04/08/2016   K 4.8 03/04/2016   K 4.6  04/24/2015   CL 103 04/08/2016   CL 105 03/04/2016   CL 105 04/24/2015   CO2 28 04/08/2016   CO2 33 (H) 03/04/2016   CO2 30 04/24/2015   GLUCOSE 105 (H)  04/08/2016   GLUCOSE 107 (H) 03/04/2016   GLUCOSE 115 (H) 04/24/2015   BUN 14 04/08/2016   BUN 36 (H) 03/04/2016   BUN 15 04/24/2015   CREATININE 1.39 (H) 04/08/2016   CREATININE 1.16 03/04/2016   CREATININE 1.18 04/24/2015   CALCIUM 9.6 04/08/2016   CALCIUM 9.0 03/04/2016   CALCIUM 10.0 04/24/2015   LFT  Recent Labs  04/08/16 1101  PROT 7.0  ALBUMIN 3.1*  AST 25  ALT 11*  ALKPHOS 75  BILITOT 0.7   PT/INR No results found for: INR, PROTIME Hepatitis Panel No results for input(s): HEPBSAG, HCVAB, HEPAIGM, HEPBIGM in the last 72 hours. C-Diff No components found for: CDIFF Lipase     Component Value Date/Time   LIPASE 28 04/08/2016 1101    Drugs of Abuse  No results found for: LABOPIA, COCAINSCRNUR, LABBENZ, AMPHETMU, THCU, LABBARB   RADIOLOGY STUDIES: Ct Abdomen Pelvis W Contrast  Result Date: 04/08/2016 CLINICAL DATA:  Epigastric pain, anemia, colon polyps, weight loss EXAM: CT ABDOMEN AND PELVIS WITH CONTRAST TECHNIQUE: Multidetector CT imaging of the abdomen and pelvis was performed using the standard protocol following bolus administration of intravenous contrast. CONTRAST:  48mL ISOVUE-300 IOPAMIDOL (ISOVUE-300) INJECTION 61% COMPARISON:  None. FINDINGS: Lower chest: Lung bases are unremarkable Hepatobiliary: Multiple nodular densities are noted throughout the liver the largest in left hepatic lobe measures 2 cm findings are consistent with metastatic disease. Pancreas: Enhanced pancreas is unremarkable. Spleen: Enhanced spleen is unremarkable. There is a nodular lesion just medial to the spleen measures 3.9 cm highly suspicious for metastatic disease. Adrenals/Urinary Tract: No adrenal gland mass. Kidneys are symmetrical in size and enhancement. No hydronephrosis or hydroureter. Delayed renal images shows bilateral renal symmetrical excretion. Stomach/Bowel: There is large exophytic infiltrating mass in proximal lateral wall of the stomach. The mass measures at least  10 by 7.4 cm. There is intraluminal debris within mass and the lumen communicates with main gastric lumen. Findings highly suspicious for malignant gastrointestinal stromal tumor. No small bowel obstruction. There is no evidence of free abdominal air. Vascular/Lymphatic: No aortic aneurysm. Atherosclerotic plaques and calcifications are noted abdominal aorta and iliac arteries. There is anterior omental lesion measures at least 6.2 x 4.3 cm. Second omental lesion axial image 46 measures 4.3 by 3.8 cm. Additional smaller omental nodules are noted the largest measures 5 cm. Findings are highly suspicious for omental caking/ metastatic disease. There is a lesion in right cul-de-sac measures 2.5 cm highly suspicious for drop metastasis. Reproductive: The uterus is atrophic.  No adnexal masses noted. Other: The urinary bladder is unremarkable. There is no abdominal ascites. Musculoskeletal: No destructive bony lesions are noted. Disc space flattening with vacuum disc phenomenon noted at L5-S1 level. IMPRESSION: 1. There is large exophytic mass in proximal lateral stomach measures at least 10 x 7.4 cm highly suspicious for malignant gastric tumor. 2. There are nodular lesions within omentum and just medial to the spleen highly suspicious for metastatic disease. 3. Multiple liver nodules are noted consistent with metastatic disease. 4. No hydronephrosis or hydroureter. 5. No small bowel obstruction. These results were called by telephone at the time of interpretation on 04/08/2016 at 12:54 pm to Dr. Zenovia Jarred , who verbally acknowledged these results. Electronically Signed   By: Orlean Bradford.D.  On: 04/08/2016 12:56     IMPRESSION:   *  Symptomatic gastric mass with pain, weight loss, anemia.      PLAN:     *  EGD.  Tentatively set for ~ 3 pm tomorrow, this will be conscious sedation not MAC.  Need to confirm this with Dr Loletha Carrow.  Ok to eat now.  Since iron residue can create false impression of  blood, will hold tomorrow's dose and resume on 10/26.     Azucena Freed  04/08/2016, 1:52 PM Pager: [phone]  I have reviewed the entire case in detail with the above APP and discussed the plan in detail.  Therefore, I agree with the diagnoses recorded above. In addition,  I have personally interviewed and examined the patient and have personally reviewed any abdominal/pelvic CT scan images.  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 No tenderness or palpable mass or hepatomegaly on exam.  EGD tomorrow, she is agreeable.   [name] Pager [phone]  Mon-Fri 8a-5p [phone] after 5p, weekends, holidays

## 2016-04-08 NOTE — ED Triage Notes (Signed)
Pt sts mid abd pain with poor appetite x 1 week

## 2016-04-08 NOTE — ED Provider Notes (Signed)
[facility] DEPT Provider Note   CSN: [id number] Arrival date & time: 04/08/16  1014     History   Chief Complaint Chief Complaint  Patient presents with  . Abdominal Pain    HPI Haley Mcmillan is a 82 y.o. female.  HPI    Haley Mcmillan is an 82 year old female with a past medical history of anemia, arthritis, colon polyps, hyperlipidemia and unspecified essential hypertension and recent 20 pound weight loss, and recent GI visit presenting today with increasing epigastric pain and weight loss.  The epigastric pain has been going on for the last month. She reports that her weight loss has been recent. Patient has EGD and colonoscopy scheduled with GI physicians in the month of November. Patient report reports increasing epigastric pain which is why she is here today. Patient was initially started on pantoprazole. She has been taking this for 2 days.  Patient endorses nausea. Normal bowel movements.  Past Medical History:  Diagnosis Date  . Anemia   . Arthritis   . Colon polyps   . Olecranon bursitis    Elbow  . Other and unspecified hyperlipidemia   . Palpitations   . Unspecified essential hypertension     Patient Active Problem List   Diagnosis Date Noted  . 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 Liver nodule 04/08/2016  . AKI (acute kidney injury) ([facility]) 04/08/2016  . Abdominal pain 04/08/2016  . Abdominal mass 04/08/2016  . Loss of weight 03/04/2016  . Routine health maintenance 12/25/2012  . Hyperlipidemia 06/21/2007  . Essential hypertension 06/21/2007  . Palpitations 06/21/2007    Past Surgical History:  Procedure Laterality Date  . COLONOSCOPY W/ POLYPECTOMY  01/2004  . CRYOTHERAPY     female-cryosurgery stated by pt  . POLYPECTOMY     from vocal surgery    OB History    No data available       Home Medications    Prior to Admission medications   Medication Sig Start Date End Date Taking? Authorizing Provider  Cholecalciferol (VITAMIN D3) 5000  units CAPS Take 5,000 Units by mouth daily.    Yes Historical Provider, MD  ferrous sulfate 325 (65 FE) MG tablet Take 325 mg by mouth daily with breakfast.   Yes Historical Provider, MD  lisinopril-hydrochlorothiazide (PRINZIDE,ZESTORETIC) 10-12.5 MG tablet TAKE 1 TABLET BY MOUTH DAILY. 04/30/15  Yes Hoyt Koch, MD  pantoprazole (PROTONIX) 40 MG tablet Take 1 tablet (40 mg total) by mouth daily. 03/12/16  Yes Hoyt Koch, MD  polyethylene glycol [facility] / GLYCOLAX) packet Take 17 g by mouth daily.   Yes Historical Provider, MD  VYTORIN 10-20 MG tablet TAKE 1 TABLET BY MOUTH AT BEDTIME. 03/26/15  Yes Hoyt Koch, MD    Family History Family History  Problem Relation Age of Onset  . Gallstones Mother   . Aneurysm Father     brain  . Heart disease Father     CAD  . Breast cancer Sister   . Breast cancer Sister   . Heart attack Brother   . Cancer Brother   . Lung cancer Son   . Diabetes Neg Hx     Social History Social History  Substance Use Topics  . Smoking status: Former Smoker    Packs/day: 0.50    Years: 50.00    Types: Cigarettes    Quit date: 10/31/1994  . Smokeless tobacco: Never Used     Comment: SMOKED SOME; QUIT 1996;   . Alcohol use 0.0 oz/week  Comment: wine     Allergies   Review of patient's allergies indicates no known allergies.   Review of Systems Review of Systems  Constitutional: Positive for fatigue. Negative for fever.  Gastrointestinal: Positive for abdominal pain and nausea. Negative for anal bleeding, blood in stool, constipation and vomiting.  Neurological: Positive for weakness.  All other systems reviewed and are negative.    Physical Exam Updated Vital Signs BP (!) 110/54 (BP Location: Left Arm)   Pulse 76   Temp 99.1 F (37.3 C) (Oral)   Resp 16   Wt 153 lb 12.8 oz (69.8 kg)   SpO2 99%   BMI 24.45 kg/m   Physical Exam  Constitutional: She is oriented to person, place, and time. She appears  well-developed and well-nourished.  HENT:  Head: Normocephalic and atraumatic.  Eyes: Right eye exhibits no discharge.  Cardiovascular: Normal rate, regular rhythm and normal heart sounds.   No murmur heard. Pulmonary/Chest: Effort normal and breath sounds normal. She has no wheezes. She has no rales.  Abdominal: Soft. She exhibits no distension. There is tenderness.  Epigastric and left upper quadrant tenderness.  Neurological: She is oriented to person, place, and time.  Skin: Skin is warm and dry. She is not diaphoretic.  Psychiatric: She has a normal mood and affect.  Nursing note and vitals reviewed.    ED Treatments / Results  Labs (all labs ordered are listed, but only abnormal results are displayed) Labs Reviewed  COMPREHENSIVE METABOLIC PANEL - Abnormal; Notable for the following:       Result Value   Glucose, Bld 105 (*)    Creatinine, Ser 1.39 (*)    Albumin 3.1 (*)    ALT 11 (*)    GFR calc non Af Amer 35 (*)    GFR calc Af Amer 40 (*)    All other components within normal limits  CBC - Abnormal; Notable for the following:    WBC 12.1 (*)    RBC 3.41 (*)    Hemoglobin 10.0 (*)    HCT 32.5 (*)    All other components within normal limits  LIPASE, BLOOD  URINALYSIS, ROUTINE W REFLEX MICROSCOPIC (NOT AT [facility])  I-STAT TROPOININ, ED  I-STAT BETA HCG BLOOD, ED ([facility], [facility], [facility] ONLY)    EKG  EKG Interpretation None       Radiology Ct Abdomen Pelvis W Contrast  Result Date: 04/08/2016 CLINICAL DATA:  Epigastric pain, anemia, colon polyps, weight loss EXAM: CT ABDOMEN AND PELVIS WITH CONTRAST TECHNIQUE: Multidetector CT imaging of the abdomen and pelvis was performed using the standard protocol following bolus administration of intravenous contrast. CONTRAST:  38mL ISOVUE-300 IOPAMIDOL (ISOVUE-300) INJECTION 61% COMPARISON:  None. FINDINGS: Lower chest: Lung bases are unremarkable Hepatobiliary: Multiple nodular densities are noted throughout the liver the largest in  left hepatic lobe measures 2 cm findings are consistent with metastatic disease. Pancreas: Enhanced pancreas is unremarkable. Spleen: Enhanced spleen is unremarkable. There is a nodular lesion just medial to the spleen measures 3.9 cm highly suspicious for metastatic disease. Adrenals/Urinary Tract: No adrenal gland mass. Kidneys are symmetrical in size and enhancement. No hydronephrosis or hydroureter. Delayed renal images shows bilateral renal symmetrical excretion. Stomach/Bowel: There is large exophytic infiltrating mass in proximal lateral wall of the stomach. The mass measures at least 10 by 7.4 cm. There is intraluminal debris within mass and the lumen communicates with main gastric lumen. Findings highly suspicious for malignant gastrointestinal stromal tumor. No small bowel obstruction. There is no  evidence of free abdominal air. Vascular/Lymphatic: No aortic aneurysm. Atherosclerotic plaques and calcifications are noted abdominal aorta and iliac arteries. There is anterior omental lesion measures at least 6.2 x 4.3 cm. Second omental lesion axial image 46 measures 4.3 by 3.8 cm. Additional smaller omental nodules are noted the largest measures 5 cm. Findings are highly suspicious for omental caking/ metastatic disease. There is a lesion in right cul-de-sac measures 2.5 cm highly suspicious for drop metastasis. Reproductive: The uterus is atrophic.  No adnexal masses noted. Other: The urinary bladder is unremarkable. There is no abdominal ascites. Musculoskeletal: No destructive bony lesions are noted. Disc space flattening with vacuum disc phenomenon noted at L5-S1 level. IMPRESSION: 1. There is large exophytic mass in proximal lateral stomach measures at least 10 x 7.4 cm highly suspicious for malignant gastric tumor. 2. There are nodular lesions within omentum and just medial to the spleen highly suspicious for metastatic disease. 3. Multiple liver nodules are noted consistent with metastatic disease. 4.  No hydronephrosis or hydroureter. 5. No small bowel obstruction. These results were called by telephone at the time of interpretation on 04/08/2016 at 12:54 pm to Dr. Zenovia Jarred , who verbally acknowledged these results. Electronically Signed   By: Lahoma Crocker M.D.   On: 04/08/2016 12:56    Procedures Procedures (including critical care time)  Medications Ordered in ED Medications  Vitamin D3 CAPS 5,000 Units (not administered)  docusate sodium (COLACE) capsule 100 mg (not administered)  pantoprazole (PROTONIX) EC tablet 40 mg (not administered)  ezetimibe-simvastatin (VYTORIN) 10-20 MG per tablet 1 tablet (not administered)  0.9 %  sodium chloride infusion ( Intravenous New Bag/Given 04/08/16 1507)  acetaminophen (TYLENOL) tablet 650 mg (not administered)    Or  acetaminophen (TYLENOL) suppository 650 mg (not administered)  senna-docusate (Senokot-S) tablet 1 tablet (not administered)  ondansetron (ZOFRAN) tablet 4 mg (not administered)    Or  ondansetron (ZOFRAN) injection 4 mg (not administered)  ferrous sulfate tablet 325 mg (not administered)  gi cocktail (Maalox,Lidocaine,Donnatal) (30 mLs Oral Given 04/08/16 1156)  iopamidol (ISOVUE-300) 61 % injection (75 mLs  Contrast Given 04/08/16 1217)     Initial Impression / Assessment and Plan / ED Course  I have reviewed the triage vital signs and the nursing notes.  Pertinent labs & imaging results that were available during my care of the patient were reviewed by me and considered in my medical decision making (see chart for details).  Clinical Course     Mrs. Fregoso is an 82 year old female with a past medical history of anemia, arthritis, colon polyps, hyperlipidemia and unspecified essential hypertension presenting today with acute on chronic abdominal pain. Patient's been having Epigastric pain and weight loss for the last several months. She reports this been getting worse even despite starting pantoprazole. We will do  labs, including lipase and liver enzymes. Patient is significantly tender on exam. We'll get CT abdomen pelvis. Patient's symptoms sound more consistent with peptic ulcers disease versus gastritis vs neoplasm. If CT is normal will have her continue to take pantoprazole given some time and give Maalox over-the-counter as needed.  CT found new gastric mass. Talked tooncology that recommends inpatient work up and biopsy. Let GI know (they saw her a week ago) and also called her PMD.   Will admit to hospitlist.     Final Clinical Impressions(s) / ED Diagnoses   Final diagnoses:  Abdominal pain, unspecified abdominal location    New Prescriptions Current Discharge Medication List  Courteney Julio Alm, MD 04/08/16 [id number]

## 2016-04-09 ENCOUNTER — Encounter (HOSPITAL_COMMUNITY)
Admission: EM | Disposition: A | Discharge status: Home / Self Care | Payer: Self-pay | Source: Home / Self Care | Attending: Internal Medicine

## 2016-04-09 ENCOUNTER — Inpatient Hospital Stay (HOSPITAL_COMMUNITY): Payer: Medicare Other

## 2016-04-09 ENCOUNTER — Encounter (HOSPITAL_COMMUNITY): Payer: Self-pay

## 2016-04-09 DIAGNOSIS — D4989 Neoplasm of unspecified behavior of other specified sites: Secondary | ICD-10-CM

## 2016-04-09 HISTORY — PX: ESOPHAGOGASTRODUODENOSCOPY: SHX5428

## 2016-04-09 LAB — CBC
HCT: 29.3 % — ABNORMAL LOW (ref 36.0–46.0)
HEMOGLOBIN: 8.9 g/dL — AB (ref 12.0–15.0)
MCH: 28.8 pg (ref 26.0–34.0)
MCHC: 30.4 g/dL (ref 30.0–36.0)
MCV: 94.8 fL (ref 78.0–100.0)
PLATELETS: 351 10*3/uL (ref 150–400)
RBC: 3.09 MIL/uL — AB (ref 3.87–5.11)
RDW: 14.5 % (ref 11.5–15.5)
WBC: 8.2 10*3/uL (ref 4.0–10.5)

## 2016-04-09 LAB — COMPREHENSIVE METABOLIC PANEL
ALBUMIN: 2.6 g/dL — AB (ref 3.5–5.0)
ALT: 8 U/L — AB (ref 14–54)
AST: 21 U/L (ref 15–41)
Alkaline Phosphatase: 64 U/L (ref 38–126)
Anion gap: 10 (ref 5–15)
BUN: 12 mg/dL (ref 6–20)
CHLORIDE: 104 mmol/L (ref 101–111)
CO2: 28 mmol/L (ref 22–32)
CREATININE: 1.33 mg/dL — AB (ref 0.44–1.00)
Calcium: 8.8 mg/dL — ABNORMAL LOW (ref 8.9–10.3)
GFR calc non Af Amer: 37 mL/min — ABNORMAL LOW (ref >60)
GFR, EST AFRICAN AMERICAN: 43 mL/min — AB (ref >60)
GLUCOSE: 99 mg/dL (ref 65–99)
Potassium: 3.4 mmol/L — ABNORMAL LOW (ref 3.5–5.1)
SODIUM: 142 mmol/L (ref 135–145)
Total Bilirubin: 0.6 mg/dL (ref 0.3–1.2)
Total Protein: 6.1 g/dL — ABNORMAL LOW (ref 6.5–8.1)

## 2016-04-09 SURGERY — EGD (ESOPHAGOGASTRODUODENOSCOPY)
Anesthesia: Moderate Sedation

## 2016-04-09 MED ORDER — POTASSIUM CHLORIDE 10 MEQ/100ML IV SOLN: 10.0000 meq | Freq: Once | INTRAVENOUS | Status: DC

## 2016-04-09 MED ORDER — BUTAMBEN-TETRACAINE-BENZOCAINE 2-2-14 % EX AERO
INHALATION_SPRAY | CUTANEOUS | Status: DC | PRN
  Administered 2016-04-09: 1 via TOPICAL

## 2016-04-09 MED ORDER — FENTANYL CITRATE (PF) 100 MCG/2ML IJ SOLN
INTRAMUSCULAR | Status: AC
  Filled 2016-04-09: qty 2

## 2016-04-09 MED ORDER — MIDAZOLAM HCL 10 MG/2ML IJ SOLN
INTRAMUSCULAR | Status: DC | PRN
  Administered 2016-04-09 (×3): 1 mg via INTRAVENOUS
  Administered 2016-04-09 (×2): 2 mg via INTRAVENOUS
  Administered 2016-04-09: 1 mg via INTRAVENOUS

## 2016-04-09 MED ORDER — MIDAZOLAM HCL 5 MG/ML IJ SOLN
INTRAMUSCULAR | Status: AC
  Filled 2016-04-09: qty 2

## 2016-04-09 MED ORDER — IOPAMIDOL (ISOVUE-300) INJECTION 61%
INTRAVENOUS | Status: AC
  Administered 2016-04-09: 75 mL via INTRAVENOUS
  Filled 2016-04-09: qty 75

## 2016-04-09 MED ORDER — FENTANYL CITRATE (PF) 100 MCG/2ML IJ SOLN
INTRAMUSCULAR | Status: DC | PRN
  Administered 2016-04-09: 25 ug via INTRAVENOUS

## 2016-04-09 MED ORDER — SODIUM CHLORIDE 0.9 % IV SOLN
INTRAVENOUS | Status: DC
  Administered 2016-04-09: 16:00:00 via INTRAVENOUS

## 2016-04-09 NOTE — Op Note (Signed)
[facility] Patient Name: Haley Mcmillan Procedure Date : 04/09/2016 MRN: [id number] Attending MD: Estill Cotta. Loletha Mcmillan , MD Date of Birth: 07/31/1933 CSN: [id number] Age: 82 Admit Type: Inpatient Procedure:                Upper GI endoscopy Indications:              Epigastric abdominal pain, Iron deficiency anemia                            secondary to chronic blood loss, Abnormal CT of the                            GI tract, Weight loss, gastric mass Providers:                Mallie Mussel L. Loletha Carrow, MD, Cleda Daub, RN, Elspeth Cho Tech., Technician Referring MD:              Medicines:                Fentanyl 25 micrograms IV, Midazolam 8 mg IV,                            Cetacaine spray Complications:            No immediate complications. Estimated Blood Loss:     Estimated blood loss was minimal. Procedure:                Pre-Anesthesia Assessment:                           - Prior to the procedure, a History and Physical                            was performed, and patient medications and                            allergies were reviewed. The patient's tolerance of                            previous anesthesia was also reviewed. The risks                            and benefits of the procedure and the sedation                            options and risks were discussed with the patient.                            All questions were answered, and informed consent                            was obtained. Prior Anticoagulants: The patient has  taken no previous anticoagulant or antiplatelet                            agents. ASA Grade Assessment: III - A patient with                            severe systemic disease. After reviewing the risks                            and benefits, the patient was deemed in                            satisfactory condition to undergo the procedure.                           After  obtaining informed consent, the endoscope was                            passed under direct vision. Throughout the                            procedure, the patient's blood pressure, pulse, and                            oxygen saturations were monitored continuously. The                            [id number] scope was introduced through the                            mouth, and advanced to the duodenal bulb. The upper                            GI endoscopy was accomplished without difficulty.                            The patient tolerated the procedure. Scope In: Scope Out: Findings:      The esophagus was normal.      A large, fungating and infiltrative, very deeply-ulcerated and necrotic,       malignant-appearing mass with stigmata of recent bleeding was found in       the gastric fundus. Biopsies were taken with a cold forceps for       histology.      The duodenal bulb was normal. Impression:               - Normal esophagus.                           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 extensively biopsied.                           - Normal  duodenal bulb. Moderate Sedation:      Moderate (conscious) sedation was administered by the endoscopy nurse       and supervised by the endoscopist. The following parameters were       monitored: oxygen saturation, heart rate, blood pressure, respiratory       rate, EKG, adequacy of pulmonary ventilation, and response to care.       Total physician intraservice time was 16 minutes. Recommendation:           - Soft diet.                           - Continue present medications.                           - Await pathology results.                           Oncology consult Procedure Code(s):        --- Professional ---                           [phone], Esophagogastroduodenoscopy, flexible,                            transoral; with biopsy, single or multiple                            99152, Moderate sedation services provided by the                            same physician or other qualified health care                            professional performing the diagnostic or                            therapeutic service that the sedation supports,                            requiring the presence of an independent trained                            observer to assist in the monitoring of the                            patient's level of consciousness and physiological                            status; initial 15 minutes of intraservice time,                            patient age 19 years or older Diagnosis Code(s):        --- Professional ---                           D49.0, Neoplasm of unspecified behavior of  digestive system                           R10.13, Epigastric pain                           D50.0, Iron deficiency anemia secondary to blood                            loss (chronic)                           R63.4, Abnormal weight loss                           R93.3, Abnormal findings on diagnostic imaging of                            other parts of digestive tract CPT copyright 2016 [employer]. All rights reserved. The codes documented in this report are preliminary and upon coder review may  be revised to meet current compliance requirements. Haley Zeek L. Loletha Carrow, MD 04/09/2016 3:40:40 PM This report has been signed electronically. Number of Addenda: 0

## 2016-04-09 NOTE — Interval H&P Note (Signed)
History and Physical Interval Note:  04/09/2016 2:56 PM  Haley Mcmillan  has presented today for surgery, with the diagnosis of gastric mass  The various methods of treatment have been discussed with the patient and family. After consideration of risks, benefits and other options for treatment, the patient has consented to  Procedure(s): ESOPHAGOGASTRODUODENOSCOPY (EGD) (N/A) as a surgical intervention .  The patient's history has been reviewed, patient examined, no change in status, stable for surgery.  I have reviewed the patient's chart and labs.  Questions were answered to the patient's satisfaction.     [name]

## 2016-04-09 NOTE — Progress Notes (Signed)
PROGRESS NOTE    Haley Mcmillan  [id number] DOB: [birth year] DOA: 04/08/2016 PCP: Hoyt Koch, MD    Brief Narrative: Haley Mcmillan is a 82 y.o. female with medical history significant for anemia, arthritis, colonic polyps, hyperlipidemia, hypertension presents to the emergency department after recent GI visit with persistent worsening epigastric pain and unintentional weight loss. Initial evaluation reveals a gastric mass and liver nodules concerning for malignancy and metastases.  Information is obtained from the patient and the chart. She reports intermittent epigastric pain for the last month. Associated symptoms include nausea without emesis and decreased oral intake. She reports a 20 pound weight loss as a result. She takes pantoprazole with intermittent relief. Last 2 days pain worse and she called her PCP who recommended she come to the emergency department. She denies diarrhea but does endorse a propensity for constipation and takes Miralax daily. He denies dysuria hematuria frequency or urgency but does endorse occasional difficulty initiating flow. She denies headache dizziness syncope or near-syncope. She denies lower extremity edema or orthopnea   Assessment & Plan:   Principal Problem:   Neoplasm of abdomen Active Problems:   Hyperlipidemia   Essential hypertension   Loss of weight   Liver nodule   AKI (acute kidney injury) ([place])   Abdominal pain   Abdominal mass  1. Abdominal mass/neoplasm of the abdomen. CT reveals large exophytic mass in proximal lateral stomach measures at least 10 x 7.4 cm highly suspicious for malignantgastric tumor. Adjacent to omentum also liver nodules concerning for metastases -Evaluated by GI who opined gastric tumor with likely omental and liver mets -EGD today. . GI note indicates this will be conscious sedation not MAC -Holding tomorrow's iron per GI.   2. Hypertension. Controlled in the emergency department. Home  medications include lisinopril, hydrochlorothiazide -We'll hold home meds for now -Gentle IV fluids  3. Acute kidney injury. Creatinine 1.3. Likely related to above as well as medications. -Hold nephrotoxins -Gentle IV fluids -Monitor urine output  4. Abdominal pain/nausea. Likely related to above -Zofran as needed  5-hypokalemia; replete times one    DVT prophylaxis: scd.  Code Status: full code.  Family Communication: husband at bedside.  Disposition Plan: endoscopy today   Consultants:   [facility]    Procedures:   Endoscopy 10-25   Antimicrobials:  none   Subjective: Feeling ok, report melena since she has been taking iron supplement.   Objective: Vitals:   04/08/16 1345 04/08/16 1458 04/08/16 2106 04/09/16 0508  BP: 130/62 (!) 110/54 (!) 102/49 104/65  Pulse: 68 76 66 (!) 58  Resp: 16  17 18   Temp:  99.1 F (37.3 C) 98.6 F (37 C) 98.3 F (36.8 C)  TempSrc:  Oral Oral Oral  SpO2: 99% 99% 97% 96%  Weight:        Intake/Output Summary (Last 24 hours) at 04/09/16 1159 Last data filed at 04/09/16 1141  Gross per 24 hour  Intake           324.17 ml  Output             1450 ml  Net         -1125.83 ml   Filed Weights   04/08/16 1056  Weight: 69.8 kg (153 lb 12.8 oz)    Examination:  General exam: Appears calm and comfortable  Respiratory system: Clear to auscultation. Respiratory effort normal. Cardiovascular system: S1 & S2 heard, RRR. No JVD, murmurs, rubs, gallops or clicks. No pedal edema. Gastrointestinal  system: Abdomen is nondistended, soft and nontender. No organomegaly or masses felt. Normal bowel sounds heard. Central nervous system: Alert and oriented. No focal neurological deficits. Extremities: Symmetric 5 x 5 power. Skin: No rashes, lesions or ulcers Psychiatry: Judgement and insight appear normal. Mood & affect appropriate.     Data Reviewed: I have personally reviewed following labs and imaging studies  CBC:  Recent  Labs Lab 04/08/16 1101 04/09/16 0427  WBC 12.1* 8.2  HGB 10.0* 8.9*  HCT 32.5* 29.3*  MCV 95.3 94.8  PLT 385 [id number]   Basic Metabolic Panel:  Recent Labs Lab 04/08/16 1101 04/09/16 0427  NA 141 142  K 4.1 3.4*  CL 103 104  CO2 28 28  GLUCOSE 105* 99  BUN 14 12  CREATININE 1.39* 1.33*  CALCIUM 9.6 8.8*   GFR: Estimated Creatinine Clearance: 32.2 mL/min (by C-G formula based on SCr of 1.33 mg/dL (H)). Liver Function Tests:  Recent Labs Lab 04/08/16 1101 04/09/16 0427  AST 25 21  ALT 11* 8*  ALKPHOS 75 64  BILITOT 0.7 0.6  PROT 7.0 6.1*  ALBUMIN 3.1* 2.6*    Recent Labs Lab 04/08/16 1101  LIPASE 28   No results for input(s): AMMONIA in the last 168 hours. Coagulation Profile: No results for input(s): INR, PROTIME in the last 168 hours. Cardiac Enzymes: No results for input(s): CKTOTAL, CKMB, CKMBINDEX, TROPONINI in the last 168 hours. BNP (last 3 results) No results for input(s): PROBNP in the last 8760 hours. HbA1C: No results for input(s): HGBA1C in the last 72 hours. CBG: No results for input(s): GLUCAP in the last 168 hours. Lipid Profile: No results for input(s): CHOL, HDL, LDLCALC, TRIG, CHOLHDL, LDLDIRECT in the last 72 hours. Thyroid Function Tests: No results for input(s): TSH, T4TOTAL, FREET4, T3FREE, THYROIDAB in the last 72 hours. Anemia Panel: No results for input(s): VITAMINB12, FOLATE, FERRITIN, TIBC, IRON, RETICCTPCT in the last 72 hours. Sepsis Labs: No results for input(s): PROCALCITON, LATICACIDVEN in the last 168 hours.  No results found for this or any previous visit (from the past 240 hour(s)).       Radiology Studies: Ct Chest W Contrast  Result Date: 04/09/2016 CLINICAL DATA:  82 year old female admitted with epigastric abdominal pain, weight loss, large gastric mass and findings of metastatic disease in the liver and peritoneal cavity. Chest staging. EXAM: CT CHEST WITH CONTRAST TECHNIQUE: Multidetector CT imaging of the  chest was performed during intravenous contrast administration. CONTRAST:  75 cc ISOVUE-300 IOPAMIDOL (ISOVUE-300) INJECTION 61% COMPARISON:  04/08/2016 CT abdomen/ pelvis. FINDINGS: Cardiovascular: Top-normal heart size. No significant pericardial fluid/thickening. Atherosclerotic nonaneurysmal thoracic aorta. Top-normal caliber main pulmonary artery (3.2 cm diameter). No central pulmonary emboli. Mediastinum/Nodes: No discrete thyroid nodules. Unremarkable esophagus. No pathologically enlarged axillary, mediastinal or hilar lymph nodes. Subcentimeter coarsely calcified granulomatous nodes in the bilateral hilum. Lungs/Pleura: No pneumothorax. No pleural effusion. Peripheral right upper lobe 3 mm solid pulmonary nodule (series 7/ image 50). There is mass-effect on the basilar left lower lobe by a 1.9 x 1.6 cm mass at the superior margin of the spleen (series 3/ image 103), favored to represent a peritoneal metastasis. No acute consolidative airspace disease or additional significant pulmonary nodules. Upper abdomen: Re- demonstrated are numerous (greater than 10) hypodense masses scattered throughout the visualized liver measuring up to the 1.9 x 1.7 cm in the lateral segment left liver lobe (series 3/ image 132). Re- demonstrated and partially visualized is a large irregular exophytic mass arising from the proximal stomach. There  are several masses in the left upper quadrant between the spleen and gastric fundus measuring up to the 4.0 x 3.5 cm (series 3/ image 121). Musculoskeletal: No aggressive appearing focal osseous lesions. Mild thoracic spondylosis. IMPRESSION: 1. No definite findings of metastatic disease in the chest. 2. Solitary 3 mm peripheral right upper lobe pulmonary nodule, for which a follow-up chest CT is advised in 3 months. 3. Re- demonstration of large irregular exophytic proximal gastric mass most consistent with primary gastric carcinoma. 4. Re- demonstration of innumerable liver masses most  consistent with liver metastases. 5. Re- demonstration of multiple peritoneal masses in the left upper quadrant most consistent with peritoneal metastases. 6. Aortic atherosclerosis. Electronically Signed   By: Ilona Sorrel M.D.   On: 04/09/2016 09:26   Ct Abdomen Pelvis W Contrast  Result Date: 04/08/2016 CLINICAL DATA:  Epigastric pain, anemia, colon polyps, weight loss EXAM: CT ABDOMEN AND PELVIS WITH CONTRAST TECHNIQUE: Multidetector CT imaging of the abdomen and pelvis was performed using the standard protocol following bolus administration of intravenous contrast. CONTRAST:  51mL ISOVUE-300 IOPAMIDOL (ISOVUE-300) INJECTION 61% COMPARISON:  None. FINDINGS: Lower chest: Lung bases are unremarkable Hepatobiliary: Multiple nodular densities are noted throughout the liver the largest in left hepatic lobe measures 2 cm findings are consistent with metastatic disease. Pancreas: Enhanced pancreas is unremarkable. Spleen: Enhanced spleen is unremarkable. There is a nodular lesion just medial to the spleen measures 3.9 cm highly suspicious for metastatic disease. Adrenals/Urinary Tract: No adrenal gland mass. Kidneys are symmetrical in size and enhancement. No hydronephrosis or hydroureter. Delayed renal images shows bilateral renal symmetrical excretion. Stomach/Bowel: There is large exophytic infiltrating mass in proximal lateral wall of the stomach. The mass measures at least 10 by 7.4 cm. There is intraluminal debris within mass and the lumen communicates with main gastric lumen. Findings highly suspicious for malignant gastrointestinal stromal tumor. No small bowel obstruction. There is no evidence of free abdominal air. Vascular/Lymphatic: No aortic aneurysm. Atherosclerotic plaques and calcifications are noted abdominal aorta and iliac arteries. There is anterior omental lesion measures at least 6.2 x 4.3 cm. Second omental lesion axial image 46 measures 4.3 by 3.8 cm. Additional smaller omental nodules are  noted the largest measures 5 cm. Findings are highly suspicious for omental caking/ metastatic disease. There is a lesion in right cul-de-sac measures 2.5 cm highly suspicious for drop metastasis. Reproductive: The uterus is atrophic.  No adnexal masses noted. Other: The urinary bladder is unremarkable. There is no abdominal ascites. Musculoskeletal: No destructive bony lesions are noted. Disc space flattening with vacuum disc phenomenon noted at L5-S1 level. IMPRESSION: 1. There is large exophytic mass in proximal lateral stomach measures at least 10 x 7.4 cm highly suspicious for malignant gastric tumor. 2. There are nodular lesions within omentum and just medial to the spleen highly suspicious for metastatic disease. 3. Multiple liver nodules are noted consistent with metastatic disease. 4. No hydronephrosis or hydroureter. 5. No small bowel obstruction. These results were called by telephone at the time of interpretation on 04/08/2016 at 12:54 pm to Dr. Zenovia Jarred , who verbally acknowledged these results. Electronically Signed   By: Lahoma Crocker M.D.   On: 04/08/2016 12:56        Scheduled Meds: . cholecalciferol  5,000 Units Oral Daily  . docusate sodium  100 mg Oral Daily  . ezetimibe-simvastatin  1 tablet Oral QHS  . pantoprazole  40 mg Oral Daily   Continuous Infusions: . sodium chloride  LOS: 1 day    Time spent: 35 minutes.     Elmarie Shiley, MD [facility] Pager [phone]  If 7PM-7AM, please contact night-coverage [email or web address] Password [email or web address] 04/09/2016, 11:59 AM

## 2016-04-09 NOTE — H&P (View-Only) (Signed)
[facility] Consult: 1:52 PM 04/08/2016  LOS: 0 days    Referring Provider: Dr Thomasene Lot in ED  Primary Care Physician:  Hoyt Koch, MD Primary Gastroenterologist: [facility].   Dr Ardis Hughs    Reason for Consultation:  Gastric mass.    HPI: Haley Mcmillan is a 82 y.o. female.  PMH anemia.  HLD.  HTN.   2007 Colonoscopy: inflamed hyperplastic polyps.  No further screening colonoscopies recommended.   Seen 10/5 by GI APP for anemia, wt loss.  Hgb 9.6 on 9/19, 8.3 on 9/22 was 8.3.  Pt had reported dark stool to her PMD who initiated po iron and daily PPI.  Pt reported felling better and having constipation to GI APP.  20 # of unintentional wt loss in 1 year but pt attributed this to "getting a puppy in April of this year which made her so distracted that she forgot to eat".  Pt did not endorse abd pain, n/v.  Pt was set up for EGD 11/21.  Miralax and colace added.    Presenting to ED today with progressive epigastric pain for 7 to 10 days.  No n/v.  Regular BMs with daily Miralax.  .   CT scan unfortunately shows 10 x 7.4 cm exophytic gastric mass suspicious for gastric tumor.  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  Her Hgb has risen to 10.      Past Medical History:  Diagnosis Date  . Anemia   . Arthritis   . Colon polyps   . Olecranon bursitis    Elbow  . Other and unspecified hyperlipidemia   . Palpitations   . Unspecified essential hypertension     Past Surgical History:  Procedure Laterality Date  . COLONOSCOPY W/ POLYPECTOMY  01/2004  . CRYOTHERAPY     female-cryosurgery stated by pt  . POLYPECTOMY     from vocal surgery    Prior to Admission medications   Medication Sig Start Date End Date Taking? Authorizing Provider  Cholecalciferol (VITAMIN D3) 5000 units CAPS Take 1 capsule by  mouth daily.    Historical Provider, MD  docusate sodium (COLACE) 100 MG capsule Take 100 mg by mouth daily.    Historical Provider, MD  ferrous sulfate 325 (65 FE) MG tablet Take 325 mg by mouth daily with breakfast.    Historical Provider, MD  lisinopril-hydrochlorothiazide (PRINZIDE,ZESTORETIC) 10-12.5 MG tablet TAKE 1 TABLET BY MOUTH DAILY. 04/30/15   Hoyt Koch, MD  pantoprazole (PROTONIX) 40 MG tablet Take 1 tablet (40 mg total) by mouth daily. 03/12/16   Hoyt Koch, MD  VYTORIN 10-20 MG tablet TAKE 1 TABLET BY MOUTH AT BEDTIME. 03/26/15   Hoyt Koch, MD    Scheduled Meds:  Infusions:  PRN Meds:    Allergies as of 04/08/2016  . (No Known Allergies)    Family History  Problem Relation Age of Onset  . Gallstones Mother   . Aneurysm Father     brain  . Heart disease Father     CAD  . Breast cancer Sister   .  Breast cancer Sister   . Heart attack Brother   . Cancer Brother   . Lung cancer Son   . Diabetes Neg Hx     Social History   Social History  . Marital status: Married    Spouse name: N/A  . Number of children: 1  . Years of education: N/A   Occupational History  . retired Retired   Social History Main Topics  . Smoking status: Former Smoker    Packs/day: 0.50    Years: 50.00    Types: Cigarettes    Quit date: 10/31/1994  . Smokeless tobacco: Never Used     Comment: SMOKED SOME; QUIT 1996;   . Alcohol use 0.0 oz/week     Comment: wine  . Drug use: No  . Sexual activity: Not on file   Other Topics Concern  . 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             REVIEW OF SYSTEMS: Constitutional:  Some weakness and fatigue, not profound ENT:  No nose bleeds Pulm:  No cough or dyspnea CV:  No CP, no LE edema.  Before starting po iron had incidence of palpitations with exertion,.  GU:  No hematuria, no frequency GI:  No dysphagia.  See HPI Heme:  No unusual bleeding or bruising   Transfusions:  none Neuro:  No headaches, no peripheral tingling or numbness Derm:  No itching, no rash or sores.  Endocrine:  No sweats or chills.  No polyuria or dysuria Immunization:  reviewed Travel:  None beyond local counties in last few months.    PHYSICAL EXAM: Vital signs in last 24 hours: Vitals:   04/08/16 1127 04/08/16 1253  BP: 117/59 129/62  Pulse: 72 61  Resp: 18 14  Temp:     Wt Readings from Last 3 Encounters:  04/08/16 69.8 kg (153 lb 12.8 oz)  03/20/16 72.2 kg (159 lb 4 oz)  03/07/16 73.9 kg (163 lb)    General: pleasant, somewhat ill looking AAF.  Comfortable but a bit anxious Head:  No asymmetry or swellling  Eyes:  No icterus or pallor Ears:  Not HOH  Nose:  No congestion or discharge Mouth:  Clear and moist oropharynx.  tonguw midline Neck:  No mass, no TMG.  No JVD. Lungs:  Clear bil.  No cough or dyspnea Heart: RRR.  No mrg Abdomen:  Soft, NT, ND.  No mass or HSM.  No hernia.  No bruits.   Rectal: deferred.     Musc/Skeltl: no joint swelling, deformity, erythema Extremities:  No CCE  Neurologic:  Oriented x 3, fully alert.  Moves all 4 limbs Skin:  No rash or sores. Tattoos:  none   Psych:  Appears worried/anxious.  Cooperative.   Appropriate.    Intake/Output from previous day: No intake/output data recorded. Intake/Output this shift: No intake/output data recorded.  LAB RESULTS:  Recent Labs  04/08/16 1101  WBC 12.1*  HGB 10.0*  HCT 32.5*  PLT 385   BMET Lab Results  Component Value Date   NA 141 04/08/2016   NA 141 03/04/2016   NA 143 04/24/2015   K 4.1 04/08/2016   K 4.8 03/04/2016   K 4.6  04/24/2015   CL 103 04/08/2016   CL 105 03/04/2016   CL 105 04/24/2015   CO2 28 04/08/2016   CO2 33 (H) 03/04/2016   CO2 30 04/24/2015   GLUCOSE 105 (H)  04/08/2016   GLUCOSE 107 (H) 03/04/2016   GLUCOSE 115 (H) 04/24/2015   BUN 14 04/08/2016   BUN 36 (H) 03/04/2016   BUN 15 04/24/2015   CREATININE 1.39 (H) 04/08/2016   CREATININE 1.16 03/04/2016   CREATININE 1.18 04/24/2015   CALCIUM 9.6 04/08/2016   CALCIUM 9.0 03/04/2016   CALCIUM 10.0 04/24/2015   LFT  Recent Labs  04/08/16 1101  PROT 7.0  ALBUMIN 3.1*  AST 25  ALT 11*  ALKPHOS 75  BILITOT 0.7   PT/INR No results found for: INR, PROTIME Hepatitis Panel No results for input(s): HEPBSAG, HCVAB, HEPAIGM, HEPBIGM in the last 72 hours. C-Diff No components found for: CDIFF Lipase     Component Value Date/Time   LIPASE 28 04/08/2016 1101    Drugs of Abuse  No results found for: LABOPIA, COCAINSCRNUR, LABBENZ, AMPHETMU, THCU, LABBARB   RADIOLOGY STUDIES: Ct Abdomen Pelvis W Contrast  Result Date: 04/08/2016 CLINICAL DATA:  Epigastric pain, anemia, colon polyps, weight loss EXAM: CT ABDOMEN AND PELVIS WITH CONTRAST TECHNIQUE: Multidetector CT imaging of the abdomen and pelvis was performed using the standard protocol following bolus administration of intravenous contrast. CONTRAST:  67mL ISOVUE-300 IOPAMIDOL (ISOVUE-300) INJECTION 61% COMPARISON:  None. FINDINGS: Lower chest: Lung bases are unremarkable Hepatobiliary: Multiple nodular densities are noted throughout the liver the largest in left hepatic lobe measures 2 cm findings are consistent with metastatic disease. Pancreas: Enhanced pancreas is unremarkable. Spleen: Enhanced spleen is unremarkable. There is a nodular lesion just medial to the spleen measures 3.9 cm highly suspicious for metastatic disease. Adrenals/Urinary Tract: No adrenal gland mass. Kidneys are symmetrical in size and enhancement. No hydronephrosis or hydroureter. Delayed renal images shows bilateral renal symmetrical excretion. Stomach/Bowel: There is large exophytic infiltrating mass in proximal lateral wall of the stomach. The mass measures at least  10 by 7.4 cm. There is intraluminal debris within mass and the lumen communicates with main gastric lumen. Findings highly suspicious for malignant gastrointestinal stromal tumor. No small bowel obstruction. There is no evidence of free abdominal air. Vascular/Lymphatic: No aortic aneurysm. Atherosclerotic plaques and calcifications are noted abdominal aorta and iliac arteries. There is anterior omental lesion measures at least 6.2 x 4.3 cm. Second omental lesion axial image 46 measures 4.3 by 3.8 cm. Additional smaller omental nodules are noted the largest measures 5 cm. Findings are highly suspicious for omental caking/ metastatic disease. There is a lesion in right cul-de-sac measures 2.5 cm highly suspicious for drop metastasis. Reproductive: The uterus is atrophic.  No adnexal masses noted. Other: The urinary bladder is unremarkable. There is no abdominal ascites. Musculoskeletal: No destructive bony lesions are noted. Disc space flattening with vacuum disc phenomenon noted at L5-S1 level. IMPRESSION: 1. There is large exophytic mass in proximal lateral stomach measures at least 10 x 7.4 cm highly suspicious for malignant gastric tumor. 2. There are nodular lesions within omentum and just medial to the spleen highly suspicious for metastatic disease. 3. Multiple liver nodules are noted consistent with metastatic disease. 4. No hydronephrosis or hydroureter. 5. No small bowel obstruction. These results were called by telephone at the time of interpretation on 04/08/2016 at 12:54 pm to Dr. Zenovia Jarred , who verbally acknowledged these results. Electronically Signed   By: Orlean Bradford.D.  On: 04/08/2016 12:56     IMPRESSION:   *  Symptomatic gastric mass with pain, weight loss, anemia.      PLAN:     *  EGD.  Tentatively set for ~ 3 pm tomorrow, this will be conscious sedation not MAC.  Need to confirm this with Dr Loletha Carrow.  Ok to eat now.  Since iron residue can create false impression of  blood, will hold tomorrow's dose and resume on 10/26.     Azucena Freed  04/08/2016, 1:52 PM Pager: [phone]  I have reviewed the entire case in detail with the above APP and discussed the plan in detail.  Therefore, I agree with the diagnoses recorded above. In addition,  I have personally interviewed and examined the patient and have personally reviewed any abdominal/pelvic CT scan images.  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 No tenderness or palpable mass or hepatomegaly on exam.  EGD tomorrow, she is agreeable.   [name] Pager [phone]  Mon-Fri 8a-5p [phone] after 5p, weekends, holidays

## 2016-04-09 NOTE — Telephone Encounter (Signed)
Advise follow up as appropriate from ER.

## 2016-04-10 ENCOUNTER — Telehealth: Payer: Self-pay | Admitting: *Deleted

## 2016-04-10 DIAGNOSIS — C787 Secondary malignant neoplasm of liver and intrahepatic bile duct: Secondary | ICD-10-CM

## 2016-04-10 DIAGNOSIS — C169 Malignant neoplasm of stomach, unspecified: Secondary | ICD-10-CM

## 2016-04-10 DIAGNOSIS — R109 Unspecified abdominal pain: Secondary | ICD-10-CM

## 2016-04-10 DIAGNOSIS — N179 Acute kidney failure, unspecified: Secondary | ICD-10-CM

## 2016-04-10 DIAGNOSIS — I10 Essential (primary) hypertension: Secondary | ICD-10-CM

## 2016-04-10 DIAGNOSIS — D649 Anemia, unspecified: Secondary | ICD-10-CM

## 2016-04-10 DIAGNOSIS — C786 Secondary malignant neoplasm of retroperitoneum and peritoneum: Secondary | ICD-10-CM

## 2016-04-10 LAB — BASIC METABOLIC PANEL
Anion gap: 8 (ref 5–15)
BUN: 12 mg/dL (ref 6–20)
CO2: 26 mmol/L (ref 22–32)
Calcium: 8.2 mg/dL — ABNORMAL LOW (ref 8.9–10.3)
Chloride: 106 mmol/L (ref 101–111)
Creatinine, Ser: 1.09 mg/dL — ABNORMAL HIGH (ref 0.44–1.00)
GFR calc Af Amer: 54 mL/min — ABNORMAL LOW (ref >60)
GFR, EST NON AFRICAN AMERICAN: 47 mL/min — AB (ref >60)
GLUCOSE: 103 mg/dL — AB (ref 65–99)
POTASSIUM: 3.3 mmol/L — AB (ref 3.5–5.1)
Sodium: 140 mmol/L (ref 135–145)

## 2016-04-10 LAB — CBC
HCT: 28 % — ABNORMAL LOW (ref 36.0–46.0)
Hemoglobin: 8.6 g/dL — ABNORMAL LOW (ref 12.0–15.0)
MCH: 29 pg (ref 26.0–34.0)
MCHC: 30.7 g/dL (ref 30.0–36.0)
MCV: 94.3 fL (ref 78.0–100.0)
PLATELETS: 341 10*3/uL (ref 150–400)
RBC: 2.97 MIL/uL — AB (ref 3.87–5.11)
RDW: 14.2 % (ref 11.5–15.5)
WBC: 7.6 10*3/uL (ref 4.0–10.5)

## 2016-04-10 MED ORDER — ENSURE ENLIVE PO LIQD
237.0000 mL | Freq: Three times a day (TID) | ORAL | Status: DC
  Administered 2016-04-10 (×3): 237 mL via ORAL

## 2016-04-10 MED ORDER — FERROUS SULFATE 325 (65 FE) MG PO TABS
325.0000 mg | ORAL_TABLET | Freq: Two times a day (BID) | ORAL | Status: DC
  Administered 2016-04-10: 325 mg via ORAL
  Filled 2016-04-10 (×2): qty 1

## 2016-04-10 MED ORDER — POTASSIUM CHLORIDE 10 MEQ/100ML IV SOLN
10.0000 meq | INTRAVENOUS | Status: AC
  Administered 2016-04-10 (×3): 10 meq via INTRAVENOUS
  Filled 2016-04-10: qty 100

## 2016-04-10 NOTE — Progress Notes (Signed)
PROGRESS NOTE    Haley Mcmillan  [id number] DOB: 04/24/1934 DOA: 04/08/2016 PCP: Hoyt Koch, MD    Brief Narrative: Haley Mcmillan is a 82 y.o. female with medical history significant for anemia, arthritis, colonic polyps, hyperlipidemia, hypertension presents to the emergency department after recent GI visit with persistent worsening epigastric pain and unintentional weight loss. Initial evaluation reveals a gastric mass and liver nodules concerning for malignancy and metastases.  Information is obtained from the patient and the chart. She reports intermittent epigastric pain for the last month. Associated symptoms include nausea without emesis and decreased oral intake. She reports a 20 pound weight loss as a result. She takes pantoprazole with intermittent relief. Last 2 days pain worse and she called her PCP who recommended she come to the emergency department. She denies diarrhea but does endorse a propensity for constipation and takes Miralax daily. He denies dysuria hematuria frequency or urgency but does endorse occasional difficulty initiating flow. She denies headache dizziness syncope or near-syncope. She denies lower extremity edema or orthopnea   Assessment & Plan:   Principal Problem:   Neoplasm of abdomen Active Problems:   Hyperlipidemia   Essential hypertension   Loss of weight   Liver nodule   AKI (acute kidney injury) ([place])   Abdominal pain   Gastric mass  1. Abdominal mass/likely gastric tumor.  CT reveals large exophytic mass in proximal lateral stomach measures at least 10 x 7.4 cm highly suspicious for malignantgastric tumor. 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   2. Hypertension. Controlled in the emergency department. Home medications include lisinopril,  hydrochlorothiazide -We'll hold home meds for now -Gentle IV fluids  3. Acute kidney injury. Creatinine 1.3. Likely related to above as well as medications. -Hold nephrotoxins -Gentle IV fluids -Monitor urine output improving.   4--hypokalemia; replete times one   5-Anemia; follow hb trend. Resume oral iron.   DVT prophylaxis: scd.  Code Status: full code.  Family Communication: husband at bedside.  Disposition Plan: endoscopy today   Consultants:   [facility]    Procedures:   Endoscopy 10-25   Antimicrobials:  none   Subjective: She denies worsening pain. Has been able to tolerate dit. Still with melena.  She was crying, support provide.   Objective: Vitals:   04/09/16 2219 04/10/16 0552 04/10/16 0557 04/10/16 0900  BP: (!) 98/42 (!) 97/58 104/66 (!) 103/53  Pulse: 64 (!) 54  (!) 58  Resp: 18 17  18   Temp: 98.7 F (37.1 C) 99 F (37.2 C)  98.7 F (37.1 C)  TempSrc: Oral Oral  Oral  SpO2: 100% 98%  100%  Weight:        Intake/Output Summary (Last 24 hours) at 04/10/16 1403 Last data filed at 04/10/16 1208  Gross per 24 hour  Intake          1326.66 ml  Output             1360 ml  Net           -33.34 ml   Filed Weights   04/08/16 1056  Weight: 69.8 kg (153 lb 12.8 oz)    Examination:  General exam: Appears calm and comfortable  Respiratory system: Clear to auscultation. Respiratory effort normal. Cardiovascular system: S1 & S2 heard, RRR. No JVD, murmurs, rubs, gallops or clicks. No pedal  edema. Gastrointestinal system: Abdomen is nondistended, soft and nontender. No organomegaly or masses felt. Normal bowel sounds heard. Central nervous system: Alert and oriented. No focal neurological deficits. Extremities: Symmetric 5 x 5 power. Skin: No rashes, lesions or ulcers Psychiatry: Judgement and insight appear normal. Mood & affect appropriate.     Data Reviewed: I have personally reviewed following labs and imaging studies  CBC:  Recent  Labs Lab 04/08/16 1101 04/09/16 0427 04/10/16 0407  WBC 12.1* 8.2 7.6  HGB 10.0* 8.9* 8.6*  HCT 32.5* 29.3* 28.0*  MCV 95.3 94.8 94.3  PLT 385 351 [id number]   Basic Metabolic Panel:  Recent Labs Lab 04/08/16 1101 04/09/16 0427 04/10/16 0407  NA 141 142 140  K 4.1 3.4* 3.3*  CL 103 104 106  CO2 28 28 26   GLUCOSE 105* 99 103*  BUN 14 12 12   CREATININE 1.39* 1.33* 1.09*  CALCIUM 9.6 8.8* 8.2*   GFR: Estimated Creatinine Clearance: 39.3 mL/min (by C-G formula based on SCr of 1.09 mg/dL (H)). Liver Function Tests:  Recent Labs Lab 04/08/16 1101 04/09/16 0427  AST 25 21  ALT 11* 8*  ALKPHOS 75 64  BILITOT 0.7 0.6  PROT 7.0 6.1*  ALBUMIN 3.1* 2.6*    Recent Labs Lab 04/08/16 1101  LIPASE 28   No results for input(s): AMMONIA in the last 168 hours. Coagulation Profile: No results for input(s): INR, PROTIME in the last 168 hours. Cardiac Enzymes: No results for input(s): CKTOTAL, CKMB, CKMBINDEX, TROPONINI in the last 168 hours. BNP (last 3 results) No results for input(s): PROBNP in the last 8760 hours. HbA1C: No results for input(s): HGBA1C in the last 72 hours. CBG: No results for input(s): GLUCAP in the last 168 hours. Lipid Profile: No results for input(s): CHOL, HDL, LDLCALC, TRIG, CHOLHDL, LDLDIRECT in the last 72 hours. Thyroid Function Tests: No results for input(s): TSH, T4TOTAL, FREET4, T3FREE, THYROIDAB in the last 72 hours. Anemia Panel: No results for input(s): VITAMINB12, FOLATE, FERRITIN, TIBC, IRON, RETICCTPCT in the last 72 hours. Sepsis Labs: No results for input(s): PROCALCITON, LATICACIDVEN in the last 168 hours.  No results found for this or any previous visit (from the past 240 hour(s)).       Radiology Studies: Ct Chest W Contrast  Result Date: 04/09/2016 CLINICAL DATA:  82 year old female admitted with epigastric abdominal pain, weight loss, large gastric mass and findings of metastatic disease in the liver and peritoneal cavity.  Chest staging. EXAM: CT CHEST WITH CONTRAST TECHNIQUE: Multidetector CT imaging of the chest was performed during intravenous contrast administration. CONTRAST:  75 cc ISOVUE-300 IOPAMIDOL (ISOVUE-300) INJECTION 61% COMPARISON:  04/08/2016 CT abdomen/ pelvis. FINDINGS: Cardiovascular: Top-normal heart size. No significant pericardial fluid/thickening. Atherosclerotic nonaneurysmal thoracic aorta. Top-normal caliber main pulmonary artery (3.2 cm diameter). No central pulmonary emboli. Mediastinum/Nodes: No discrete thyroid nodules. Unremarkable esophagus. No pathologically enlarged axillary, mediastinal or hilar lymph nodes. Subcentimeter coarsely calcified granulomatous nodes in the bilateral hilum. Lungs/Pleura: No pneumothorax. No pleural effusion. Peripheral right upper lobe 3 mm solid pulmonary nodule (series 7/ image 50). There is mass-effect on the basilar left lower lobe by a 1.9 x 1.6 cm mass at the superior margin of the spleen (series 3/ image 103), favored to represent a peritoneal metastasis. No acute consolidative airspace disease or additional significant pulmonary nodules. Upper abdomen: Re- demonstrated are numerous (greater than 10) hypodense masses scattered throughout the visualized liver measuring up to the 1.9 x 1.7 cm in the lateral segment left liver lobe (series 3/  image 132). Re- demonstrated and partially visualized is a large irregular exophytic mass arising from the proximal stomach. There are several masses in the left upper quadrant between the spleen and gastric fundus measuring up to the 4.0 x 3.5 cm (series 3/ image 121). Musculoskeletal: No aggressive appearing focal osseous lesions. Mild thoracic spondylosis. IMPRESSION: 1. No definite findings of metastatic disease in the chest. 2. Solitary 3 mm peripheral right upper lobe pulmonary nodule, for which a follow-up chest CT is advised in 3 months. 3. Re- demonstration of large irregular exophytic proximal gastric mass most consistent  with primary gastric carcinoma. 4. Re- demonstration of innumerable liver masses most consistent with liver metastases. 5. Re- demonstration of multiple peritoneal masses in the left upper quadrant most consistent with peritoneal metastases. 6. Aortic atherosclerosis. Electronically Signed   By: Ilona Sorrel M.D.   On: 04/09/2016 09:26        Scheduled Meds: . cholecalciferol  5,000 Units Oral Daily  . docusate sodium  100 mg Oral Daily  . ezetimibe-simvastatin  1 tablet Oral QHS  . feeding supplement (ENSURE ENLIVE)  237 mL Oral TID BM  . pantoprazole  40 mg Oral Daily  . potassium chloride  10 mEq Intravenous Once   Continuous Infusions:     LOS: 2 days    Time spent: 35 minutes.     Elmarie Shiley, MD [facility] Pager [phone]  If 7PM-7AM, please contact night-coverage [email or web address] Password [email or web address] 04/10/2016, 2:03 PM

## 2016-04-10 NOTE — Consult Note (Signed)
[place]  [phone]   HEMATOLOGY ONCOLOGY INPATIENT CONSULTATION   MERELIN HUMAN  DOB: 1934/02/01  MR#: [id number]  CSN#: [id number]    Requesting Physician: Dr. 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 female with medical history significant for anemia, arthritis, colonic polyps, hyperlipidemia, hypertension presents to the emergency department after recent GI visit with worsening abdominal pain. She states the pain is intermittent, started about 2 weeks ago, located in the epigastric area, no significant nausea or change of her bowel habits. Her appetite and energy level has decreased lately, although she is able to take care of herself. When the pain resolves, she remains to be active and do house cleaning etc. he has lost about 30 pounds in the past 10-12 months, and the most weight with loss in the past few months. A CT scan showed a large exophytic mass in proximal lateral stomach, measuring 10 x 7.4 cm, and nodular lesions in the omentum, and a multiple liver lesions suspicious for metastatic disease. She underwent EGD by Dr. Simona Huh yesterday, which showed a large gastric tumor in the fundus, biopsy was done, pathology results still pending.  MEDICAL HISTORY:  Past Medical History:  Diagnosis Date  . Anemia   . Arthritis   . Colon polyps   . Olecranon bursitis    Elbow  . Other and unspecified hyperlipidemia   . Palpitations   . Unspecified essential hypertension     SURGICAL HISTORY: Past Surgical History:  Procedure Laterality Date  . COLONOSCOPY W/ POLYPECTOMY  01/2004  . CRYOTHERAPY     female-cryosurgery stated by pt  . ESOPHAGOGASTRODUODENOSCOPY N/A 04/09/2016   Procedure: ESOPHAGOGASTRODUODENOSCOPY (EGD);  Surgeon: Doran Stabler, MD;  Location: [facility] ENDOSCOPY;   Service: Endoscopy;  Laterality: N/A;  . POLYPECTOMY     from vocal surgery    SOCIAL HISTORY: Social History   Social History  . Marital status: Married    Spouse name: N/A  . Number of children: 1  . Years of education: N/A   Occupational History  . retired Retired   Social History Main Topics  . Smoking status: Former Smoker    Packs/day: 0.50    Years: 50.00    Types: Cigarettes    Quit date: 10/31/1994  . Smokeless tobacco: Never Used     Comment: SMOKED SOME; QUIT 1996;   . Alcohol use 0.0 oz/week     Comment: wine  . Drug use: No  . Sexual activity: Not on file   Other Topics Concern  . 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             FAMILY HISTORY: Family History  Problem Relation Age of Onset  . Gallstones Mother   . Aneurysm Father     brain  . Heart disease Father     CAD  . Breast cancer Sister   . Breast cancer Sister   . Heart attack Brother   . Cancer Brother   . Lung cancer Son   .  Diabetes Neg Hx     ALLERGIES:  has No Known Allergies.  MEDICATIONS:  Current Facility-Administered Medications  Medication Dose Route Frequency Provider Last Rate Last Dose  . acetaminophen (TYLENOL) tablet 650 mg  650 mg Oral Q6H PRN Radene Gunning, NP       Or  . acetaminophen (TYLENOL) suppository 650 mg  650 mg Rectal Q6H PRN Radene Gunning, NP      . cholecalciferol (VITAMIN D) tablet 5,000 Units  5,000 Units Oral Daily Lezlie Octave Black, NP      . docusate sodium (COLACE) capsule 100 mg  100 mg Oral Daily Radene Gunning, NP   100 mg at 04/10/16 4562  . ezetimibe-simvastatin (VYTORIN) 10-20 MG per tablet 1 tablet  1 tablet Oral QHS Radene Gunning, NP   1 tablet at 04/09/16 2223  . morphine 2 MG/ML injection 1 mg  1 mg Intravenous Q3H PRN Radene Gunning, NP      . ondansetron [facility]) tablet 4 mg  4 mg Oral Q6H PRN Radene Gunning, NP       Or  . ondansetron [facility]) injection 4 mg  4 mg Intravenous Q6H PRN Radene Gunning, NP      . pantoprazole (PROTONIX) EC tablet 40 mg  40 mg Oral Daily Radene Gunning, NP   40 mg at 04/10/16 5638  . potassium chloride 10 mEq in 100 mL IVPB  10 mEq Intravenous Once Belkys A Regalado, MD      . potassium chloride 10 mEq in 100 mL IVPB  10 mEq Intravenous Q1 Hr x 3 Belkys A Regalado, MD   10 mEq at 04/10/16 1012  . senna-docusate (Senokot-S) tablet 1 tablet  1 tablet Oral QHS PRN Radene Gunning, NP        REVIEW OF SYSTEMS:   Constitutional: Denies fevers, chills or abnormal night sweats Eyes: Denies blurriness of vision, double vision or watery eyes Ears, nose, mouth, throat, and face: Denies mucositis or sore throat Respiratory: Denies cough, dyspnea or wheezes Cardiovascular: Denies palpitation, chest discomfort or lower extremity swelling Gastrointestinal:  Denies nausea, heartburn or change in bowel habits Skin: Denies abnormal skin rashes Lymphatics: Denies new lymphadenopathy or easy bruising Neurological:Denies numbness, tingling or new weaknesses Behavioral/Psych: Mood is stable, no new changes  All other systems were reviewed with the patient and are negative.  PHYSICAL EXAMINATION: ECOG PERFORMANCE STATUS: 2 - Symptomatic, <50% confined to bed  Vitals:   04/10/16 0557 04/10/16 0900  BP: 104/66 (!) 103/53  Pulse:  (!) 58  Resp:  18  Temp:  98.7 F (37.1 C)   Filed Weights   04/08/16 1056  Weight: 153 lb 12.8 oz (69.8 kg)    GENERAL:alert, no distress and comfortable SKIN: skin color, texture, turgor are normal, no rashes or significant lesions EYES: normal, conjunctiva are pink and non-injected, sclera clear OROPHARYNX:no exudate, no erythema and lips, buccal mucosa, and tongue normal  NECK: supple, thyroid normal size, non-tender, without nodularity LYMPH:  no palpable  lymphadenopathy in the cervical, axillary or inguinal LUNGS: clear to auscultation and percussion with normal breathing effort HEART: regular rate & rhythm and no murmurs and no lower extremity edema ABDOMEN:abdomen soft, non-tender and normal bowel sounds Musculoskeletal:no cyanosis of digits and no clubbing  PSYCH: alert & oriented x 3 with fluent speech NEURO: no focal motor/sensory deficits  LABORATORY DATA:  I have reviewed the data as listed Lab Results  Component Value Date   WBC  7.6 04/10/2016   HGB 8.6 (L) 04/10/2016   HCT 28.0 (L) 04/10/2016   MCV 94.3 04/10/2016   PLT 341 04/10/2016    Recent Labs  03/04/16 1027 04/08/16 1101 04/09/16 0427 04/10/16 0407  NA 141 141 142 140  K 4.8 4.1 3.4* 3.3*  CL 105 103 104 106  CO2 33* _0 GLUCOSE 107* 105* 99 103*  BUN 36* _1 CREATININE 1.16 1.39* 1.33* 1.09*  CALCIUM 9.0 9.6 8.8* 8.2*  GFRNONAA  --  35* 37* 47*  GFRAA  --  40* 43* 54*  PROT 6.9 7.0 6.1*  --   ALBUMIN 3.3* 3.1* 2.6*  --   AST _2 --   ALT 8 11* 8*  --   ALKPHOS 74 75 64  --   BILITOT 0.3 0.7 0.6  --     RADIOGRAPHIC STUDIES: I have personally reviewed the radiological images as listed and agreed with the findings in the report. Ct Chest W Contrast  Result Date: 04/09/2016 CLINICAL DATA:  82 year old female admitted with epigastric abdominal pain, weight loss, large gastric mass and findings of metastatic disease in the liver and peritoneal cavity. Chest staging. EXAM: CT CHEST WITH CONTRAST TECHNIQUE: Multidetector CT imaging of the chest was performed during intravenous contrast administration. CONTRAST:  75 cc ISOVUE-300 IOPAMIDOL (ISOVUE-300) INJECTION 61% COMPARISON:  04/08/2016 CT abdomen/ pelvis. FINDINGS: Cardiovascular: Top-normal heart size. No significant pericardial fluid/thickening. Atherosclerotic nonaneurysmal thoracic aorta. Top-normal caliber main pulmonary artery (3.2 cm diameter). No central pulmonary emboli.  Mediastinum/Nodes: No discrete thyroid nodules. Unremarkable esophagus. No pathologically enlarged axillary, mediastinal or hilar lymph nodes. Subcentimeter coarsely calcified granulomatous nodes in the bilateral hilum. Lungs/Pleura: No pneumothorax. No pleural effusion. Peripheral right upper lobe 3 mm solid pulmonary nodule (series 7/ image 50). There is mass-effect on the basilar left lower lobe by a 1.9 x 1.6 cm mass at the superior margin of the spleen (series 3/ image 103), favored to represent a peritoneal metastasis. No acute consolidative airspace disease or additional significant pulmonary nodules. Upper abdomen: Re- demonstrated are numerous (greater than 10) hypodense masses scattered throughout the visualized liver measuring up to the 1.9 x 1.7 cm in the lateral segment left liver lobe (series 3/ image 132). Re- demonstrated and partially visualized is a large irregular exophytic mass arising from the proximal stomach. There are several masses in the left upper quadrant between the spleen and gastric fundus measuring up to the 4.0 x 3.5 cm (series 3/ image 121). Musculoskeletal: No aggressive appearing focal osseous lesions. Mild thoracic spondylosis. IMPRESSION: 1. No definite findings of metastatic disease in the chest. 2. Solitary 3 mm peripheral right upper lobe pulmonary nodule, for which a follow-up chest CT is advised in 3 months. 3. Re- demonstration of large irregular exophytic proximal gastric mass most consistent with primary gastric carcinoma. 4. Re- demonstration of innumerable liver masses most consistent with liver metastases. 5. Re- demonstration of multiple peritoneal masses in the left upper quadrant most consistent with peritoneal metastases. 6. Aortic atherosclerosis. Electronically Signed   By: Ilona Sorrel M.D.   On: 04/09/2016 09:26   Ct Abdomen Pelvis W Contrast  Result Date: 04/08/2016 CLINICAL DATA:  Epigastric pain, anemia, colon polyps, weight loss EXAM: CT ABDOMEN AND  PELVIS WITH CONTRAST TECHNIQUE: Multidetector CT imaging of the abdomen and pelvis was performed using the standard protocol following bolus administration of intravenous contrast. CONTRAST:  37m ISOVUE-300 IOPAMIDOL (ISOVUE-300) INJECTION 61% COMPARISON:  None. FINDINGS: Lower chest:  Lung bases are unremarkable Hepatobiliary: Multiple nodular densities are noted throughout the liver the largest in left hepatic lobe measures 2 cm findings are consistent with metastatic disease. Pancreas: Enhanced pancreas is unremarkable. Spleen: Enhanced spleen is unremarkable. There is a nodular lesion just medial to the spleen measures 3.9 cm highly suspicious for metastatic disease. Adrenals/Urinary Tract: No adrenal gland mass. Kidneys are symmetrical in size and enhancement. No hydronephrosis or hydroureter. Delayed renal images shows bilateral renal symmetrical excretion. Stomach/Bowel: There is large exophytic infiltrating mass in proximal lateral wall of the stomach. The mass measures at least 10 by 7.4 cm. There is intraluminal debris within mass and the lumen communicates with main gastric lumen. Findings highly suspicious for malignant gastrointestinal stromal tumor. No small bowel obstruction. There is no evidence of free abdominal air. Vascular/Lymphatic: No aortic aneurysm. Atherosclerotic plaques and calcifications are noted abdominal aorta and iliac arteries. There is anterior omental lesion measures at least 6.2 x 4.3 cm. Second omental lesion axial image 46 measures 4.3 by 3.8 cm. Additional smaller omental nodules are noted the largest measures 5 cm. Findings are highly suspicious for omental caking/ metastatic disease. There is a lesion in right cul-de-sac measures 2.5 cm highly suspicious for drop metastasis. Reproductive: The uterus is atrophic.  No adnexal masses noted. Other: The urinary bladder is unremarkable. There is no abdominal ascites. Musculoskeletal: No destructive bony lesions are noted. Disc  space flattening with vacuum disc phenomenon noted at L5-S1 level. IMPRESSION: 1. There is large exophytic mass in proximal lateral stomach measures at least 10 x 7.4 cm highly suspicious for malignant gastric tumor. 2. There are nodular lesions within omentum and just medial to the spleen highly suspicious for metastatic disease. 3. Multiple liver nodules are noted consistent with metastatic disease. 4. No hydronephrosis or hydroureter. 5. No small bowel obstruction. These results were called by telephone at the time of interpretation on 04/08/2016 at 12:54 pm to Dr. Zenovia Jarred , who verbally acknowledged these results. Electronically Signed   By: Lahoma Crocker M.D.   On: 04/08/2016 12:56   EGD  - Normal esophagus. - Likely malignant gastric tumor in the gastric fundus. extensively biopsied. - Normal duodenal bulb.  ASSESSMENT & PLAN: 82 year old African-American female, with past medical history of hypertension, otherwise healthy and active, presented with progressive weight loss, fatigue, anorexia and intermittent epigastric pain.  1. 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 Anemia, rule out iron deficiency  3. HTN 4. Abdominal pain  5. AKI, improving   Recommendations: -I reviewed her CT scan findings, which is highly suspicious for metastatic gastric cancer, especially adenocarcinoma. Certainly other rare malignancy, such as GIST, neuroendocrine tumor or lymphoma are also possible  -I have left a message for pathologist Dr. Lyndon Code to call me tomorrow with preliminary biopsy results -I recommend peritoneal mass biopsy by interventional radiology, please call them in the morning to see if they can do it tomorrow. I'll take the liberty to put pt on NPO after midnight -If the biopsy confirms metastatic cancer, unfortunately this is incurable disease, and the goal of therapy is prolonged her life and preserve her quality of life. She is 82 year old, but does have good  baseline health. She will not be a candidate for intensive chemotherapy, but can consider low intensity oral chemo, inguinal therapy or biological agent. If biopsy shows adenocarcinoma, I'll also check for HER2 and PD-L1, to see if she is a candidate for immunotherapy or anti-HER-2 therapy. -please give pain medication upon discharge  -  I plan to see her back in 1-2 weeks when I have the test results back -Pt and her husband are very interested in home care service if she is eligible  -I will take the liberty to order iron study, she has been on iron , but still has moderate anemia, I would consider IV Feraheme if her iron level remains low.    All questions were answered. The patient knows to call the clinic with any problems, questions or concerns.      Truitt Merle, MD 04/10/2016 6pm

## 2016-04-10 NOTE — Telephone Encounter (Signed)
Dr. Burr Medico will see patient to day between 5-6 pm in hospital. Contacted her emergency contact, Pricilla Riffle and made him aware. He reports he will be there and will have her sister present as well. Also notified nurse on 6N of oncology coming this evening.

## 2016-04-10 NOTE — Care Management Note (Signed)
Case Management Note  Patient Details  Name: LIOR BURBANK MRN: [id number] Date of Birth: 05/10/1934  Subjective/Objective:                    Action/Plan:  Consult for home health needs , await PT consult and MD orders and face to face  Expected Discharge Date:                  Expected Discharge Plan:     In-House Referral:     Discharge planning Services  CM Consult  Post Acute Care Choice:  Home Health Choice offered to:     DME Arranged:    DME Agency:     HH Arranged:    [place] Agency:     Status of Service:  In process, will continue to follow  If discussed at Long Length of Stay Meetings, dates discussed:    Additional Comments:  Marilu Favre, RN 04/10/2016, 11:02 AM

## 2016-04-11 ENCOUNTER — Other Ambulatory Visit: Payer: Self-pay | Admitting: Radiology

## 2016-04-11 ENCOUNTER — Other Ambulatory Visit: Payer: Self-pay | Admitting: Hematology

## 2016-04-11 DIAGNOSIS — K3189 Other diseases of stomach and duodenum: Secondary | ICD-10-CM

## 2016-04-11 DIAGNOSIS — D4989 Neoplasm of unspecified behavior of other specified sites: Secondary | ICD-10-CM

## 2016-04-11 DIAGNOSIS — C49A0 Gastrointestinal stromal tumor, unspecified site: Secondary | ICD-10-CM

## 2016-04-11 LAB — CBC
HEMATOCRIT: 29.6 % — AB (ref 36.0–46.0)
Hemoglobin: 9 g/dL — ABNORMAL LOW (ref 12.0–15.0)
MCH: 28.5 pg (ref 26.0–34.0)
MCHC: 30.4 g/dL (ref 30.0–36.0)
MCV: 93.7 fL (ref 78.0–100.0)
Platelets: 332 10*3/uL (ref 150–400)
RBC: 3.16 MIL/uL — ABNORMAL LOW (ref 3.87–5.11)
RDW: 14.2 % (ref 11.5–15.5)
WBC: 6.7 10*3/uL (ref 4.0–10.5)

## 2016-04-11 LAB — BASIC METABOLIC PANEL
ANION GAP: 6 (ref 5–15)
BUN: 10 mg/dL (ref 6–20)
CALCIUM: 8.6 mg/dL — AB (ref 8.9–10.3)
CO2: 26 mmol/L (ref 22–32)
Chloride: 108 mmol/L (ref 101–111)
Creatinine, Ser: 0.98 mg/dL (ref 0.44–1.00)
GFR calc Af Amer: >60 mL/min (ref >60)
GFR calc non Af Amer: 53 mL/min — ABNORMAL LOW (ref >60)
GLUCOSE: 107 mg/dL — AB (ref 65–99)
Potassium: 3.9 mmol/L (ref 3.5–5.1)
Sodium: 140 mmol/L (ref 135–145)

## 2016-04-11 LAB — APTT: APTT: 31 s (ref 24–36)

## 2016-04-11 LAB — FERRITIN: Ferritin: 38 ng/mL (ref 11–307)

## 2016-04-11 LAB — PROTIME-INR
INR: 1.05
PROTHROMBIN TIME: 13.7 s (ref 11.4–15.2)

## 2016-04-11 LAB — IRON AND TIBC
Iron: 21 ug/dL — ABNORMAL LOW (ref 28–170)
Saturation Ratios: 9 % — ABNORMAL LOW (ref 10.4–31.8)
TIBC: 224 ug/dL — AB (ref 250–450)
UIBC: 203 ug/dL

## 2016-04-11 MED ORDER — TRAMADOL HCL 50 MG PO TABS: 50.0000 mg | ORAL_TABLET | Freq: Four times a day (QID) | ORAL | 0 refills | Status: DC | PRN

## 2016-04-11 MED ORDER — SODIUM CHLORIDE 0.9 % IV SOLN
510.0000 mg | Freq: Once | INTRAVENOUS | Status: AC
  Administered 2016-04-11: 510 mg via INTRAVENOUS
  Filled 2016-04-11 (×2): qty 17

## 2016-04-11 MED ORDER — DOCUSATE SODIUM 100 MG PO CAPS: 100.0000 mg | ORAL_CAPSULE | Freq: Every day | ORAL | 0 refills | Status: DC

## 2016-04-11 MED ORDER — SENNOSIDES-DOCUSATE SODIUM 8.6-50 MG PO TABS: 1.0000 | ORAL_TABLET | Freq: Every evening | ORAL | 0 refills | Status: DC | PRN

## 2016-04-11 MED ORDER — ENSURE ENLIVE PO LIQD: 237.0000 mL | Freq: Three times a day (TID) | ORAL | 12 refills | Status: DC

## 2016-04-11 MED ORDER — ACETAMINOPHEN-CODEINE 300-30 MG PO TABS: 1.0000 | ORAL_TABLET | Freq: Three times a day (TID) | ORAL | 0 refills | Status: DC | PRN

## 2016-04-11 MED ORDER — FERROUS SULFATE 325 (65 FE) MG PO TABS: 325.0000 mg | ORAL_TABLET | Freq: Two times a day (BID) | ORAL | 3 refills | Status: DC

## 2016-04-11 MED ORDER — ONDANSETRON HCL 4 MG PO TABS: 4.0000 mg | ORAL_TABLET | Freq: Four times a day (QID) | ORAL | 0 refills | Status: DC | PRN

## 2016-04-11 NOTE — Discharge Instructions (Signed)
CT biopsy is scheduled for Monday 10/30 at 0800 at [facility]. Pt needs to arrive at 0630 to admitting/Short Stay. Nothing to drink after midnight Sunday. These instructions were discussed with the patient and her family prior to discharge

## 2016-04-11 NOTE — Progress Notes (Signed)
Haley Mcmillan   DOB:01-01-1934   BH#:[id number]   OXB#:[id number]  Oncology follow-up note  Subjective: Patient's peritoneal mass biopsy is scheduled for coming Monday by IR. He is ready to be discharged. I stop by and discussed her gastric biopsy results with patient and her family members. She feels well overall. No pain.   Objective:  Vitals:   04/11/16 0538 04/11/16 1333  BP: 124/62 132/70  Pulse: 61 66  Resp: 19 18  Temp: 98.2 F (36.8 C) 98.6 F (37 C)    Body mass index is 24.45 kg/m.  Intake/Output Summary (Last 24 hours) at 04/11/16 1824 Last data filed at 04/11/16 1333  Gross per 24 hour  Intake                0 ml  Output             1400 ml  Net            -1400 ml     Sclerae unicteric  Oropharynx clear  No peripheral adenopathy  Lungs clear -- no rales or rhonchi  Heart regular rate and rhythm  Abdomen benign  MSK no focal spinal tenderness, no peripheral edema  Neuro nonfocal   CBG (last 3)  No results for input(s): GLUCAP in the last 72 hours.   Labs:  Lab Results  Component Value Date   WBC 6.7 04/11/2016   HGB 9.0 (L) 04/11/2016   HCT 29.6 (L) 04/11/2016   MCV 93.7 04/11/2016   PLT 332 04/11/2016   NEUTROABS 3.1 05/31/2010    CMP Latest Ref Rng & Units 04/11/2016 04/10/2016 04/09/2016  Glucose 65 - 99 mg/dL 107(H) 103(H) 99  BUN 6 - 20 mg/dL '10 12 12  '$ Creatinine 0.44 - 1.00 mg/dL 0.98 1.09(H) 1.33(H)  Sodium 135 - 145 mmol/L 140 140 142  Potassium 3.5 - 5.1 mmol/L 3.9 3.3(L) 3.4(L)  Chloride 101 - 111 mmol/L 108 106 104  CO2 22 - 32 mmol/L '26 26 28  '$ Calcium 8.9 - 10.3 mg/dL 8.6(L) 8.2(L) 8.8(L)  Total Protein 6.5 - 8.1 g/dL - - 6.1(L)  Total Bilirubin 0.3 - 1.2 mg/dL - - 0.6  Alkaline Phos 38 - 126 U/L - - 64  AST 15 - 41 U/L - - 21  ALT 14 - 54 U/L - - 8(L)     Urine Studies No results for input(s): UHGB, CRYS in the last 72 hours.  Invalid input(s): UACOL, UAPR, USPG, UPH, UTP, UGL, UKET, UBIL, UNIT, UROB, ULEU, UEPI, UWBC,  Duwayne Heck [place], [place]  Basic Metabolic Panel:  Recent Labs Lab 04/08/16 1101 04/09/16 0427 04/10/16 0407 04/11/16 0608  NA 141 142 140 140  K 4.1 3.4* 3.3* 3.9  CL 103 104 106 108  CO2 '28 28 26 26  '$ GLUCOSE 105* 99 103* 107*  BUN '14 12 12 10  '$ CREATININE 1.39* 1.33* 1.09* 0.98  CALCIUM 9.6 8.8* 8.2* 8.6*   GFR Estimated Creatinine Clearance: 43.7 mL/min (by C-G formula based on SCr of 0.98 mg/dL). Liver Function Tests:  Recent Labs Lab 04/08/16 1101 04/09/16 0427  AST 25 21  ALT 11* 8*  ALKPHOS 75 64  BILITOT 0.7 0.6  PROT 7.0 6.1*  ALBUMIN 3.1* 2.6*    Recent Labs Lab 04/08/16 1101  LIPASE 28   No results for input(s): AMMONIA in the last 168 hours. Coagulation profile  Recent Labs Lab 04/11/16 1043  INR 1.05    CBC:  Recent Labs Lab 04/08/16 1101 04/09/16  7121 04/10/16 0407 04/11/16 0608  WBC 12.1* 8.2 7.6 6.7  HGB 10.0* 8.9* 8.6* 9.0*  HCT 32.5* 29.3* 28.0* 29.6*  MCV 95.3 94.8 94.3 93.7  PLT 385 351 341 332   Cardiac Enzymes: No results for input(s): CKTOTAL, CKMB, CKMBINDEX, TROPONINI in the last 168 hours. BNP: Invalid input(s): POCBNP CBG: No results for input(s): GLUCAP in the last 168 hours. D-Dimer No results for input(s): DDIMER in the last 72 hours. Hgb A1c No results for input(s): HGBA1C in the last 72 hours. Lipid Profile No results for input(s): CHOL, HDL, LDLCALC, TRIG, CHOLHDL, LDLDIRECT in the last 72 hours. Thyroid function studies No results for input(s): TSH, T4TOTAL, T3FREE, THYROIDAB in the last 72 hours.  Invalid input(s): FREET3 Anemia work up  Recent Labs  04/11/16 0608  FERRITIN 38  TIBC 224*  IRON 21*   Microbiology No results found for this or any previous visit (from the past 240 hour(s)).  PATHOLOGY REPORT  Diagnosis 04/09/2016 Stomach, biopsy - GASTROINTESTINAL STROMAL TUMOR (GIST) - SEE COMMENT. Microscopic Comment The biopsy fragments reveal a hypercellular spindle cell  proliferation with cytologic atypia, a few mitoses and necrosis. 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 Immunohistochemical stains were performed revealing that the tumor cells are strongly positive for CD117 and CD34. They are negative for cytokeratin A18, cytokeratin AE1/AE3, desmin, smooth muscle actin, smooth muscle myosin and S100. Dr. Claudette Laws has reviewed the case and concurs with this interpretation. Dr. Annamaria Boots was paged on 04/11/16.  Studies:  No results found.  Assessment: 82 y.o. African-American female, with past medical history of hypertension, otherwise healthy and active, presented with progressive weight loss, fatigue, anorexia and intermittent epigastric pain.  1. 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 Anemia, iron deficiency  3. HTN 4. Abdominal pain  5. 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 Although it's still incurable due to the metastatic disease, but overall this is more treatable disease, patient usually respond well to [place], no need chemo, and overall prognosis is better than gastric adenocarcinoma. -I have requested kit gene mutation test, which will predict her response to [place]. -She is scheduled to have peritoneal mass pops up by IR next week, to confirm the metastatic disease. -I will call her [place] prescription, it may take 1-2 weeks to get the medication to be delivered -I plan to see her back in my office in 1-2 weeks. -She was prescribed with tramadol as needed for pain upon discharge. -The patient knows to call me if she has any questions before her next appointment.   Truitt Merle, MD 04/11/2016  6:24 PM

## 2016-04-11 NOTE — Progress Notes (Signed)
Oncology brief note  I have contacted interventional radiology and come in and request peritoneum mass biopsy today, if possible. Patient is NPO after midnight. IR PA will see and evaluate the patient and make decision afterwards. Dr. Tyrell Antonio is agreeable and has ordered the biopsy.   Truitt Merle

## 2016-04-11 NOTE — Discharge Summary (Signed)
Physician Discharge Summary  [name] [id number] DOB: 1934-01-14 DOA: 04/08/2016  PCP: Hoyt Koch, MD  Admit date: 04/08/2016 Discharge date: 04/11/2016  Admitted From: Home  Disposition:  Home   Recommendations for Outpatient Follow-up:  1. Follow up with PCP in 1-2 weeks 2. Please obtain BMP/CBC in one week 3. Follow up with Dr Burr Medico for further care of malignancy 4. Biopsy to peritoneal mass on Monday , follow with IR.   Home Health: yes    Discharge Condition: stable.  CODE STATUS: full code.  Diet recommendation: soft diet  Brief/Interim Summary: Haley Mcmillan a 82 y.o.femalewith medical history significantfor anemia, arthritis, colonic polyps, hyperlipidemia, hypertension presents to the emergency department after recent GI visit with persistent worsening epigastric pain and unintentional weight loss. Initial evaluation reveals a gastric mass and liver nodules concerning for malignancy and metastases.  Information is obtained from the patient and the chart. She reports intermittent epigastric pain for the last month. Associated symptoms include nausea without emesis and decreased oral intake. She reports a 20 pound weight loss as a result. She takes pantoprazole with intermittent relief. Last 2 days pain worse and she called her PCP who recommended she come to the emergency department. She denies diarrhea but does endorse a propensity for constipation and takes Miralax daily. He denies dysuria hematuria frequency or urgency but does endorse occasional difficulty initiating flow. She denies headache dizziness syncope or near-syncope. She denies lower extremity edema or orthopnea   Assessment & Plan:   Principal Problem:   Neoplasm of abdomen Active Problems:   Hyperlipidemia   Essential hypertension   Loss of weight   Liver nodule   AKI (acute kidney injury) ([place])   Abdominal pain   Gastric mass  1. Abdominal mass/ likely gastric tumor.   CT revealslarge exophytic mass in proximal lateral stomach measures at least 10 x 7.4 cm highly suspicious for malignantgastric tumor. 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  -biopsy pending. Dr Burr Medico will speak with patient this afternoon prior to discharge.  -I explain to family options for treatment and the need for second biopsy  2. Hypertension. Controlled in the emergency department. Home medications include lisinopril, hydrochlorothiazide -We'll hold home meds at discharge    3. Acute kidney injury. Creatinine 1.3. Likely related to above as well as medications. -Hold nephrotoxins -Gentle IV fluids -Monitor urine output improving.   4--hypokalemia; replete times one   5-Anemia; follow hb trend. Resume oral iron. Will give one time dose of IV iron.    Discharge Diagnoses:  Principal Problem:   Neoplasm of abdomen Active Problems:   Hyperlipidemia   Essential hypertension   Loss of weight   Liver nodule   AKI (acute kidney injury) ([place])   Abdominal pain   Gastric mass    Discharge Instructions  Discharge Instructions    Diet - low sodium heart healthy    Complete by:  As directed    Increase activity slowly    Complete by:  As directed        Medication List    STOP taking these medications   lisinopril-hydrochlorothiazide 10-12.5 MG tablet Commonly known as:  PRINZIDE,ZESTORETIC   polyethylene glycol packet Commonly known as:  MIRALAX / GLYCOLAX   VYTORIN 10-20 MG tablet Generic drug:  ezetimibe-simvastatin     TAKE these medications  docusate sodium 100 MG capsule Commonly known as:  COLACE Take 1 capsule (100 mg total) by mouth daily.   feeding supplement (ENSURE ENLIVE) Liqd Take 237 mLs by mouth 3 (three) times daily between meals.   ferrous sulfate 325 (65 FE)  MG tablet Take 1 tablet (325 mg total) by mouth 2 (two) times daily with a meal. What changed:  when to take this   ondansetron 4 MG tablet Commonly known as:  ZOFRAN Take 1 tablet (4 mg total) by mouth every 6 (six) hours as needed for nausea.   pantoprazole 40 MG tablet Commonly known as:  PROTONIX Take 1 tablet (40 mg total) by mouth daily.   senna-docusate 8.6-50 MG tablet Commonly known as:  Senokot-S Take 1 tablet by mouth at bedtime as needed for mild constipation.   traMADol 50 MG tablet Commonly known as:  ULTRAM Take 1 tablet (50 mg total) by mouth every 6 (six) hours as needed.   Vitamin D3 5000 units Caps Take 5,000 Units by mouth daily.       No Known Allergies  Consultations:  Dr Burr Medico  IR  GI    Procedures/Studies: Ct Chest W Contrast  Result Date: 04/09/2016 CLINICAL DATA:  82 year old female admitted with epigastric abdominal pain, weight loss, large gastric mass and findings of metastatic disease in the liver and peritoneal cavity. Chest staging. EXAM: CT CHEST WITH CONTRAST TECHNIQUE: Multidetector CT imaging of the chest was performed during intravenous contrast administration. CONTRAST:  75 cc ISOVUE-300 IOPAMIDOL (ISOVUE-300) INJECTION 61% COMPARISON:  04/08/2016 CT abdomen/ pelvis. FINDINGS: Cardiovascular: Top-normal heart size. No significant pericardial fluid/thickening. Atherosclerotic nonaneurysmal thoracic aorta. Top-normal caliber main pulmonary artery (3.2 cm diameter). No central pulmonary emboli. Mediastinum/Nodes: No discrete thyroid nodules. Unremarkable esophagus. No pathologically enlarged axillary, mediastinal or hilar lymph nodes. Subcentimeter coarsely calcified granulomatous nodes in the bilateral hilum. Lungs/Pleura: No pneumothorax. No pleural effusion. Peripheral right upper lobe 3 mm solid pulmonary nodule (series 7/ image 50). There is mass-effect on the basilar left lower lobe by a 1.9 x 1.6 cm mass at the superior margin of the  spleen (series 3/ image 103), favored to represent a peritoneal metastasis. No acute consolidative airspace disease or additional significant pulmonary nodules. Upper abdomen: Re- demonstrated are numerous (greater than 10) hypodense masses scattered throughout the visualized liver measuring up to the 1.9 x 1.7 cm in the lateral segment left liver lobe (series 3/ image 132). Re- demonstrated and partially visualized is a large irregular exophytic mass arising from the proximal stomach. There are several masses in the left upper quadrant between the spleen and gastric fundus measuring up to the 4.0 x 3.5 cm (series 3/ image 121). Musculoskeletal: No aggressive appearing focal osseous lesions. Mild thoracic spondylosis. IMPRESSION: 1. No definite findings of metastatic disease in the chest. 2. Solitary 3 mm peripheral right upper lobe pulmonary nodule, for which a follow-up chest CT is advised in 3 months. 3. Re- demonstration of large irregular exophytic proximal gastric mass most consistent with primary gastric carcinoma. 4. Re- demonstration of innumerable liver masses most consistent with liver metastases. 5. Re- demonstration of multiple peritoneal masses in the left upper quadrant most consistent with peritoneal metastases. 6. Aortic atherosclerosis. Electronically Signed   By: Ilona Sorrel M.D.   On: 04/09/2016 09:26   Ct Abdomen Pelvis W Contrast  Result Date: 04/08/2016 CLINICAL DATA:  Epigastric pain, anemia, colon polyps, weight loss EXAM: CT ABDOMEN AND PELVIS WITH CONTRAST TECHNIQUE: Multidetector CT imaging of the abdomen and pelvis  was performed using the standard protocol following bolus administration of intravenous contrast. CONTRAST:  28mL ISOVUE-300 IOPAMIDOL (ISOVUE-300) INJECTION 61% COMPARISON:  None. FINDINGS: Lower chest: Lung bases are unremarkable Hepatobiliary: Multiple nodular densities are noted throughout the liver the largest in left hepatic lobe measures 2 cm findings are  consistent with metastatic disease. Pancreas: Enhanced pancreas is unremarkable. Spleen: Enhanced spleen is unremarkable. There is a nodular lesion just medial to the spleen measures 3.9 cm highly suspicious for metastatic disease. Adrenals/Urinary Tract: No adrenal gland mass. Kidneys are symmetrical in size and enhancement. No hydronephrosis or hydroureter. Delayed renal images shows bilateral renal symmetrical excretion. Stomach/Bowel: There is large exophytic infiltrating mass in proximal lateral wall of the stomach. The mass measures at least 10 by 7.4 cm. There is intraluminal debris within mass and the lumen communicates with main gastric lumen. Findings highly suspicious for malignant gastrointestinal stromal tumor. No small bowel obstruction. There is no evidence of free abdominal air. Vascular/Lymphatic: No aortic aneurysm. Atherosclerotic plaques and calcifications are noted abdominal aorta and iliac arteries. There is anterior omental lesion measures at least 6.2 x 4.3 cm. Second omental lesion axial image 46 measures 4.3 by 3.8 cm. Additional smaller omental nodules are noted the largest measures 5 cm. Findings are highly suspicious for omental caking/ metastatic disease. There is a lesion in right cul-de-sac measures 2.5 cm highly suspicious for drop metastasis. Reproductive: The uterus is atrophic.  No adnexal masses noted. Other: The urinary bladder is unremarkable. There is no abdominal ascites. Musculoskeletal: No destructive bony lesions are noted. Disc space flattening with vacuum disc phenomenon noted at L5-S1 level. IMPRESSION: 1. There is large exophytic mass in proximal lateral stomach measures at least 10 x 7.4 cm highly suspicious for malignant gastric tumor. 2. There are nodular lesions within omentum and just medial to the spleen highly suspicious for metastatic disease. 3. Multiple liver nodules are noted consistent with metastatic disease. 4. No hydronephrosis or hydroureter. 5. No  small bowel obstruction. These results were called by telephone at the time of interpretation on 04/08/2016 at 12:54 pm to Dr. Zenovia Jarred , who verbally acknowledged these results. Electronically Signed   By: Lahoma Crocker M.D.   On: 04/08/2016 12:56       Subjective: Feeling ok, no significant pain   Discharge Exam: Vitals:   04/11/16 0538 04/11/16 1333  BP: 124/62 132/70  Pulse: 61 66  Resp: 19 18  Temp: 98.2 F (36.8 C) 98.6 F (37 C)   Vitals:   04/10/16 1424 04/10/16 2210 04/11/16 0538 04/11/16 1333  BP: (!) 128/54 128/64 124/62 132/70  Pulse: 67 69 61 66  Resp: 18 20 19 18   Temp: 98.5 F (36.9 C) 98.4 F (36.9 C) 98.2 F (36.8 C) 98.6 F (37 C)  TempSrc: Oral Oral Oral Oral  SpO2: 100% 100% 99% 99%  Weight:        General: Pt is alert, awake, not in acute distress Cardiovascular: RRR, S1/S2 +, no rubs, no gallops Respiratory: CTA bilaterally, no wheezing, no rhonchi Abdominal: Soft, NT, ND, bowel sounds + Extremities: no edema, no cyanosis    The results of significant diagnostics from this hospitalization (including imaging, microbiology, ancillary and laboratory) are listed below for reference.     Microbiology: No results found for this or any previous visit (from the past 240 hour(s)).   Labs: BNP (last 3 results) No results for input(s): BNP in the last 8760 hours. Basic Metabolic Panel:  Recent Labs Lab 04/08/16 1101 04/09/16  [id number] 04/10/16 0407 04/11/16 0608  NA 141 142 140 140  K 4.1 3.4* 3.3* 3.9  CL 103 104 106 108  CO2 28 28 26 26   GLUCOSE 105* 99 103* 107*  BUN 14 12 12 10   CREATININE 1.39* 1.33* 1.09* 0.98  CALCIUM 9.6 8.8* 8.2* 8.6*   Liver Function Tests:  Recent Labs Lab 04/08/16 1101 04/09/16 0427  AST 25 21  ALT 11* 8*  ALKPHOS 75 64  BILITOT 0.7 0.6  PROT 7.0 6.1*  ALBUMIN 3.1* 2.6*    Recent Labs Lab 04/08/16 1101  LIPASE 28   No results for input(s): AMMONIA in the last 168 hours. CBC:  Recent  Labs Lab 04/08/16 1101 04/09/16 0427 04/10/16 0407 04/11/16 0608  WBC 12.1* 8.2 7.6 6.7  HGB 10.0* 8.9* 8.6* 9.0*  HCT 32.5* 29.3* 28.0* 29.6*  MCV 95.3 94.8 94.3 93.7  PLT 385 351 341 332   Cardiac Enzymes: No results for input(s): CKTOTAL, CKMB, CKMBINDEX, TROPONINI in the last 168 hours. BNP: Invalid input(s): POCBNP CBG: No results for input(s): GLUCAP in the last 168 hours. D-Dimer No results for input(s): DDIMER in the last 72 hours. Hgb A1c No results for input(s): HGBA1C in the last 72 hours. Lipid Profile No results for input(s): CHOL, HDL, LDLCALC, TRIG, CHOLHDL, LDLDIRECT in the last 72 hours. Thyroid function studies No results for input(s): TSH, T4TOTAL, T3FREE, THYROIDAB in the last 72 hours.  Invalid input(s): FREET3 Anemia work up  Recent Labs  04/11/16 0608  FERRITIN 38  TIBC 224*  IRON 21*   Urinalysis    Component Value Date/Time   COLORURINE YELLOW 04/08/2016 1752   APPEARANCEUR CLOUDY (A) 04/08/2016 1752   LABSPEC 1.010 04/08/2016 1752   PHURINE 7.5 04/08/2016 1752   GLUCOSEU NEGATIVE 04/08/2016 1752   HGBUR SMALL (A) 04/08/2016 1752   BILIRUBINUR NEGATIVE 04/08/2016 1752   KETONESUR NEGATIVE 04/08/2016 1752   PROTEINUR NEGATIVE 04/08/2016 1752   NITRITE NEGATIVE 04/08/2016 1752   LEUKOCYTESUR LARGE (A) 04/08/2016 1752   Sepsis Labs Invalid input(s): PROCALCITONIN,  WBC,  LACTICIDVEN Microbiology No results found for this or any previous visit (from the past 240 hour(s)).   Time coordinating discharge: Over 30 minutes  SIGNED:   Elmarie Shiley, MD  [facility] 04/11/2016, 3:14 PM Pager [phone]  If 7PM-7AM, please contact night-coverage [email or web address] Password [email or web address]

## 2016-04-11 NOTE — Care Management Important Message (Signed)
Important Message  Patient Details  Name: Haley Mcmillan MRN: [id number] Date of Birth: 1934-01-04   Medicare Important Message Given:       Orbie Pyo 04/11/2016, 3:42 PM

## 2016-04-11 NOTE — Progress Notes (Signed)
Pt receiving Ferahame. AFter starting infusion pt stable, no sign of allergy reaction. Pt denies any symptoms 30 min after finishing Iron. Pt discharge. Discharge paper reviewed with pt, she understands. Prescription given to pt.

## 2016-04-11 NOTE — Care Management Note (Signed)
Case Management Note  Patient Details  Name: KADEESHA REIMER MRN: [id number] Date of Birth: March 05, 1934  Subjective/Objective:                    Action/Plan: Spoke with patient and her granddaughter at bedside. Confirmed face sheet information. Explained MD ordered RN,PT,SW and aide.  Choice offered and patient would like [facility]. Patient stated she would need someone to cook and clean . Explained to patient and granddaughter home health does not provide cooking and cleaning services. Provided a list of private duty agencies , which they would have to arrange and pay for privately. Both voiced understanding .  Referral made to [facility] with [place] , she will come visit the patient and answer more detailed questions regarding home health services. Patient and granddaughter aware.  Expected Discharge Date:                  Expected Discharge Plan:  [place]  In-House Referral:     Discharge planning Services  CM Consult  Post Acute Care Choice:  Home Health Choice offered to:  Patient  DME Arranged:    DME Agency:     HH Arranged:  RN, PT, Social Work, Nurse's Aide [place] Agency:  [place]  Status of Service:  Completed, signed off  If discussed at [employer], dates discussed:    Additional Comments:  Marilu Favre, RN 04/11/2016, 2:15 PM

## 2016-04-11 NOTE — Evaluation (Signed)
Physical Therapy Evaluation & discharge Patient Details Name: Haley Mcmillan MRN: [id number] DOB: [birth year] Today's Date: 04/11/2016   History of Present Illness  Haley Mcmillan is a 82 y.o. female with medical history significant for anemia, arthritis, colonic polyps, hyperlipidemia, hypertension presents to the emergency department after recent GI visit with persistent worsening epigastric pain and unintentional weight loss. Initial evaluation reveals a gastric mass and liver nodules concerning for malignancy and metastases. PMH includes HTN and acute kidney injury  Clinical Impression  Pt able to ambulate around unit as well as manage a flight of stairs at S to MOD I level.  She was instructed in seated LE therex as well.  No further skilled PT needs identified and will sign off.  Please re-order if pt's status changes.    Follow Up Recommendations No PT follow up    Equipment Recommendations  None recommended by PT    Recommendations for Other Services       Precautions / Restrictions Precautions Precautions: None Restrictions Weight Bearing Restrictions: No      Mobility  Bed Mobility Overal bed mobility: Independent             General bed mobility comments: bed flat without rails  Transfers Overall transfer level: Independent                  Ambulation/Gait Ambulation/Gait assistance: Supervision;Modified independent (Device/Increase time) Ambulation Distance (Feet): 510 Feet Assistive device: None Gait Pattern/deviations: Step-through pattern     General Gait Details: Pt with no balance deficits noted with head turns or head nods.  Stairs Stairs: Yes Stairs assistance: Supervision Stair Management: No rails;One rail Right;One rail Left;Alternating pattern Number of Stairs: 12 General stair comments: Pt did 3 steps without rail to simulate home entry stairs then ambulated rest of stairs with 1 rail.  Pt able to do step through alternating  pattern with all steps.  Wheelchair Mobility    Modified Rankin (Stroke Patients Only)       Balance Overall balance assessment: No apparent balance deficits (not formally assessed)                                           Pertinent Vitals/Pain Pain Assessment: No/denies pain    Home Living Family/patient expects to be discharged to:: Private residence Living Arrangements: Spouse/significant other Available Help at Discharge: Family;Available PRN/intermittently (husband is there 24 hrs, but he cannot give more than S Assistance) Type of Home: House Home Access: Stairs to enter Entrance Stairs-Rails: None Entrance Stairs-Number of Steps: 2 Home Layout: Two level Home Equipment: Cane - single point Additional Comments: husband has w/c, scooter, RW, but uses them.    Prior Function Level of Independence: Independent               Hand Dominance        Extremity/Trunk Assessment   Upper Extremity Assessment: Overall WFL for tasks assessed           Lower Extremity Assessment: Overall WFL for tasks assessed      Cervical / Trunk Assessment: Normal  Communication      Cognition Arousal/Alertness: Awake/alert Behavior During Therapy: WFL for tasks assessed/performed;Flat affect Overall Cognitive Status: Within Functional Limits for tasks assessed                      General Comments  Exercises General Exercises - Lower Extremity Ankle Circles/Pumps: AROM;Both;Seated Long Arc Quad: Strengthening;Both;10 reps;Seated Hip ABduction/ADduction: Strengthening;Both;10 reps;Seated Hip Flexion/Marching: Strengthening;Both;10 reps;Seated   Assessment/Plan    PT Assessment Patent does not need any further PT services  PT Problem List            PT Treatment Interventions      PT Goals (Current goals can be found in the Care Plan section)  Acute Rehab PT Goals Patient Stated Goal: none stated PT Goal Formulation: All  assessment and education complete, DC therapy    Frequency     Barriers to discharge        Co-evaluation               End of Session Equipment Utilized During Treatment: Gait belt Activity Tolerance: Patient tolerated treatment well Patient left: in chair Nurse Communication: Mobility status         Time: [id number] PT Time Calculation (min) (ACUTE ONLY): 23 min   Charges:   PT Evaluation $PT Eval Low Complexity: 1 Procedure PT Treatments $Gait Training: 8-22 mins   PT G Codes:        Ajanay Farve LUBECK 04/11/2016, 9:40 AM

## 2016-04-11 NOTE — Plan of Care (Signed)
Problem: Food- and Nutrition-Related Knowledge Deficit (NB-1.1) Goal: Nutrition education Formal process to instruct or train a patient/client in a skill or to impart knowledge to help patients/clients voluntarily manage or modify food choices and eating behavior to maintain or improve health. Outcome: Adequate for Discharge  RD consulted for education regarding High-Calorie, High-Protein nutrition therapy.  RD provided "High-Calorie High-Protein Nutrition Therapy" handout from the Academy of [employer] Dietetics. Reviewed patient's dietary recall. Provided examples on ways to increase caloric density of foods and beverages frequently consumed by the patient. Also provided ideas to promote variety and to incorporate additional nutrient dense foods into patient's diet. Discussed eating small frequent meals and snacks to assist in increasing overall po intake. Teach back method used.  Expect fair to good compliance.  Body mass index is 24.45 kg/m. Pt meets criteria for normal weight range based on current BMI.  Current diet order is NPO, patient is consuming approximately n/a% of meals at this time. Labs and medications reviewed. No further nutrition interventions warranted at this time. RD contact information provided. If additional nutrition issues arise, please re-consult RD.   Kalyn Dimattia A. Jimmye Norman, RD, LDN, CDE Pager: [phone] After hours Pager: [phone]

## 2016-04-11 NOTE — H&P (Signed)
Chief Complaint: Patient was seen in consultation today for biopsy of peritoneal mass at the request of Dr. Truitt Merle  Referring Physician(s): Dr. Truitt Merle  Supervising Physician: Arne Cleveland  Patient Status: [facility] - In-pt  History of Present Illness: Haley Mcmillan is a 82 y.o. female admitted to hospital and found to have a large gastric mass with suspicious lesions in the liver and peritoneum concerning for mets. She underwent endoscopy with biopsy of the gastric mass. IR is asked to biopsy the peritoneal lesion for staging. PMHx, meds, labs, imaging reviewed. Family at bedside.   Past Medical History:  Diagnosis Date  . Anemia   . Arthritis   . Colon polyps   . Olecranon bursitis    Elbow  . Other and unspecified hyperlipidemia   . Palpitations   . Unspecified essential hypertension     Past Surgical History:  Procedure Laterality Date  . COLONOSCOPY W/ POLYPECTOMY  01/2004  . CRYOTHERAPY     female-cryosurgery stated by pt  . ESOPHAGOGASTRODUODENOSCOPY N/A 04/09/2016   Procedure: ESOPHAGOGASTRODUODENOSCOPY (EGD);  Surgeon: Doran Stabler, MD;  Location: [facility] ENDOSCOPY;  Service: Endoscopy;  Laterality: N/A;  . POLYPECTOMY     from vocal surgery    Allergies: Review of patient's allergies indicates no known allergies.  Medications: Prior to Admission medications   Medication Sig Start Date End Date Taking? Authorizing Provider  Cholecalciferol (VITAMIN D3) 5000 units CAPS Take 5,000 Units by mouth daily.    Yes Historical Provider, MD  ferrous sulfate 325 (65 FE) MG tablet Take 325 mg by mouth daily with breakfast.   Yes Historical Provider, MD  lisinopril-hydrochlorothiazide (PRINZIDE,ZESTORETIC) 10-12.5 MG tablet TAKE 1 TABLET BY MOUTH DAILY. 04/30/15  Yes Hoyt Koch, MD  pantoprazole (PROTONIX) 40 MG tablet Take 1 tablet (40 mg total) by mouth daily. 03/12/16  Yes Hoyt Koch, MD  polyethylene glycol [facility] / GLYCOLAX) packet  Take 17 g by mouth daily.   Yes Historical Provider, MD  VYTORIN 10-20 MG tablet TAKE 1 TABLET BY MOUTH AT BEDTIME. 03/26/15  Yes Hoyt Koch, MD     Family History  Problem Relation Age of Onset  . Gallstones Mother   . Aneurysm Father     brain  . Heart disease Father     CAD  . Breast cancer Sister   . Breast cancer Sister   . Heart attack Brother   . Cancer Brother   . Lung cancer Son   . Diabetes Neg Hx     Social History   Social History  . Marital status: Married    Spouse name: N/A  . Number of children: 1  . Years of education: N/A   Occupational History  . retired Retired   Social History Main Topics  . Smoking status: Former Smoker    Packs/day: 0.50    Years: 50.00    Types: Cigarettes    Quit date: 10/31/1994  . Smokeless tobacco: Never Used     Comment: SMOKED SOME; QUIT 1996;   . Alcohol use 0.0 oz/week     Comment: wine  . Drug use: No  . Sexual activity: Not Asked   Other Topics Concern  . 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              Review of Systems: A 12 point ROS discussed and pertinent positives are indicated in the HPI above.  All other systems are negative.  Review of Systems  Vital Signs: BP 132/70 (BP Location: Left Arm)   Pulse 66   Temp 98.6 F (37 C) (Oral)   Resp 18   Wt 153 lb 12.8 oz (69.8 kg)   SpO2 99%   BMI 24.45 kg/m   Physical Exam  Mallampati Score:  MD Evaluation Airway: WNL Heart: WNL Abdomen: WNL Chest/ Lungs: WNL ASA  Classification: 3 Mallampati/Airway Score: Two  Imaging: Ct Chest W Contrast  Result Date: 04/09/2016 CLINICAL DATA:  82 year old female admitted with epigastric abdominal pain, weight loss, large gastric mass and findings of metastatic disease in the liver and  peritoneal cavity. Chest staging. EXAM: CT CHEST WITH CONTRAST TECHNIQUE: Multidetector CT imaging of the chest was performed during intravenous contrast administration. CONTRAST:  75 cc ISOVUE-300 IOPAMIDOL (ISOVUE-300) INJECTION 61% COMPARISON:  04/08/2016 CT abdomen/ pelvis. FINDINGS: Cardiovascular: Top-normal heart size. No significant pericardial fluid/thickening. Atherosclerotic nonaneurysmal thoracic aorta. Top-normal caliber main pulmonary artery (3.2 cm diameter). No central pulmonary emboli. Mediastinum/Nodes: No discrete thyroid nodules. Unremarkable esophagus. No pathologically enlarged axillary, mediastinal or hilar lymph nodes. Subcentimeter coarsely calcified granulomatous nodes in the bilateral hilum. Lungs/Pleura: No pneumothorax. No pleural effusion. Peripheral right upper lobe 3 mm solid pulmonary nodule (series 7/ image 50). There is mass-effect on the basilar left lower lobe by a 1.9 x 1.6 cm mass at the superior margin of the spleen (series 3/ image 103), favored to represent a peritoneal metastasis. No acute consolidative airspace disease or additional significant pulmonary nodules. Upper abdomen: Re- demonstrated are numerous (greater than 10) hypodense masses scattered throughout the visualized liver measuring up to the 1.9 x 1.7 cm in the lateral segment left liver lobe (series 3/ image 132). Re- demonstrated and partially visualized is a large irregular exophytic mass arising from the proximal stomach. There are several masses in the left upper quadrant between the spleen and gastric fundus measuring up to the 4.0 x 3.5 cm (series 3/ image 121). Musculoskeletal: No aggressive appearing focal osseous lesions. Mild thoracic spondylosis. IMPRESSION: 1. No definite findings of metastatic disease in the chest. 2. Solitary 3 mm peripheral right upper lobe pulmonary nodule, for which a follow-up chest CT is advised in 3 months. 3. Re- demonstration of large irregular exophytic proximal gastric  mass most consistent with primary gastric carcinoma. 4. Re- demonstration of innumerable liver masses most consistent with liver metastases. 5. Re- demonstration of multiple peritoneal masses in the left upper quadrant most consistent with peritoneal metastases. 6. Aortic atherosclerosis. Electronically Signed   By: Ilona Sorrel M.D.   On: 04/09/2016 09:26   Ct Abdomen Pelvis W Contrast  Result Date: 04/08/2016 CLINICAL DATA:  Epigastric pain, anemia, colon polyps, weight loss EXAM: CT ABDOMEN AND PELVIS WITH CONTRAST TECHNIQUE: Multidetector CT imaging of the abdomen and pelvis was performed using the standard protocol following bolus administration of intravenous contrast. CONTRAST:  1mL ISOVUE-300 IOPAMIDOL (ISOVUE-300) INJECTION 61% COMPARISON:  None. FINDINGS: Lower chest: Lung bases are unremarkable Hepatobiliary: Multiple nodular densities are noted throughout the liver the largest in left hepatic lobe measures 2 cm findings are consistent with metastatic disease. Pancreas: Enhanced pancreas is unremarkable. Spleen: Enhanced spleen is unremarkable. There is a nodular lesion just medial to the spleen measures 3.9 cm highly suspicious for metastatic disease. Adrenals/Urinary Tract:  No adrenal gland mass. Kidneys are symmetrical in size and enhancement. No hydronephrosis or hydroureter. Delayed renal images shows bilateral renal symmetrical excretion. Stomach/Bowel: There is large exophytic infiltrating mass in proximal lateral wall of the stomach. The mass measures at least 10 by 7.4 cm. There is intraluminal debris within mass and the lumen communicates with main gastric lumen. Findings highly suspicious for malignant gastrointestinal stromal tumor. No small bowel obstruction. There is no evidence of free abdominal air. Vascular/Lymphatic: No aortic aneurysm. Atherosclerotic plaques and calcifications are noted abdominal aorta and iliac arteries. There is anterior omental lesion measures at least 6.2 x  4.3 cm. Second omental lesion axial image 46 measures 4.3 by 3.8 cm. Additional smaller omental nodules are noted the largest measures 5 cm. Findings are highly suspicious for omental caking/ metastatic disease. There is a lesion in right cul-de-sac measures 2.5 cm highly suspicious for drop metastasis. Reproductive: The uterus is atrophic.  No adnexal masses noted. Other: The urinary bladder is unremarkable. There is no abdominal ascites. Musculoskeletal: No destructive bony lesions are noted. Disc space flattening with vacuum disc phenomenon noted at L5-S1 level. IMPRESSION: 1. There is large exophytic mass in proximal lateral stomach measures at least 10 x 7.4 cm highly suspicious for malignant gastric tumor. 2. There are nodular lesions within omentum and just medial to the spleen highly suspicious for metastatic disease. 3. Multiple liver nodules are noted consistent with metastatic disease. 4. No hydronephrosis or hydroureter. 5. No small bowel obstruction. These results were called by telephone at the time of interpretation on 04/08/2016 at 12:54 pm to Dr. Zenovia Jarred , who verbally acknowledged these results. Electronically Signed   By: Lahoma Crocker M.D.   On: 04/08/2016 12:56    Labs:  CBC:  Recent Labs  04/08/16 1101 04/09/16 0427 04/10/16 0407 04/11/16 0608  WBC 12.1* 8.2 7.6 6.7  HGB 10.0* 8.9* 8.6* 9.0*  HCT 32.5* 29.3* 28.0* 29.6*  PLT 385 351 341 332    COAGS:  Recent Labs  04/11/16 1043  INR 1.05  APTT 31    BMP:  Recent Labs  04/08/16 1101 04/09/16 0427 04/10/16 0407 04/11/16 0608  NA 141 142 140 140  K 4.1 3.4* 3.3* 3.9  CL 103 104 106 108  CO2 28 28 26 26   GLUCOSE 105* 99 103* 107*  BUN 14 12 12 10   CALCIUM 9.6 8.8* 8.2* 8.6*  CREATININE 1.39* 1.33* 1.09* 0.98  GFRNONAA 35* 37* 47* 53*  GFRAA 40* 43* 54* >60    LIVER FUNCTION TESTS:  Recent Labs  04/24/15 1056 03/04/16 1027 04/08/16 1101 04/09/16 0427  BILITOT 0.4 0.3 0.7 0.6  AST 20 18  25 21   ALT 14 8 11* 8*  ALKPHOS 99 74 75 64  PROT 8.0 6.9 7.0 6.1*  ALBUMIN 4.3 3.3* 3.1* 2.6*    TUMOR MARKERS: No results for input(s): AFPTM, CEA, CA199, CHROMGRNA in the last 8760 hours.  Assessment and Plan: Gastric mass Peritoneal masses IR is unable to do biopsy today, and therefore pt will be discharged. IR has scheduled pt for CT biopsy on Monday 10/30 at 0800 to be done as outpt. Instructions given to pt and will also be left in DC planning.   Thank you for this interesting consult.  I greatly enjoyed meeting RUSSIE PINHO and look forward to participating in their care.  A copy of this report was sent to the requesting provider on this date.  Electronically Signed: Ascencion Dike 04/11/2016, 2:43 PM  I spent a total of 20 in face to face in clinical consultation, greater than 50% of which was counseling/coordinating care for peritoneal mass biopsy

## 2016-04-14 ENCOUNTER — Ambulatory Visit (HOSPITAL_COMMUNITY)
Admit: 2016-04-14 | Discharge: 2016-04-14 | Disposition: A | Discharge status: Home / Self Care | Payer: Medicare Other | Source: Ambulatory Visit | Attending: Hematology | Admitting: Hematology

## 2016-04-14 ENCOUNTER — Encounter (HOSPITAL_COMMUNITY): Payer: Self-pay

## 2016-04-14 DIAGNOSIS — C49A9 Gastrointestinal stromal tumor of other sites: Secondary | ICD-10-CM | POA: Diagnosis not present

## 2016-04-14 DIAGNOSIS — Z8249 Family history of ischemic heart disease and other diseases of the circulatory system: Secondary | ICD-10-CM | POA: Diagnosis not present

## 2016-04-14 DIAGNOSIS — Z803 Family history of malignant neoplasm of breast: Secondary | ICD-10-CM | POA: Diagnosis not present

## 2016-04-14 DIAGNOSIS — Z86010 Personal history of colonic polyps: Secondary | ICD-10-CM | POA: Insufficient documentation

## 2016-04-14 DIAGNOSIS — I10 Essential (primary) hypertension: Secondary | ICD-10-CM | POA: Diagnosis not present

## 2016-04-14 DIAGNOSIS — E785 Hyperlipidemia, unspecified: Secondary | ICD-10-CM | POA: Insufficient documentation

## 2016-04-14 DIAGNOSIS — Z6824 Body mass index (BMI) 24.0-24.9, adult: Secondary | ICD-10-CM | POA: Diagnosis not present

## 2016-04-14 DIAGNOSIS — Z8379 Family history of other diseases of the digestive system: Secondary | ICD-10-CM | POA: Insufficient documentation

## 2016-04-14 DIAGNOSIS — K669 Disorder of peritoneum, unspecified: Secondary | ICD-10-CM | POA: Diagnosis present

## 2016-04-14 DIAGNOSIS — R634 Abnormal weight loss: Secondary | ICD-10-CM | POA: Diagnosis not present

## 2016-04-14 DIAGNOSIS — Z801 Family history of malignant neoplasm of trachea, bronchus and lung: Secondary | ICD-10-CM | POA: Insufficient documentation

## 2016-04-14 DIAGNOSIS — K3189 Other diseases of stomach and duodenum: Secondary | ICD-10-CM

## 2016-04-14 DIAGNOSIS — Z79899 Other long term (current) drug therapy: Secondary | ICD-10-CM | POA: Diagnosis not present

## 2016-04-14 DIAGNOSIS — R1013 Epigastric pain: Secondary | ICD-10-CM | POA: Diagnosis not present

## 2016-04-14 DIAGNOSIS — Z87891 Personal history of nicotine dependence: Secondary | ICD-10-CM | POA: Insufficient documentation

## 2016-04-14 DIAGNOSIS — D4989 Neoplasm of unspecified behavior of other specified sites: Secondary | ICD-10-CM

## 2016-04-14 DIAGNOSIS — R630 Anorexia: Secondary | ICD-10-CM | POA: Diagnosis not present

## 2016-04-14 MED ORDER — MIDAZOLAM HCL 2 MG/2ML IJ SOLN
INTRAMUSCULAR | Status: AC
  Filled 2016-04-14: qty 2

## 2016-04-14 MED ORDER — FENTANYL CITRATE (PF) 100 MCG/2ML IJ SOLN
INTRAMUSCULAR | Status: AC
  Filled 2016-04-14: qty 2

## 2016-04-14 MED ORDER — SODIUM CHLORIDE 0.9 % IV SOLN
INTRAVENOUS | Status: DC
  Administered 2016-04-14: 07:00:00 via INTRAVENOUS

## 2016-04-14 MED ORDER — LIDOCAINE HCL 1 % IJ SOLN
INTRAMUSCULAR | Status: AC
Start: 2016-04-14 — End: 2016-04-14
  Filled 2016-04-14: qty 20

## 2016-04-14 MED ORDER — MIDAZOLAM HCL 2 MG/2ML IJ SOLN
INTRAMUSCULAR | Status: AC | PRN
  Administered 2016-04-14: 0.5 mg via INTRAVENOUS
  Administered 2016-04-14: 1 mg via INTRAVENOUS

## 2016-04-14 MED ORDER — FENTANYL CITRATE (PF) 100 MCG/2ML IJ SOLN
INTRAMUSCULAR | Status: AC | PRN
  Administered 2016-04-14: 50 ug via INTRAVENOUS
  Administered 2016-04-14: 25 ug via INTRAVENOUS

## 2016-04-14 MED ORDER — HYDROCODONE-ACETAMINOPHEN 5-325 MG PO TABS
1.0000 | ORAL_TABLET | ORAL | Status: DC | PRN
  Filled 2016-04-14: qty 2

## 2016-04-14 NOTE — Sedation Documentation (Signed)
Patient denies pain and is resting comfortably.  

## 2016-04-14 NOTE — Sedation Documentation (Signed)
Patient is resting comfortably. 

## 2016-04-14 NOTE — H&P (Signed)
Chief Complaint: Patient was seen in consultation today for peritoneal mass biopsy at the request of Dr Truitt Merle  Referring Physician(s): Dr Truitt Merle  Supervising Physician: Markus Daft  Patient Status: [facility] - [facility]  History of Present Illness: Haley Mcmillan is a 82 y.o. female   HTN Progressive weight loss  abdominal pain Anorexia Work up: IMPRESSION: 1. There is large exophytic mass in proximal lateral stomach measures at least 10 x 7.4 cm highly suspicious for malignant gastric tumor. 2. There are nodular lesions within omentum and just medial to the spleen highly suspicious for metastatic disease. 3. Multiple liver nodules are noted consistent with metastatic disease. 4. No hydronephrosis or hydroureter. 5. No small bowel obstruction.  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 Anemia   . Arthritis   . Colon polyps   . Olecranon bursitis    Elbow  . Other and unspecified hyperlipidemia   . Palpitations   . Unspecified essential hypertension     Past Surgical History:  Procedure Laterality Date  . COLONOSCOPY W/ POLYPECTOMY  01/2004  . CRYOTHERAPY     female-cryosurgery stated by pt  . ESOPHAGOGASTRODUODENOSCOPY N/A 04/09/2016   Procedure: ESOPHAGOGASTRODUODENOSCOPY (EGD);  Surgeon: Doran Stabler, MD;  Location: [facility] ENDOSCOPY;  Service: Endoscopy;  Laterality: N/A;  . POLYPECTOMY     from vocal surgery    Allergies: Review of patient's allergies indicates no known allergies.  Medications: Prior to Admission medications   Medication Sig Start Date End Date Taking? Authorizing Provider  Cholecalciferol (VITAMIN D3) 5000 units CAPS Take 5,000 Units by mouth daily.    Yes Historical Provider, MD  docusate sodium (COLACE) 100 MG capsule Take  1 capsule (100 mg total) by mouth daily. 04/11/16  Yes Belkys A Regalado, MD  feeding supplement, ENSURE ENLIVE, (ENSURE ENLIVE) LIQD Take 237 mLs by mouth 3 (three) times daily between meals. 04/11/16  Yes Belkys A Regalado, MD  ferrous sulfate 325 (65 FE) MG tablet Take 1 tablet (325 mg total) by mouth 2 (two) times daily with a meal. 04/11/16  Yes Belkys A Regalado, MD  ondansetron (ZOFRAN) 4 MG tablet Take 1 tablet (4 mg total) by mouth every 6 (six) hours as needed for nausea. 04/11/16  Yes Belkys A Regalado, MD  pantoprazole (PROTONIX) 40 MG tablet Take 1 tablet (40 mg total) by mouth daily. 03/12/16  Yes Hoyt Koch, MD  senna-docusate (SENOKOT-S) 8.6-50 MG tablet Take 1 tablet by mouth at bedtime as needed for mild constipation. 04/11/16  Yes Belkys A Regalado, MD  traMADol (ULTRAM) 50 MG tablet Take 1 tablet (50 mg total) by mouth every 6 (six) hours as needed. 04/11/16  Yes Elmarie Shiley, MD     Family History  Problem Relation Age of Onset  . Gallstones Mother   . Aneurysm Father     brain  . Heart disease Father     CAD  . Breast cancer Sister   . Breast cancer Sister   . Heart attack Brother   . Cancer Brother   . Lung cancer Son   . Diabetes Neg Hx     Social History   Social History  . Marital status: Married    Spouse name: N/A  . Number of children: 1  .  Years of education: N/A   Occupational History  . retired Retired   Social History Main Topics  . Smoking status: Former Smoker    Packs/day: 0.50    Years: 50.00    Types: Cigarettes    Quit date: 10/31/1994  . Smokeless tobacco: Never Used     Comment: SMOKED SOME; QUIT 1996;   . Alcohol use 0.0 oz/week     Comment: wine  . Drug use: No  . Sexual activity: Not Asked   Other Topics Concern  . 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             Review of Systems: A 12 point ROS discussed and pertinent positives are indicated in the HPI above.  All other systems are negative.  Review of Systems  Constitutional: Positive for appetite change, fatigue and unexpected weight change. Negative for activity change and fever.  Respiratory: Negative for shortness of breath.   Cardiovascular: Negative for chest pain.  Gastrointestinal: Positive for abdominal pain and nausea.  Psychiatric/Behavioral: Negative for behavioral problems and confusion.    Vital Signs: BP (!) 129/59   Pulse 64   Temp 97.9 F (36.6 C) (Oral)   Resp 18   Ht 5' 6.5" (1.689 m)   Wt 153 lb (69.4 kg)   SpO2 100%   BMI 24.32 kg/m   Physical Exam  Constitutional: She appears well-developed.  Cardiovascular: Normal rate, regular rhythm and normal heart sounds.   Pulmonary/Chest: Effort normal and breath sounds normal. She has no wheezes.  Abdominal: Soft. Bowel sounds are normal. There is no tenderness.  Musculoskeletal: Normal range of motion.  Neurological: She is alert.  Skin: Skin is warm and dry.  Psychiatric: She has a normal mood and affect. Her behavior is normal. Judgment and thought content normal.  Nursing note and vitals reviewed.   Mallampati Score:  MD Evaluation Airway: WNL Heart: WNL Abdomen: WNL Chest/ Lungs: WNL ASA  Classification: 3 Mallampati/Airway Score: Two  Imaging: Ct Chest W Contrast  Result Date: 04/09/2016 CLINICAL DATA:  82 year old female admitted with epigastric abdominal pain, weight loss, large gastric mass and findings of metastatic disease in the liver and peritoneal cavity. Chest staging. EXAM: CT CHEST WITH CONTRAST TECHNIQUE: Multidetector CT imaging of the chest was performed during intravenous contrast administration. CONTRAST:  75 cc ISOVUE-300 IOPAMIDOL (ISOVUE-300) INJECTION 61% COMPARISON:  04/08/2016 CT abdomen/ pelvis. FINDINGS:  Cardiovascular: Top-normal heart size. No significant pericardial fluid/thickening. Atherosclerotic nonaneurysmal thoracic aorta. Top-normal caliber main pulmonary artery (3.2 cm diameter). No central pulmonary emboli. Mediastinum/Nodes: No discrete thyroid nodules. Unremarkable esophagus. No pathologically enlarged axillary, mediastinal or hilar lymph nodes. Subcentimeter coarsely calcified granulomatous nodes in the bilateral hilum. Lungs/Pleura: No pneumothorax. No pleural effusion. Peripheral right upper lobe 3 mm solid pulmonary nodule (series 7/ image 50). There is mass-effect on the basilar left lower lobe by a 1.9 x 1.6 cm mass at the superior margin of the spleen (series 3/ image 103), favored to represent a peritoneal metastasis. No acute consolidative airspace disease or additional significant pulmonary nodules. Upper abdomen: Re- demonstrated are numerous (greater than 10) hypodense masses scattered throughout the visualized liver measuring up to the 1.9 x 1.7 cm in the lateral segment left liver lobe (  series 3/ image 132). Re- demonstrated and partially visualized is a large irregular exophytic mass arising from the proximal stomach. There are several masses in the left upper quadrant between the spleen and gastric fundus measuring up to the 4.0 x 3.5 cm (series 3/ image 121). Musculoskeletal: No aggressive appearing focal osseous lesions. Mild thoracic spondylosis. IMPRESSION: 1. No definite findings of metastatic disease in the chest. 2. Solitary 3 mm peripheral right upper lobe pulmonary nodule, for which a follow-up chest CT is advised in 3 months. 3. Re- demonstration of large irregular exophytic proximal gastric mass most consistent with primary gastric carcinoma. 4. Re- demonstration of innumerable liver masses most consistent with liver metastases. 5. Re- demonstration of multiple peritoneal masses in the left upper quadrant most consistent with peritoneal metastases. 6. Aortic atherosclerosis.  Electronically Signed   By: Ilona Sorrel M.D.   On: 04/09/2016 09:26   Ct Abdomen Pelvis W Contrast  Result Date: 04/08/2016 CLINICAL DATA:  Epigastric pain, anemia, colon polyps, weight loss EXAM: CT ABDOMEN AND PELVIS WITH CONTRAST TECHNIQUE: Multidetector CT imaging of the abdomen and pelvis was performed using the standard protocol following bolus administration of intravenous contrast. CONTRAST:  30m ISOVUE-300 IOPAMIDOL (ISOVUE-300) INJECTION 61% COMPARISON:  None. FINDINGS: Lower chest: Lung bases are unremarkable Hepatobiliary: Multiple nodular densities are noted throughout the liver the largest in left hepatic lobe measures 2 cm findings are consistent with metastatic disease. Pancreas: Enhanced pancreas is unremarkable. Spleen: Enhanced spleen is unremarkable. There is a nodular lesion just medial to the spleen measures 3.9 cm highly suspicious for metastatic disease. Adrenals/Urinary Tract: No adrenal gland mass. Kidneys are symmetrical in size and enhancement. No hydronephrosis or hydroureter. Delayed renal images shows bilateral renal symmetrical excretion. Stomach/Bowel: There is large exophytic infiltrating mass in proximal lateral wall of the stomach. The mass measures at least 10 by 7.4 cm. There is intraluminal debris within mass and the lumen communicates with main gastric lumen. Findings highly suspicious for malignant gastrointestinal stromal tumor. No small bowel obstruction. There is no evidence of free abdominal air. Vascular/Lymphatic: No aortic aneurysm. Atherosclerotic plaques and calcifications are noted abdominal aorta and iliac arteries. There is anterior omental lesion measures at least 6.2 x 4.3 cm. Second omental lesion axial image 46 measures 4.3 by 3.8 cm. Additional smaller omental nodules are noted the largest measures 5 cm. Findings are highly suspicious for omental caking/ metastatic disease. There is a lesion in right cul-de-sac measures 2.5 cm highly suspicious for  drop metastasis. Reproductive: The uterus is atrophic.  No adnexal masses noted. Other: The urinary bladder is unremarkable. There is no abdominal ascites. Musculoskeletal: No destructive bony lesions are noted. Disc space flattening with vacuum disc phenomenon noted at L5-S1 level. IMPRESSION: 1. There is large exophytic mass in proximal lateral stomach measures at least 10 x 7.4 cm highly suspicious for malignant gastric tumor. 2. There are nodular lesions within omentum and just medial to the spleen highly suspicious for metastatic disease. 3. Multiple liver nodules are noted consistent with metastatic disease. 4. No hydronephrosis or hydroureter. 5. No small bowel obstruction. These results were called by telephone at the time of interpretation on 04/08/2016 at 12:54 pm to Dr. CZenovia Jarred, who verbally acknowledged these results. Electronically Signed   By: LLahoma CrockerM.D.   On: 04/08/2016 12:56    Labs:  CBC:  Recent Labs  04/08/16 1101 04/09/16 0427 04/10/16 0407 04/11/16 0608  WBC 12.1* 8.2 7.6 6.7  HGB 10.0* 8.9* 8.6* 9.0*  HCT  32.5* 29.3* 28.0* 29.6*  PLT 385 351 341 332    COAGS:  Recent Labs  04/11/16 1043  INR 1.05  APTT 31    BMP:  Recent Labs  04/08/16 1101 04/09/16 0427 04/10/16 0407 04/11/16 0608  NA 141 142 140 140  K 4.1 3.4* 3.3* 3.9  CL 103 104 106 108  CO2 _0 GLUCOSE 105* 99 103* 107*  BUN _1 CALCIUM 9.6 8.8* 8.2* 8.6*  CREATININE 1.39* 1.33* 1.09* 0.98  GFRNONAA 35* 37* 47* 53*  GFRAA 40* 43* 54* >60    LIVER FUNCTION TESTS:  Recent Labs  04/24/15 1056 03/04/16 1027 04/08/16 1101 04/09/16 0427  BILITOT 0.4 0.3 0.7 0.6  AST _2 ALT 14 8 11* 8*  ALKPHOS 99 74 75 64  PROT 8.0 6.9 7.0 6.1*  ALBUMIN 4.3 3.3* 3.1* 2.6*    TUMOR MARKERS: No results for input(s): AFPTM, CEA, CA199, CHROMGRNA in the last 8760 hours.  Assessment and Plan:  New dx GIST Gastrointestinal stromal tumor Noted peritoneal  mass For biopsy today to determine metastatic disease Risks and Benefits discussed with the patient including, but not limited to bleeding, infection, damage to adjacent structures or low yield requiring additional tests. All of the patient's questions were answered, patient is agreeable to proceed. Consent signed and in chart.  Thank you for this interesting consult.  I greatly enjoyed meeting ROSI SECRIST and look forward to participating in their care.  A copy of this report was sent to the requesting provider on this date.  Electronically Signed: Finnian Husted A 04/14/2016, 7:31 AM   I spent a total of  30 Minutes   in face to face in clinical consultation, greater than 50% of which was counseling/coordinating care for peritoneal mass biopsy

## 2016-04-14 NOTE — Procedures (Signed)
CT guided core biopsies of a peritoneal mass.   5 cores obtained and specimens placed in saline.  Minimal blood loss and no immediate complication.  See full report in PACS.

## 2016-04-14 NOTE — Discharge Instructions (Signed)
Needle Biopsy, Care After °These instructions give you information about caring for yourself after your procedure. Your doctor may also give you more specific instructions. Call your doctor if you have any problems or questions after your procedure. °HOME CARE °· Rest as told by your doctor. °· Take medicines only as told by your doctor. °· There are many different ways to close and cover the biopsy site, including stitches (sutures), skin glue, and adhesive strips. Follow instructions from your doctor about: °¨ How to take care of your biopsy site. °¨ When and how you should change your bandage (dressing). °¨ When you should remove your dressing. °¨ Removing whatever was used to close your biopsy site. °· Check your biopsy site every day for signs of infection. Watch for: °¨ Redness, swelling, or pain. °¨ Fluid, blood, or pus. °GET HELP IF: °· You have a fever. °· You have redness, swelling, or pain at the biopsy site, and it lasts longer than a few days. °· You have fluid, blood, or pus coming from the biopsy site. °· You feel sick to your stomach (nauseous). °· You throw up (vomit). °GET HELP RIGHT AWAY IF: °· You are short of breath. °· You have trouble breathing. °· Your chest hurts. °· You feel dizzy or you pass out (faint). °· You have bleeding that does not stop with pressure or a bandage. °· You cough up blood. °· Your belly (abdomen) hurts. °  °This information is not intended to replace advice given to you by your health care provider. Make sure you discuss any questions you have with your health care provider. °  °Document Released: 05/15/2008 Document Revised: 10/17/2014 Document Reviewed: 05/29/2014 °Elsevier Interactive Patient Education ©2016 Elsevier Inc. ° °

## 2016-04-15 ENCOUNTER — Other Ambulatory Visit: Payer: Self-pay | Admitting: Hematology

## 2016-04-15 DIAGNOSIS — C49A2 Gastrointestinal stromal tumor of stomach: Secondary | ICD-10-CM

## 2016-04-15 MED ORDER — IMATINIB MESYLATE 400 MG PO TABS: 400.0000 mg | ORAL_TABLET | Freq: Every day | ORAL | 2 refills | Status: DC

## 2016-04-16 ENCOUNTER — Encounter: Payer: Self-pay | Admitting: Hematology

## 2016-04-16 ENCOUNTER — Encounter: Payer: Self-pay | Admitting: Pharmacist

## 2016-04-16 ENCOUNTER — Telehealth: Payer: Self-pay | Admitting: Hematology

## 2016-04-16 DIAGNOSIS — C49A2 Gastrointestinal stromal tumor of stomach: Secondary | ICD-10-CM

## 2016-04-16 NOTE — Telephone Encounter (Signed)
Tc to schedule an appt w/Dr. Burr Medico. Appt scheduled for 11/6 at 330pm. Pt agreed. Pt aware to arrive 30 minutes early. Demographics verified. Letter mailed to the pt.

## 2016-04-16 NOTE — Progress Notes (Signed)
Oral Chemotherapy Pharmacist Encounter  Received prescription for imatinib 400mg . Labs from 10/25 and 10/27 reviewed, ok for treatment.  Current medication list in Epis assessed, no DDIs with imatinib identified.  Prescription will be sent to [facility] from benefits analysis.  Johny Drilling, PharmD, BCPS 04/16/2016  12:08 PM Oral Chemotherapy Clinic [phone]

## 2016-04-16 NOTE — Progress Notes (Signed)
Oral Chemotherapy Pharmacist Encounter  Received notification from [place] that patient's [facility] prescription will require prior authorization PA submitted through [email or web address] [facility]  PA fax request sent electronically to [facility], pending, may take 5 business days for determination.  Oral Chemo Clinic will continue to follow.  Johny Drilling, PharmD, BCPS 04/16/2016  3:49 PM Oral Chemotherapy Clinic [phone]

## 2016-04-17 MED FILL — IMATINIB MESYLATE 400 MG TA: 400 | 30 days supply | Qty: 30 | Fill #0

## 2016-04-20 DIAGNOSIS — C49A2 Gastrointestinal stromal tumor of stomach: Secondary | ICD-10-CM | POA: Insufficient documentation

## 2016-04-20 NOTE — Progress Notes (Signed)
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      04/09/2016 Initial Biopsy    Gastric mass biopsy showed gastrointestinal stromal tumor, I feel mitosis and necrosis, intermediate to high-grade.      04/09/2016 Imaging    CT chest, abdomen and pelvis with contrast showed a large exophytic mass in the proximal lateral stomach measuring 10 x 7.4 cm, nodular lesions within omentum and multiple liver nodules are highly suspicious for metastatic disease.      04/14/2016 Procedure    Peritoneal mass biopsy showed gastrointestinal stromal tumor      History of present illness (04/10/2016):   Haley Mcmillanis a 82 y.o.femalewith medical history significantfor anemia, arthritis, colonic polyps, hyperlipidemia, hypertension presents to the emergency department after recent GI visit with worsening abdominal pain. She states the pain is intermittent, started about 2 weeks ago, located in the epigastric area, no significant nausea or change of her bowel habits. Her appetite and energy level has decreased lately, although she is able to take care of herself. When the pain  resolves, she remains to be active and do house cleaning etc. he has lost about 30 pounds in the past 10-12 months, and the most weight with loss in the past few months. A CT scan showed a large exophytic mass in proximal lateral stomach, measuring 10 x 7.4 cm, and nodular lesions in the omentum, and a multiple liver lesions suspicious for metastatic disease. She underwent EGD by Dr. Dennis yesterday, which showed a large gastric tumor in the fundus, biopsy was done, pathology results still pending.  CURRENT THERAPY: pending Gleevec 400mg daily, starting 04/22/2016  INTERVAL HISTORY: Haley Mcmillan returns in for follow-up, I initially saw in the hospital a few weeks ago. She is feeling well overall, had a few episodes of epigastric pain, took tramadol, which helped  No anusea or diarrhea Eats well, gained 3 lbs, she is constipated, last BM 4 days ago, she used to take MiraLAX which worked well, MiraLAX was switched to Senna-S after recent hospital discharge  No black stool, or hematochezia.  REVIEW OF SYSTEMS:   Constitutional: Denies fevers, chills or abnormal weight loss, (+) mild fatigue, able to function well Eyes: Denies blurriness of vision Ears, nose, mouth, throat, and face: Denies mucositis or sore throat Respiratory: Denies cough, dyspnea or wheezes Cardiovascular: Denies palpitation, chest discomfort or lower extremity swelling Gastrointestinal:  Denies nausea, heartburn or change in bowel habits Skin: Denies abnormal skin rashes Lymphatics: Denies new lymphadenopathy or easy bruising Neurological:Denies numbness, tingling or new weaknesses Behavioral/Psych: Mood is stable, no new changes  All other systems were reviewed with the patient and are negative.  MEDICAL HISTORY:    Past Medical History:  Diagnosis Date  . Anemia   . Arthritis   . Colon polyps   . Olecranon bursitis    Elbow  . Other and unspecified hyperlipidemia   . Palpitations   . Unspecified essential  hypertension     SURGICAL HISTORY: Past Surgical History:  Procedure Laterality Date  . COLONOSCOPY W/ POLYPECTOMY  01/2004  . CRYOTHERAPY     female-cryosurgery stated by pt  . ESOPHAGOGASTRODUODENOSCOPY N/A 04/09/2016   Procedure: ESOPHAGOGASTRODUODENOSCOPY (EGD);  Surgeon: Henry L Danis III, MD;  Location: [facility] ENDOSCOPY;  Service: Endoscopy;  Laterality: N/A;  . POLYPECTOMY     from vocal surgery    I have reviewed the social history and family history with the patient and they are unchanged from previous note.  ALLERGIES:  has No Known Allergies.  MEDICATIONS:  Current Outpatient Prescriptions  Medication Sig Dispense Refill  . Cholecalciferol (VITAMIN D3) 5000 units CAPS Take 5,000 Units by mouth daily.     . docusate sodium (COLACE) 100 MG capsule Take 1 capsule (100 mg total) by mouth daily. 10 capsule 0  . feeding supplement, ENSURE ENLIVE, (ENSURE ENLIVE) LIQD Take 237 mLs by mouth 3 (three) times daily between meals. 237 mL 12  . ferrous sulfate 325 (65 FE) MG tablet Take 1 tablet (325 mg total) by mouth 2 (two) times daily with a meal. 60 tablet 3  . ondansetron (ZOFRAN) 4 MG tablet Take 1 tablet (4 mg total) by mouth every 6 (six) hours as needed for nausea. 20 tablet 0  . pantoprazole (PROTONIX) 40 MG tablet Take 1 tablet (40 mg total) by mouth daily. 30 tablet 3  . senna-docusate (SENOKOT-S) 8.6-50 MG tablet Take 1 tablet by mouth at bedtime as needed for mild constipation. 30 tablet 0  . traMADol (ULTRAM) 50 MG tablet Take 1 tablet (50 mg total) by mouth every 6 (six) hours as needed. 30 tablet 0  . imatinib (GLEEVEC) 400 MG tablet Take 1 tablet (400 mg total) by mouth daily. Take with meals and large glass of water.Caution:Chemotherapy. (Patient not taking: Reported on 04/21/2016) 30 tablet 2   No current facility-administered medications for this visit.     PHYSICAL EXAMINATION: ECOG PERFORMANCE STATUS: 1 - Symptomatic but completely ambulatory  Vitals:    04/21/16 1603  BP: (!) 151/56  Pulse: 90  Resp: 18  Temp: 100 F (37.8 C)   Filed Weights   04/21/16 1603  Weight: 154 lb 3.2 oz (69.9 kg)    GENERAL:alert, no distress and comfortable SKIN: skin color, texture, turgor are normal, no rashes or significant lesions EYES: normal, Conjunctiva are pink and non-injected, sclera clear OROPHARYNX:no exudate, no erythema and lips, buccal mucosa, and tongue normal  NECK: supple, thyroid normal size, non-tender, without nodularity LYMPH:  no palpable lymphadenopathy in the cervical, axillary or inguinal LUNGS: clear to auscultation and percussion with normal breathing effort HEART: regular rate & rhythm and no murmurs and no lower extremity edema ABDOMEN:abdomen soft, non-tender and normal bowel sounds Musculoskeletal:no cyanosis of digits and no clubbing  NEURO: alert & oriented x 3 with fluent speech, no focal motor/sensory deficits  LABORATORY DATA:  I have reviewed the data as listed CBC Latest Ref Rng & Units 04/21/2016 04/11/2016 04/10/2016  WBC 3.9 - 10.3 10e3/uL 12.7(H) 6.7 7.6  Hemoglobin 11.6 - 15.9 g/dL 9.9(L) 9.0(L) 8.6(L)  Hematocrit 34.8 - 46.6 % 31.2(L) 29.6(L) 28.0(L)  Platelets 145 - 400 10e3/uL 316 332 341     CMP Latest Ref   Rng & Units 04/21/2016 04/11/2016 04/10/2016  Glucose 70 - 140 mg/dl 127 107(H) 103(H)  BUN 7.0 - 26.0 mg/dL 14._0 Creatinine 0.6 - 1.1 mg/dL 1.0 0.98 1.09(H)  Sodium 136 - 145 mEq/L 139 140 140  Potassium 3.5 - 5.1 mEq/L 4.0 3.9 3.3(L)  Chloride 101 - 111 mmol/L - 108 106  CO2 22 - 29 mEq/L _1 Calcium 8.4 - 10.4 mg/dL 9.3 8.6(L) 8.2(L)  Total Protein 6.4 - 8.3 g/dL 6.9 - -  Total Bilirubin 0.20 - 1.20 mg/dL <0.22 - -  Alkaline Phos 40 - 150 U/L 103 - -  AST 5 - 34 U/L 19 - -  ALT 0 - 55 U/L 10 - -   PATHOLOGY REPORT  Diagnosis 04/09/2016 Stomach, biopsy - GASTROINTESTINAL STROMAL TUMOR (GIST) - SEE COMMENT. Microscopic Comment The biopsy fragments reveal a hypercellular  spindle cell proliferation with cytologic atypia, a few mitoses and necrosis. 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 Immunohistochemical stains were performed revealing that the tumor cells are strongly positive for CD117 and CD34. They are negative for cytokeratin A18, cytokeratin AE1/AE3, desmin, smooth muscle actin, smooth muscle myosin and S100. Dr. Claudette Laws has reviewed the case and concurs with this interpretation. Dr. Annamaria Boots was paged on 04/11/16.   Diagnosis 04/14/2016 Peritoneum, biopsy, Anterior abdomen - GASTROINTESTINAL STROMAL TUMOR (GIST). - SEE COMMENT. Microscopic Comment The tumor is morphologically identical to the patient's previous biopsy ([id number]). Additional studies can be performed upon clinical request. (JBK:gt, 04/15/16)  RADIOGRAPHIC STUDIES: I have personally reviewed the radiological images as listed and agreed with the findings in the report. No results found.   CT chest w contrast 04/09/2016 IMPRESSION: 1. No definite findings of metastatic disease in the chest. 2. Solitary 3 mm peripheral right upper lobe pulmonary nodule, for which a follow-up chest CT is advised in 3 months. 3. Re- demonstration of large irregular exophytic proximal gastric mass most consistent with primary gastric carcinoma. 4. Re- demonstration of innumerable liver masses most consistent with liver metastases. 5. Re- demonstration of multiple peritoneal masses in the left upper quadrant most consistent with peritoneal metastases. 6. Aortic atherosclerosis.  CT abdomen and pelvis with contrast 04/09/2016 IMPRESSION: 1. There is large exophytic mass in proximal lateral stomach measures at least 10 x 7.4 cm highly suspicious for malignant gastric tumor. 2. There are nodular lesions within omentum and just medial to the spleen highly suspicious for metastatic disease. 3. Multiple liver nodules are noted  consistent with metastatic disease. 4. No hydronephrosis or hydroureter. 5. No small bowel obstruction  EGD 04/09/2016 Dr. Loletha Carrow  The esophagus was normal. Findings: A large, fungating and infiltrative, very deeply-ulcerated and necrotic, malignant-appearing mass with stigmata of recent bleeding was found in the gastric fundus. Biopsies were taken with a cold forceps for histology. The duodenal bulb was normal. - Normal esophagus. - Likely malignant gastric tumor in the gastric fundus. extensively biopsied. - Normal duodenal bulb.   ASSESSMENT & PLAN:  82 year old African-American female, with past medical history of hypertension, otherwise healthy and active, presented with progressive weight loss, fatigue, anorexia and intermittent epigastric pain.  1. Malignant gastrointestinal stromal tumor (GIST) of stomach, with liver and peritoneum metastasis -I reviewed her CT scan findings, gastric mass and peritoneal mass biopsy results, in details with patient and her family members -We will review the neck to history of GIST, and treatment options. 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 Anemia, iron deficiency -Her anemia has overall improved since she started iron pill. Her initial study will consistent with iron deficiency -Her hemoglobin is 9.9 today -I  encouraged her to increase ferrous sulfate to 2 tablets a day. If she does not have good response to oral iron, will consider IV iron  3. HTN -Continue medication, follow up with primary care physician  4. Abdominal pain  -Secondary to her peritoneal metastasis -Improved, she will use tramadol as needed  5. AKI -Resolved, creatinine 1.0 today  Plan -She'll start [place] tomorrow, 400 mg daily -Increase ferrous sulfate from 1 tablet to 2 tablets daily  -Nutrition consult -I'll see her back in 3 weeks for follow-up   All questions were answered. The patient knows to call the clinic with any problems, questions or concerns. No barriers to learning was detected. I spent 30 minutes counseling the patient face to face. The total time spent in the appointment was 40 minutes and more than 50% was on counseling and review of test results     Truitt Merle, MD 04/21/16

## 2016-04-21 ENCOUNTER — Other Ambulatory Visit (HOSPITAL_BASED_OUTPATIENT_CLINIC_OR_DEPARTMENT_OTHER): Payer: Medicare Other

## 2016-04-21 ENCOUNTER — Encounter: Payer: Self-pay | Admitting: *Deleted

## 2016-04-21 ENCOUNTER — Encounter: Payer: Self-pay | Admitting: Hematology

## 2016-04-21 ENCOUNTER — Ambulatory Visit (HOSPITAL_BASED_OUTPATIENT_CLINIC_OR_DEPARTMENT_OTHER): Payer: Medicare Other | Admitting: Hematology

## 2016-04-21 ENCOUNTER — Telehealth: Payer: Self-pay | Admitting: Hematology

## 2016-04-21 VITALS — BP 151/56 | HR 90 | Temp 100.0°F | Resp 18 | Ht 66.5 in | Wt 154.2 lb

## 2016-04-21 DIAGNOSIS — R634 Abnormal weight loss: Secondary | ICD-10-CM

## 2016-04-21 DIAGNOSIS — C786 Secondary malignant neoplasm of retroperitoneum and peritoneum: Secondary | ICD-10-CM | POA: Diagnosis not present

## 2016-04-21 DIAGNOSIS — C787 Secondary malignant neoplasm of liver and intrahepatic bile duct: Secondary | ICD-10-CM | POA: Diagnosis not present

## 2016-04-21 DIAGNOSIS — D509 Iron deficiency anemia, unspecified: Secondary | ICD-10-CM

## 2016-04-21 DIAGNOSIS — C49A2 Gastrointestinal stromal tumor of stomach: Secondary | ICD-10-CM

## 2016-04-21 DIAGNOSIS — I10 Essential (primary) hypertension: Secondary | ICD-10-CM

## 2016-04-21 DIAGNOSIS — R5383 Other fatigue: Secondary | ICD-10-CM | POA: Diagnosis not present

## 2016-04-21 DIAGNOSIS — R1013 Epigastric pain: Secondary | ICD-10-CM

## 2016-04-21 DIAGNOSIS — N179 Acute kidney failure, unspecified: Secondary | ICD-10-CM

## 2016-04-21 LAB — TSH: TSH: 1.011 m(IU)/L (ref 0.308–3.960)

## 2016-04-21 LAB — COMPREHENSIVE METABOLIC PANEL
ALT: 10 U/L (ref 0–55)
ANION GAP: 9 meq/L (ref 3–11)
AST: 19 U/L (ref 5–34)
Albumin: 2.7 g/dL — ABNORMAL LOW (ref 3.5–5.0)
Alkaline Phosphatase: 103 U/L (ref 40–150)
BUN: 14.1 mg/dL (ref 7.0–26.0)
CHLORIDE: 105 meq/L (ref 98–109)
CO2: 25 meq/L (ref 22–29)
Calcium: 9.3 mg/dL (ref 8.4–10.4)
Creatinine: 1 mg/dL (ref 0.6–1.1)
EGFR: 62 mL/min/{1.73_m2} — AB (ref >90)
GLUCOSE: 127 mg/dL (ref 70–140)
Potassium: 4 mEq/L (ref 3.5–5.1)
SODIUM: 139 meq/L (ref 136–145)
TOTAL PROTEIN: 6.9 g/dL (ref 6.4–8.3)
Total Bilirubin: <0.22 mg/dL (ref 0.20–1.20)

## 2016-04-21 LAB — CBC WITH DIFFERENTIAL/PLATELET
BASO%: 0.3 % (ref 0.0–2.0)
Basophils Absolute: 0 10*3/uL (ref 0.0–0.1)
EOS%: 1.5 % (ref 0.0–7.0)
Eosinophils Absolute: 0.2 10*3/uL (ref 0.0–0.5)
HCT: 31.2 % — ABNORMAL LOW (ref 34.8–46.6)
HGB: 9.9 g/dL — ABNORMAL LOW (ref 11.6–15.9)
LYMPH%: 14.9 % (ref 14.0–49.7)
MCH: 29.2 pg (ref 25.1–34.0)
MCHC: 31.7 g/dL (ref 31.5–36.0)
MCV: 92 fL (ref 79.5–101.0)
MONO#: 1.1 10*3/uL — AB (ref 0.1–0.9)
MONO%: 8.7 % (ref 0.0–14.0)
NEUT%: 74.6 % (ref 38.4–76.8)
NEUTROS ABS: 9.4 10*3/uL — AB (ref 1.5–6.5)
PLATELETS: 316 10*3/uL (ref 145–400)
RBC: 3.39 10*6/uL — AB (ref 3.70–5.45)
RDW: 15.5 % — ABNORMAL HIGH (ref 11.2–14.5)
WBC: 12.7 10*3/uL — AB (ref 3.9–10.3)
lymph#: 1.9 10*3/uL (ref 0.9–3.3)

## 2016-04-21 NOTE — Patient Instructions (Signed)
Constipation: Start Miralax again --take at bedtime (can take more than once/day)/ Continue the Colace in morning Check BP at home and if consistently over 150--can resume BP pill (talk with PCP) Dietician referral Stop the Vytorin OK to take full bath now Continue ferrous sulfate twice daily Your GIST tumor was KIT +--will respond to the [place]. Will stay on [address] indefinitely--

## 2016-04-21 NOTE — Telephone Encounter (Signed)
GAVE PATIENT AVS REPORT AND APPOINTMENTS FOR November.  °

## 2016-04-21 NOTE — Progress Notes (Signed)
Oncology Nurse Navigator Documentation  Oncology Nurse Navigator Flowsheets 04/21/2016  Navigator Location [facility]  Referral date to RadOnc/MedOnc 04/10/2016  Navigator Encounter Type Initial MedOnc  Abnormal Finding Date 04/08/2016  Confirmed Diagnosis Date 04/09/2016  Treatment Initiated Date 04/21/2016  Patient Visit Type MedOnc;Follow-up  Treatment Phase Pre-Tx/Tx Discussion  Barriers/Navigation Needs Education;Coordination of Care  Education Understanding Cancer/ Treatment Options;Preparing for Upcoming  Treatment;Newly Diagnosed Cancer Education  Interventions Referrals;Education  Referrals Nutrition/dietician  Education Method Verbal;Written  Support Groups/Services GI Support Group;Other--[facility] Support Services  Acuity Level 1  Time Spent with Patient 30  Met with patient, husband, daughter and son-in-law during new patient visit. Explained the role of the GI Nurse Navigator and provided New Patient Packet with information on: 1. GIST cancer 2. Support groups 3. Advanced Directives 4. Fall Safety Plan Answered questions, reviewed current treatment plan using TEACH back and provided emotional support. Provided copy of current treatment plan on discharge instructions. Mrs. Ocanas will work on adding Miralax back to her regimen. She will continue the colace as well. Informed her that some people can have loose stools with Gleevec, which may resolve her constipation issue. She has navigator contact information if she has any questions. Understands to call triage or collaborative nurse for symptom management issues.   Susan Coward, RN, BSN GI Oncology Navigator  Cancer Center 

## 2016-04-22 ENCOUNTER — Encounter: Payer: Medicare Other | Admitting: Nutrition

## 2016-04-22 ENCOUNTER — Encounter (HOSPITAL_COMMUNITY): Payer: Self-pay

## 2016-04-24 ENCOUNTER — Other Ambulatory Visit: Payer: Self-pay | Admitting: Hematology

## 2016-04-24 ENCOUNTER — Telehealth: Payer: Self-pay | Admitting: Pharmacist

## 2016-04-24 DIAGNOSIS — C49A2 Gastrointestinal stromal tumor of stomach: Secondary | ICD-10-CM

## 2016-04-24 NOTE — Telephone Encounter (Addendum)
Oral Chemotherapy Pharmacist Encounter - Imatinib   Ms. Haley Mcmillan is a 82 yo female with CD117 positive GIST of stomach diagnosed 04/09/16 who is starting new imatinib therapy. On 11/9, we discussed the following related to her new medication:   - Start date: 04/22/2016 - Adherence and administration: Patient stated she has been taking her imatinib with a few of her other medications with her 10:00 AM meal with a large glass of water. Recommended patient to continue this administration.  - Adverse effects: discussed adverse including but not limited to nausea, diarrhea, fatigue, edema, and rash. Ms. Haley Mcmillan was having some constipation, likely related to her pain medication, which resolved with prune juice and docusate. She had not noticed any other adverse effects from her first few days of the medication. She did state she was having a "weird feeling" like pins and needles in one of her legs a couple of days prior to starting the imatinib. This did not appear to be an adverse reaction with any of her mediations on her Epic med list, so recommended she follow up with her PCP about this.  - Drug interactions: No interactions with imatinib noted with current Epic medication list. Recommended to notify provider or pharmacist before starting any new medications to ensure no drug interactions. Also discussed importance of avoiding grapefruit juice, and patient was already told not to eat foods high in citrus at this time.   Ms. Haley Mcmillan understands to call the oral chemotherapy pharmacist at [facility] if she has any other questions or concerns with her medication.   Maggie Shuda, PharmD Acute Care Pharmacy Resident  Pager: [phone] 04/27/2016    

## 2016-04-28 ENCOUNTER — Ambulatory Visit: Payer: Medicare Other | Admitting: Nutrition

## 2016-04-28 NOTE — Progress Notes (Signed)
Patient is an 82 year old female diagnosed with GIST and who  is a patient of Dr. Burr Medico.  Past medical history includes anemia.  Medications include vitamin D 3, Colace, ferrous sulfate, Gleevec, Zofran, Protonix, and Senokot S  Labs include albumin 2.7.  Height: 66.5 inches. Weight: 154.2 pounds. Usual body weight: 166 pounds September 2017. BMI: 24.52  Patient reports poor appetite. She also has constipation but has recently started drinking prune juice with good results. Is considering asking M.D. for appetite stimulant. Cannot tell me foods high in calories or protein. Dietary recall reveals patient consumes toast and grits with butter, and cheese at breakfast, 2 ounces chopped steak with creamed potatoes and gravy and lima beans for lunch and a fruit cup and prune juice for dinner.  Nutrition diagnosis: Food and nutrition related knowledge deficit related to new diagnosis of GIST as evidenced by no prior need for nutrition related information.  Intervention: Reviewed foods high in calories and protein and provided examples for meals and snacks. Recommended patient consume 3 meals daily with small snacks in between meals. Patient should continue to consume Ensure Plus or equivalent for additional calories and protein. Fact sheets were given and contact information was provided.  Questions were answered.  Teach back method was used.  Monitoring, evaluation, goals:  Patient will tolerate increased calories and protein to promote weight maintenance/weight gain.  Next visit: Patient will contact me for questions or concerns.  **Disclaimer: This note was dictated with voice recognition software. Similar sounding words can inadvertently be transcribed and this note may contain transcription errors which may not have been corrected upon publication of note.**

## 2016-05-05 ENCOUNTER — Telehealth: Payer: Self-pay | Admitting: *Deleted

## 2016-05-05 ENCOUNTER — Other Ambulatory Visit (INDEPENDENT_AMBULATORY_CARE_PROVIDER_SITE_OTHER): Payer: Medicare Other

## 2016-05-05 ENCOUNTER — Encounter: Payer: Self-pay | Admitting: Internal Medicine

## 2016-05-05 ENCOUNTER — Ambulatory Visit (INDEPENDENT_AMBULATORY_CARE_PROVIDER_SITE_OTHER): Payer: Medicare Other | Admitting: Internal Medicine

## 2016-05-05 VITALS — BP 112/58 | HR 68 | Temp 98.2°F | Ht 66.5 in | Wt 154.0 lb

## 2016-05-05 DIAGNOSIS — R200 Anesthesia of skin: Secondary | ICD-10-CM

## 2016-05-05 DIAGNOSIS — C49A2 Gastrointestinal stromal tumor of stomach: Secondary | ICD-10-CM

## 2016-05-05 DIAGNOSIS — I10 Essential (primary) hypertension: Secondary | ICD-10-CM | POA: Diagnosis not present

## 2016-05-05 DIAGNOSIS — R1013 Epigastric pain: Secondary | ICD-10-CM | POA: Diagnosis not present

## 2016-05-05 DIAGNOSIS — R202 Paresthesia of skin: Secondary | ICD-10-CM

## 2016-05-05 DIAGNOSIS — R002 Palpitations: Secondary | ICD-10-CM

## 2016-05-05 LAB — VITAMIN B12: Vitamin B-12: 691 pg/mL (ref 211–911)

## 2016-05-05 LAB — VITAMIN D 25 HYDROXY (VIT D DEFICIENCY, FRACTURES): VITD: 47.77 ng/mL (ref 30.00–100.00)

## 2016-05-05 LAB — FERRITIN: FERRITIN: 148.4 ng/mL (ref 10.0–291.0)

## 2016-05-05 MED ORDER — TRAMADOL HCL 50 MG PO TABS: 50.0000 mg | ORAL_TABLET | Freq: Four times a day (QID) | ORAL | 0 refills | Status: DC | PRN

## 2016-05-05 MED ORDER — DOCUSATE SODIUM 100 MG PO CAPS: 100.0000 mg | ORAL_CAPSULE | Freq: Every day | ORAL | 1 refills | Status: DC

## 2016-05-05 NOTE — Assessment & Plan Note (Signed)
Better now that her iron levels are improved. Her exercise tolerance is back without the palpitations.

## 2016-05-05 NOTE — Telephone Encounter (Signed)
Noted patient has appointment for dietician on 11/29 at 1:45, however she saw patient on 04/28/16. Left VM to inquire if she still needs to see the dietician on 05/14/16? Left direct # for patient to return call to navigator.

## 2016-05-05 NOTE — Progress Notes (Signed)
Pre visit review using our clinic review tool, if applicable. No additional management support is needed unless otherwise documented below in the visit note. 

## 2016-05-05 NOTE — Patient Instructions (Signed)
We are checking the labs today.  We have sent in the refill of the docusate and the tramadol.

## 2016-05-05 NOTE — Assessment & Plan Note (Signed)
BP at goal off meds likely due to the significant weight loss.

## 2016-05-05 NOTE — Assessment & Plan Note (Signed)
Refilled tramadol although she is not currently having pain she is out and wants it in case the pain comes back.

## 2016-05-05 NOTE — Assessment & Plan Note (Signed)
Checking ferritin, B12, vitamin D. Previous thyroid levels normal.

## 2016-05-05 NOTE — Progress Notes (Signed)
   Subjective:    Patient ID: Haley Mcmillan, female    DOB: 04-02-1934, 82 y.o.   MRN: [id number]  HPI The patient is an 82 YO female coming in for hospital follow up (diagnosed with stage 4 GIST stomach tumor, she is now taking chemo pill daily with minimal side effects). She is having more appetite lately which is good. She is up about 1-2 pounds since the hospital. No falls or balance problems. She is doing well with the chemo and maybe more tired. Sleeping more during the day and sleeping well at night too. Her stools are finally normalizing and now are not dark like they were before. She is going regularly in the morning. No fevers or chills. No nausea or vomiting. No cough or sore throat. No chest pains. Some numbness in her left ankle/foot on the lateral side in the last 1-2 months (before chemo started). Did start taking vitamin D per our advice since September. Still taking iron BID.   PMH, [facility], social history reviewed and updated.   Review of Systems  Constitutional: Positive for fatigue. Negative for activity change, appetite change, chills, fever and unexpected weight change.  HENT: Negative.   Eyes: Negative.   Respiratory: Negative for cough, shortness of breath and wheezing.   Cardiovascular: Negative for chest pain, palpitations and leg swelling.  Gastrointestinal: Negative for abdominal distention, abdominal pain, blood in stool, constipation, diarrhea, nausea and vomiting.  Musculoskeletal: Negative.   Skin: Negative.   Neurological: Positive for numbness.  Psychiatric/Behavioral: Negative.       Objective:   Physical Exam  Constitutional: She is oriented to person, place, and time. She appears well-developed and well-nourished.  HENT:  Head: Normocephalic and atraumatic.  Eyes: EOM are normal.  Neck: Normal range of motion.  Cardiovascular: Normal rate and regular rhythm.   Pulmonary/Chest: Effort normal and breath sounds normal. No respiratory distress. She has no  wheezes. She has no rales.  Abdominal: Soft. Bowel sounds are normal. She exhibits no distension. There is no tenderness. There is no rebound.  Musculoskeletal: She exhibits no edema.  Neurological: She is alert and oriented to person, place, and time. Coordination normal.  Some difference to sensation in the left lateral foot, no rash or skin changes, DP and PT pulses normal both feet, no callusing or skin breakdown.   Skin: Skin is warm and dry.   Vitals:   05/05/16 0934  BP: (!) 112/58  Pulse: 68  Temp: 98.2 F (36.8 C)  TempSrc: Oral  SpO2: 94%  Weight: 154 lb (69.9 kg)  Height: 5' 6.5" (1.689 m)      Assessment & Plan:

## 2016-05-05 NOTE — Assessment & Plan Note (Addendum)
Following with oncology and taking gleevec currently without many side effects. Is sleeping more and somewhat fatigued during the day.

## 2016-05-06 ENCOUNTER — Encounter: Payer: Medicare Other | Admitting: Gastroenterology

## 2016-05-06 NOTE — Addendum Note (Signed)
Encounter addended by: Riley Churches on: 05/06/2016  9:03 AM<BR>    Actions taken: Imaging Exam ended

## 2016-05-07 NOTE — Progress Notes (Signed)
[place]  Telephone:([phone]) [phone] [phone]  Clinic Follow up Note   Patient Care Team: Hoyt Koch, MD as PCP - General (Internal Medicine) [place], MD as Consulting Physician (Gastroenterology) Truitt Merle, MD as Consulting Physician (Hematology) 05/12/2016  CHIEF COMPLAIN: follow up metastatic gastric GIST   SUMMARY OF ONCOLOGIC HISTORY: Oncology History   Malignant gastrointestinal stromal tumor (GIST) of stomach ([place])   Staging form: Gastric Stromal Tumor - Gastric Gist, AJCC 7th Edition   - Clinical stage from 04/09/2016: Stage IV (T3, N0, M1, Mitotic rate: Low) - Signed by Truitt Merle, MD on 04/20/2016      Malignant gastrointestinal stromal tumor (GIST) of stomach ([place])   04/09/2016 Initial Diagnosis    Malignant gastrointestinal stromal tumor (GIST) of stomach ([place])      04/09/2016 Procedure    EGD showed a large fungating and infiltrative very deeply ulcerated and necrotic malignant appearing mass in the gastric fundus.      04/09/2016 Initial Biopsy    Gastric mass biopsy showed gastrointestinal stromal tumor, I feel mitosis and necrosis, intermediate to high-grade.      04/09/2016 Imaging    CT chest, abdomen and pelvis with contrast showed a large exophytic mass in the proximal lateral stomach measuring 10 x 7.4 cm, nodular lesions within omentum and multiple liver nodules are highly suspicious for metastatic disease.      04/09/2016 Miscellaneous    KIT mutation test showed exon 11 deletion,  Which predicts good response to TKI      04/14/2016 Procedure    Peritoneal mass biopsy showed gastrointestinal stromal tumor      04/22/2016 -  Chemotherapy    Gleevec 400 mg daily      History of present illness (04/10/2016):   Haley Coghill Cravenis a 82 y.o.femalewith medical history significantfor anemia, arthritis, colonic polyps, hyperlipidemia, hypertension presents to the emergency department after recent GI visit  with worsening abdominal pain. She states the pain is intermittent, started about 2 weeks ago, located in the epigastric area, no significant nausea or change of her bowel habits. Her appetite and energy level has decreased lately, although she is able to take care of herself. When the pain resolves, she remains to be active and do house cleaning etc. he has lost about 30 pounds in the past 10-12 months, and the most weight with loss in the past few months. A CT scan showed a large exophytic mass in proximal lateral stomach, measuring 10 x 7.4 cm, and nodular lesions in the omentum, and a multiple liver lesions suspicious for metastatic disease. She underwent EGD by Dr. Simona Huh yesterday, which showed a large gastric tumor in the fundus, biopsy was done, pathology results still pending.  CURRENT THERAPY: Gleevec '400mg'$  daily, started on 04/22/2016  INTERVAL HISTORY:04/09/2016 Haley Mcmillan returns in for follow-up.  She has been tolerating Gleevec very well for the past 3 weeks. Her abdominal pain has resolved, no nausea, abdominal bloating, or other noticeable side effects or symptoms. She overall feels much better,  With better appetite and energy level.  She does have mild bilateral ankle swelling, which could be related to [place]. No calf tenderness or leg pain.  REVIEW OF SYSTEMS:   Constitutional: Denies fevers, chills or abnormal weight loss, (+) mild fatigue, able to function well Eyes: Denies blurriness of vision Ears, nose, mouth, throat, and face: Denies mucositis or sore throat Respiratory: Denies cough, dyspnea or wheezes Cardiovascular: Denies palpitation, chest discomfort or lower extremity swelling  Gastrointestinal:  Denies nausea, heartburn or change in bowel habits Skin: Denies abnormal skin rashes Lymphatics: Denies new lymphadenopathy or easy bruising Neurological:Denies numbness, tingling or new weaknesses Behavioral/Psych: Mood is stable, no new changes  All other systems were  reviewed with the patient and are negative.  MEDICAL HISTORY:  Past Medical History:  Diagnosis Date  . Anemia   . Arthritis   . Colon polyps   . Olecranon bursitis    Elbow  . Other and unspecified hyperlipidemia   . Palpitations   . Unspecified essential hypertension     SURGICAL HISTORY: Past Surgical History:  Procedure Laterality Date  . COLONOSCOPY W/ POLYPECTOMY  01/2004  . CRYOTHERAPY     female-cryosurgery stated by pt  . ESOPHAGOGASTRODUODENOSCOPY N/A 04/09/2016   Procedure: ESOPHAGOGASTRODUODENOSCOPY (EGD);  Surgeon: Doran Stabler, MD;  Location: [facility] ENDOSCOPY;  Service: Endoscopy;  Laterality: N/A;  . POLYPECTOMY     from vocal surgery    I have reviewed the social history and family history with the patient and they are unchanged from previous note.  ALLERGIES:  has No Known Allergies.  MEDICATIONS:  Current Outpatient Prescriptions  Medication Sig Dispense Refill  . Cholecalciferol (VITAMIN D3) 5000 units CAPS Take 5,000 Units by mouth daily.     Marland Kitchen docusate sodium (COLACE) 100 MG capsule Take 1 capsule (100 mg total) by mouth daily. 90 capsule 1  . ferrous sulfate 325 (65 FE) MG tablet Take 1 tablet (325 mg total) by mouth 2 (two) times daily with a meal. (Patient taking differently: Take 325 mg by mouth 2 (two) times daily with a meal. Taking 1-2/d) 60 tablet 3  . imatinib (GLEEVEC) 400 MG tablet Take 1 tablet (400 mg total) by mouth daily. Take with meals and large glass of water.Caution:Chemotherapy. 30 tablet 2  . pantoprazole (PROTONIX) 40 MG tablet Take 1 tablet (40 mg total) by mouth daily. 30 tablet 3  . feeding supplement, ENSURE ENLIVE, (ENSURE ENLIVE) LIQD Take 237 mLs by mouth 3 (three) times daily between meals. (Patient not taking: Reported on 05/12/2016) 237 mL 12  . ondansetron (ZOFRAN) 4 MG tablet Take 1 tablet (4 mg total) by mouth every 6 (six) hours as needed for nausea. (Patient not taking: Reported on 05/12/2016) 20 tablet 0  .  senna-docusate (SENOKOT-S) 8.6-50 MG tablet Take 1 tablet by mouth at bedtime as needed for mild constipation. (Patient not taking: Reported on 05/12/2016) 30 tablet 0  . traMADol (ULTRAM) 50 MG tablet Take 1 tablet (50 mg total) by mouth every 6 (six) hours as needed. (Patient not taking: Reported on 05/12/2016) 45 tablet 0   No current facility-administered medications for this visit.     PHYSICAL EXAMINATION: ECOG PERFORMANCE STATUS: 1 - Symptomatic but completely ambulatory  Vitals:   05/12/16 1206  BP: (!) 122/48  Pulse: 76  Resp: 18  Temp: 98.3 F (36.8 C)   Filed Weights   05/12/16 1206  Weight: 156 lb (70.8 kg)    GENERAL:alert, no distress and comfortable SKIN: skin color, texture, turgor are normal, no rashes or significant lesions EYES: normal, Conjunctiva are pink and non-injected, sclera clear OROPHARYNX:no exudate, no erythema and lips, buccal mucosa, and tongue normal  NECK: supple, thyroid normal size, non-tender, without nodularity LYMPH:  no palpable lymphadenopathy in the cervical, axillary or inguinal LUNGS: clear to auscultation and percussion with normal breathing effort HEART: regular rate & rhythm and no murmurs and no lower extremity edema ABDOMEN:abdomen soft, non-tender and normal bowel sounds  Musculoskeletal:no cyanosis of digits and no clubbing  NEURO: alert & oriented x 3 with fluent speech, no focal motor/sensory deficits  LABORATORY DATA:  I have reviewed the data as listed CBC Latest Ref Rng & Units 05/12/2016 04/21/2016 04/11/2016  WBC 3.9 - 10.3 10e3/uL 5.4 12.7(H) 6.7  Hemoglobin 11.6 - 15.9 g/dL 9.5(L) 9.9(L) 9.0(L)  Hematocrit 34.8 - 46.6 % 31.0(L) 31.2(L) 29.6(L)  Platelets 145 - 400 10e3/uL 234 316 332     CMP Latest Ref Rng & Units 05/12/2016 04/21/2016 04/11/2016  Glucose 70 - 140 mg/dl 95 127 107(H)  BUN 7.0 - 26.0 mg/dL 9.9 14.1 10  Creatinine 0.6 - 1.1 mg/dL 1.1 1.0 0.98  Sodium 136 - 145 mEq/L 145 139 140  Potassium 3.5 -  5.1 mEq/L 4.3 4.0 3.9  Chloride 101 - 111 mmol/L - - 108  CO2 22 - 29 mEq/L '26 25 26  '$ Calcium 8.4 - 10.4 mg/dL 8.8 9.3 8.6(L)  Total Protein 6.4 - 8.3 g/dL 6.0(L) 6.9 -  Total Bilirubin 0.20 - 1.20 mg/dL <0.22 <0.22 -  Alkaline Phos 40 - 150 U/L 77 103 -  AST 5 - 34 U/L 17 19 -  ALT 0 - 55 U/L 7 10 -   PATHOLOGY REPORT  Diagnosis 04/09/2016 Stomach, biopsy - GASTROINTESTINAL STROMAL TUMOR (GIST) - SEE COMMENT. Microscopic Comment The biopsy fragments reveal a hypercellular spindle cell proliferation with cytologic atypia, a few mitoses and necrosis. 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 Immunohistochemical stains were performed revealing that the tumor cells are strongly positive for CD117 and CD34. They are negative for cytokeratin A18, cytokeratin AE1/AE3, desmin, smooth muscle actin, smooth muscle myosin and S100. Dr. Claudette Laws has reviewed the case and concurs with this interpretation. Dr. Annamaria Boots was paged on 04/11/16.   Diagnosis 04/14/2016 Peritoneum, biopsy, Anterior abdomen - GASTROINTESTINAL STROMAL TUMOR (GIST). - SEE COMMENT. Microscopic Comment The tumor is morphologically identical to the patient's previous biopsy ([id number]). Additional studies can be performed upon clinical request. (JBK:gt, 04/15/16)    RADIOGRAPHIC STUDIES: I have personally reviewed the radiological images as listed and agreed with the findings in the report. No results found.   CT chest w contrast 04/09/2016 IMPRESSION: 1. No definite findings of metastatic disease in the chest. 2. Solitary 3 mm peripheral right upper lobe pulmonary nodule, for which a follow-up chest CT is advised in 3 months. 3. Re- demonstration of large irregular exophytic proximal gastric mass most consistent with primary gastric carcinoma. 4. Re- demonstration of innumerable liver masses most consistent with liver metastases. 5. Re-  demonstration of multiple peritoneal masses in the left upper quadrant most consistent with peritoneal metastases. 6. Aortic atherosclerosis.  CT abdomen and pelvis with contrast 04/09/2016 IMPRESSION: 1. There is large exophytic mass in proximal lateral stomach measures at least 10 x 7.4 cm highly suspicious for malignant gastric tumor. 2. There are nodular lesions within omentum and just medial to the spleen highly suspicious for metastatic disease. 3. Multiple liver nodules are noted consistent with metastatic disease. 4. No hydronephrosis or hydroureter. 5. No small bowel obstruction  EGD 04/09/2016 Dr. Loletha Carrow  The esophagus was normal. Findings: A large, fungating and infiltrative, very deeply-ulcerated and necrotic, malignant-appearing mass with stigmata of recent bleeding was found in the gastric fundus. Biopsies were taken with a cold forceps for histology. The duodenal bulb was normal. - Normal esophagus. - Likely malignant gastric tumor in the gastric fundus. extensively biopsied. - Normal duodenal bulb.  ASSESSMENT & PLAN:  82 year old African-American female, with past medical history of hypertension, otherwise healthy and active, presented with progressive weight loss, fatigue, anorexia and intermittent epigastric pain.  1. Malignant gastrointestinal stromal tumor (GIST) of stomach, with liver and peritoneum metastasis -I previously reviewed her CT scan findings, gastric mass and peritoneal mass biopsy results, in details with patient and her family members -We will review the neck to history of GIST, and treatment options. -We previously discussed this is not curable disease, but is very treatable, with mediastinal survival 4-5 years - Her kit mutation showed excellent 11 deletion, which predicts better response to TKI,  I reviewed the results in detail with her - she has been tolerating Gleevec very well, no noticeable side effects,  She overall has felt better  since she started the [place],  Asymptomatic now. -Will repeat her scan in 2-3 months to evaluate her response   2. Anemia, iron deficiency -Her anemia has overall improved since she started iron pill. Her initial study will consistent with iron deficiency -Her hemoglobin is 9.5 today - She takes iron pill 1 tablet a day most time, tolerating well, I encouraged her to increase ferrous sulfate to 2 tablets a day. If she does not have good response to oral iron, will consider IV iron  3. HTN -Continue medication, follow up with primary care physician  4. Abdominal pain  -Secondary to her peritoneal metastasis - resolved since she started [place] -continue Gleevec tomorrow, 400 mg daily -Increase ferrous sulfate from 1 tablet to 2 tablets daily  - I'll see her back in 4 weeks for follow-up. Will order restaging scan on next visit   All questions were answered. The patient knows to call the clinic with any problems, questions or concerns. No barriers to learning was detected.  I spent 20 minutes counseling the patient face to face. The total time spent in the appointment was 25 minutes and more than 50% was on counseling and review of test results  This document serves as a record of services personally performed by Truitt Merle, MD. It was created on her behalf by Arlyce Harman, a trained medical scribe. The creation of this record is based on the scribe's personal observations and the provider's statements to them. This document has been checked and approved by the attending provider.     Truitt Merle, MD 05/12/16

## 2016-05-09 ENCOUNTER — Telehealth: Payer: Self-pay | Admitting: Internal Medicine

## 2016-05-09 NOTE — Telephone Encounter (Signed)
Haley Mcmillan called from [facility] wanting to have a order extend nursing visits for 2 more visits for next week. Verbal order at [phone]. Thank you!

## 2016-05-12 ENCOUNTER — Telehealth: Payer: Self-pay | Admitting: Hematology

## 2016-05-12 ENCOUNTER — Encounter: Payer: Self-pay | Admitting: Hematology

## 2016-05-12 ENCOUNTER — Ambulatory Visit (HOSPITAL_BASED_OUTPATIENT_CLINIC_OR_DEPARTMENT_OTHER): Payer: Medicare Other | Admitting: Hematology

## 2016-05-12 ENCOUNTER — Other Ambulatory Visit: Payer: Self-pay | Admitting: *Deleted

## 2016-05-12 ENCOUNTER — Telehealth: Payer: Self-pay | Admitting: *Deleted

## 2016-05-12 ENCOUNTER — Other Ambulatory Visit (HOSPITAL_BASED_OUTPATIENT_CLINIC_OR_DEPARTMENT_OTHER): Payer: Medicare Other

## 2016-05-12 VITALS — BP 122/48 | HR 76 | Temp 98.3°F | Resp 18 | Ht 66.5 in | Wt 156.0 lb

## 2016-05-12 DIAGNOSIS — C786 Secondary malignant neoplasm of retroperitoneum and peritoneum: Secondary | ICD-10-CM | POA: Diagnosis not present

## 2016-05-12 DIAGNOSIS — C787 Secondary malignant neoplasm of liver and intrahepatic bile duct: Secondary | ICD-10-CM | POA: Diagnosis not present

## 2016-05-12 DIAGNOSIS — Z23 Encounter for immunization: Secondary | ICD-10-CM

## 2016-05-12 DIAGNOSIS — I10 Essential (primary) hypertension: Secondary | ICD-10-CM | POA: Diagnosis not present

## 2016-05-12 DIAGNOSIS — D509 Iron deficiency anemia, unspecified: Secondary | ICD-10-CM

## 2016-05-12 DIAGNOSIS — C49A2 Gastrointestinal stromal tumor of stomach: Secondary | ICD-10-CM

## 2016-05-12 DIAGNOSIS — G893 Neoplasm related pain (acute) (chronic): Secondary | ICD-10-CM

## 2016-05-12 DIAGNOSIS — D500 Iron deficiency anemia secondary to blood loss (chronic): Secondary | ICD-10-CM | POA: Insufficient documentation

## 2016-05-12 LAB — CBC WITH DIFFERENTIAL/PLATELET
BASO%: 1.1 % (ref 0.0–2.0)
Basophils Absolute: 0.1 10*3/uL (ref 0.0–0.1)
EOS%: 10.4 % — AB (ref 0.0–7.0)
Eosinophils Absolute: 0.6 10*3/uL — ABNORMAL HIGH (ref 0.0–0.5)
HCT: 31 % — ABNORMAL LOW (ref 34.8–46.6)
HEMOGLOBIN: 9.5 g/dL — AB (ref 11.6–15.9)
LYMPH#: 1.8 10*3/uL (ref 0.9–3.3)
LYMPH%: 34.1 % (ref 14.0–49.7)
MCH: 28.7 pg (ref 25.1–34.0)
MCHC: 30.6 g/dL — ABNORMAL LOW (ref 31.5–36.0)
MCV: 93.7 fL (ref 79.5–101.0)
MONO#: 0.7 10*3/uL (ref 0.1–0.9)
MONO%: 12.2 % (ref 0.0–14.0)
NEUT#: 2.3 10*3/uL (ref 1.5–6.5)
NEUT%: 42.2 % (ref 38.4–76.8)
PLATELETS: 234 10*3/uL (ref 145–400)
RBC: 3.31 10*6/uL — ABNORMAL LOW (ref 3.70–5.45)
RDW: 17.9 % — AB (ref 11.2–14.5)
WBC: 5.4 10*3/uL (ref 3.9–10.3)

## 2016-05-12 LAB — COMPREHENSIVE METABOLIC PANEL
ALBUMIN: 2.5 g/dL — AB (ref 3.5–5.0)
ALK PHOS: 77 U/L (ref 40–150)
ALT: 7 U/L (ref 0–55)
ANION GAP: 7 meq/L (ref 3–11)
AST: 17 U/L (ref 5–34)
BUN: 9.9 mg/dL (ref 7.0–26.0)
CALCIUM: 8.8 mg/dL (ref 8.4–10.4)
CO2: 26 mEq/L (ref 22–29)
CREATININE: 1.1 mg/dL (ref 0.6–1.1)
Chloride: 112 mEq/L — ABNORMAL HIGH (ref 98–109)
EGFR: 56 mL/min/{1.73_m2} — ABNORMAL LOW (ref >90)
Glucose: 95 mg/dl (ref 70–140)
Potassium: 4.3 mEq/L (ref 3.5–5.1)
Sodium: 145 mEq/L (ref 136–145)
TOTAL PROTEIN: 6 g/dL — AB (ref 6.4–8.3)

## 2016-05-12 MED ORDER — INFLUENZA VAC SPLIT QUAD 0.5 ML IM SUSY
0.5000 mL | PREFILLED_SYRINGE | Freq: Once | INTRAMUSCULAR | Status: AC
  Administered 2016-05-12: 0.5 mL via INTRAMUSCULAR
  Filled 2016-05-12: qty 0.5

## 2016-05-12 NOTE — Telephone Encounter (Signed)
Oncology Nurse Navigator Documentation  Oncology Nurse Navigator Flowsheets 05/12/2016  Navigator Location [facility]  Referral date to RadOnc/MedOnc -  Navigator Encounter Type Telephone  Telephone Outgoing Call;Appt Confirmation/Clarification--called to confirm she still wants to see the dietician on 05/14/16. She says she is doing well and actually gained a couple pounds. Will cancel appointment.  Abnormal Finding Date -  Confirmed Diagnosis Date -  Treatment Initiated Date -  Patient Visit Type -  Treatment Phase Active Tx--Gleevec  Barriers/Navigation Needs -  Education -  Interventions -  Referrals -  Education Method -  Support Groups/Services -  Acuity -  Time Spent with Patient -

## 2016-05-12 NOTE — Telephone Encounter (Signed)
Appointments scheduled per 11/27 LOS. Patient given AVS report and calendars with future scheduled appointments. °

## 2016-05-14 ENCOUNTER — Encounter: Payer: Medicare Other | Admitting: Nutrition

## 2016-05-14 NOTE — Telephone Encounter (Signed)
Spoke to [place] and gave verbal order.

## 2016-05-20 MED FILL — IMATINIB MESYLATE 400 MG TA: 400 | 30 days supply | Qty: 30 | Fill #1

## 2016-05-21 ENCOUNTER — Telehealth: Payer: Self-pay | Admitting: Internal Medicine

## 2016-05-21 NOTE — Telephone Encounter (Signed)
PLEASE NOTE: All timestamps contained within this report are represented as [place] Standard Time. CONFIDENTIALTY NOTICE: This fax transmission is intended only for the addressee. It contains information that is legally privileged, confidential or otherwise protected from use or disclosure. If you are not the intended recipient, you are strictly prohibited from reviewing, disclosing, copying using or disseminating any of this information or taking any action in reliance on or regarding this information. If you have received this fax in error, please notify us immediately by telephone so that we can arrange for its return to [place]. Phone: [phone], Toll-Free: [phone], Fax: [phone] Page: 1 of 1 Call Id: [id number] [facility] - Client [place] Patient Name: Haley Mcmillan DOB: [birth year] Initial Comment Caller states she has swelling in her ankles. She props it up to have it swell down, and she wants to know how to deal with this a little better. She is taking medications for it, it keeps moving up her legs, and both feet are like this. Not painful, but painful to the touch. Nurse Assessment Nurse: Vallery Sa, RN, Cathy Date/Time (Eastern Time): 05/21/2016 9:54:29 AM Confirm and document reason for call. If symptomatic, describe symptoms. ---Joaquim Lai states she developed swelling in her ankles about a month ago that is now about halfway up her legs today. No severe breathing difficulty or chest pain. No pain. No fever. Alert and responsive. Does the patient have any new or worsening symptoms? ---Yes Will a triage be completed? ---Yes Related visit to physician within the last 2 weeks? ---No Does the PT have any chronic conditions? (i.e. diabetes, asthma, etc.) ---Yes List chronic conditions. ---Tumors in her stomach Is this a behavioral health or substance abuse call? ---No Guidelines Guideline Title Affirmed Question  Affirmed Notes Leg Swelling and Edema [1] MODERATE leg swelling (e.g., swelling extends up to knees) AND [2] new onset or worsening Final Disposition User See Physician within [place], RN, Cathy Comments Scheduled for appointment 05/22/16 at 8:30am with Wilfred Lacy. Referrals REFERRED TO PCP OFFICE Disagree/Comply: Comply

## 2016-05-22 ENCOUNTER — Ambulatory Visit (INDEPENDENT_AMBULATORY_CARE_PROVIDER_SITE_OTHER): Payer: Medicare Other | Admitting: Nurse Practitioner

## 2016-05-22 ENCOUNTER — Encounter: Payer: Self-pay | Admitting: Nurse Practitioner

## 2016-05-22 VITALS — BP 120/60 | HR 79 | Temp 97.6°F | Ht 66.5 in | Wt 157.0 lb

## 2016-05-22 DIAGNOSIS — R600 Localized edema: Secondary | ICD-10-CM | POA: Diagnosis not present

## 2016-05-22 NOTE — Progress Notes (Signed)
Subjective:  Patient ID: Haley Mcmillan, female    DOB: 28-Jun-1933  Age: 82 y.o. MRN: [id number]  CC: Joint Swelling (both ankles swollen for 3 wks. some discomfort. )   HPI  EDEMA: Haley Mcmillan presents with bilateral ankle edema x 4weeks. Improves with elevation and worsens with prolonged sitting or standing. She denies any SOB or pain with ambulation or PND or leg injury. No new medication. Currently taking Gleevac, and was told by oncologist that it might cause LE edema.  Outpatient Medications Prior to Visit  Medication Sig Dispense Refill  . Cholecalciferol (VITAMIN D3) 5000 units CAPS Take 5,000 Units by mouth daily.     Marland Kitchen docusate sodium (COLACE) 100 MG capsule Take 1 capsule (100 mg total) by mouth daily. 90 capsule 1  . feeding supplement, ENSURE ENLIVE, (ENSURE ENLIVE) LIQD Take 237 mLs by mouth 3 (three) times daily between meals. 237 mL 12  . ferrous sulfate 325 (65 FE) MG tablet Take 1 tablet (325 mg total) by mouth 2 (two) times daily with a meal. (Patient taking differently: Take 325 mg by mouth 2 (two) times daily with a meal. Taking 1-2/d) 60 tablet 3  . imatinib (GLEEVEC) 400 MG tablet Take 1 tablet (400 mg total) by mouth daily. Take with meals and large glass of water.Caution:Chemotherapy. 30 tablet 2  . ondansetron (ZOFRAN) 4 MG tablet Take 1 tablet (4 mg total) by mouth every 6 (six) hours as needed for nausea. 20 tablet 0  . pantoprazole (PROTONIX) 40 MG tablet Take 1 tablet (40 mg total) by mouth daily. 30 tablet 3  . senna-docusate (SENOKOT-S) 8.6-50 MG tablet Take 1 tablet by mouth at bedtime as needed for mild constipation. 30 tablet 0  . traMADol (ULTRAM) 50 MG tablet Take 1 tablet (50 mg total) by mouth every 6 (six) hours as needed. 45 tablet 0   No facility-administered medications prior to visit.     ROS See HPI  Objective:  BP 120/60   Pulse 79   Temp 97.6 F (36.4 C)   Ht 5' 6.5" (1.689 m)   Wt 157 lb (71.2 kg)   BMI 24.96 kg/m   BP  Readings from Last 3 Encounters:  05/22/16 120/60  05/12/16 (!) 122/48  05/05/16 (!) 112/58    Wt Readings from Last 3 Encounters:  05/22/16 157 lb (71.2 kg)  05/12/16 156 lb (70.8 kg)  05/05/16 154 lb (69.9 kg)    Physical Exam  Constitutional: She is oriented to person, place, and time. No distress.  Cardiovascular: Normal rate, regular rhythm and normal heart sounds.   Pulmonary/Chest: Effort normal and breath sounds normal.  Musculoskeletal: She exhibits edema.  Bilateral ankle edema, pitting.  Neurological: She is oriented to person, place, and time.  Skin: Skin is warm and dry. No rash noted. No erythema.  Vitals reviewed.   Lab Results  Component Value Date   WBC 5.4 05/12/2016   HGB 9.5 (L) 05/12/2016   HCT 31.0 (L) 05/12/2016   PLT 234 05/12/2016   GLUCOSE 95 05/12/2016   CHOL 160 04/24/2015   TRIG 108.0 04/24/2015   HDL 42.40 04/24/2015   LDLDIRECT 93.5 12/08/2012   LDLCALC 96 04/24/2015   ALT 7 05/12/2016   AST 17 05/12/2016   NA 145 05/12/2016   K 4.3 05/12/2016   CL 108 04/11/2016   CREATININE 1.1 05/12/2016   BUN 9.9 05/12/2016   CO2 26 05/12/2016   TSH 1.011 04/21/2016   INR 1.05 04/11/2016  HGBA1C 6.4 04/24/2015    Ct Biopsy  Result Date: 04/14/2016 INDICATION: 82 year old female with evidence of metastatic disease and needs a tissue diagnosis. Previous imaging demonstrated a gastric mass, multiple peritoneal lesions and liver lesions. EXAM: CT-GUIDED BIOPSY OF PERITONEAL MASS. MEDICATIONS: None. ANESTHESIA/SEDATION: Moderate (conscious) sedation was employed during this procedure. A total of Versed 1.5 mg and Fentanyl 75 mcg was administered intravenously. Moderate Sedation Time: 15 minutes. The patient's level of consciousness and vital signs were monitored continuously by radiology nursing throughout the procedure under my direct supervision. FLUOROSCOPY TIME:  None COMPLICATIONS: None immediate. PROCEDURE: Informed written consent was obtained  from the patient after a thorough discussion of the procedural risks, benefits and alternatives. All questions were addressed. A timeout was performed prior to the initiation of the procedure. Patient was placed supine in CT scanner. CT images through the abdomen were obtained. Two large masses in the anterior abdomen were identified. The mass at the midline was targeted. The anterior abdomen was prepped with chlorhexidine and a sterile field was created. Skin and soft tissues were anesthetized with 1% lidocaine. A 17 gauge coaxial needle was directed into the midline anterior abdominal peritoneal mass. Needle position was confirmed within the lesion. A total of 5 core biopsies were obtained with an 18 gauge device. Specimens placed in saline. 17 gauge needle was removed without complication. FINDINGS: Again noted are two large anterior abdominal peritoneal masses. Core biopsies obtained from the 5.1 cm midline mass. IMPRESSION: CT-guided core biopsies of a peritoneal mass. Electronically Signed   By: Markus Daft M.D.   On: 04/14/2016 09:58    Assessment & Plan:   Haley Mcmillan was seen today for joint swelling.  Diagnoses and all orders for this visit:  Bilateral leg edema   I am having Haley Mcmillan maintain her pantoprazole, Vitamin D3, feeding supplement (ENSURE ENLIVE), ferrous sulfate, ondansetron, senna-docusate, imatinib, traMADol, and docusate sodium.  No orders of the defined types were placed in this encounter.  patient declined lasix prescription.  Follow-up: Return if symptoms worsen or fail to improve.  Wilfred Lacy, NP

## 2016-05-22 NOTE — Progress Notes (Signed)
Pre visit review using our clinic review tool, if applicable. No additional management support is needed unless otherwise documented below in the visit note. 

## 2016-05-22 NOTE — Patient Instructions (Addendum)
Patient declined use of lasix at this time.  Wear compression stocking during the day and off at night.  Maintain low salt diet.  Elevate legs as much as possible when sitting.  Return to office if develops any signs of cellulitis (redness, increased warmth, leg pain, fever, pain with ambulation).  Notify oncologist about LE edema.

## 2016-06-10 ENCOUNTER — Ambulatory Visit: Payer: Medicare Other | Admitting: Hematology

## 2016-06-10 ENCOUNTER — Other Ambulatory Visit: Payer: Medicare Other

## 2016-06-18 MED FILL — IMATINIB MESYLATE 400 MG TA: 400 | 30 days supply | Qty: 30 | Fill #2

## 2016-06-19 NOTE — Progress Notes (Signed)
[facility]  Telephone:([phone]) [phone] Fax:(336) [phone]  Clinic Follow up Note   Patient Care Team: Hoyt Koch, MD as PCP - General (Internal Medicine) [place], MD as Consulting Physician (Gastroenterology) Truitt Merle, MD as Consulting Physician (Hematology) 06/23/2016  CHIEF COMPLAIN: follow up metastatic gastric GIST   SUMMARY OF ONCOLOGIC HISTORY: Oncology History   Malignant gastrointestinal stromal tumor (GIST) of stomach ([place])   Staging form: Gastric Stromal Tumor - Gastric Gist, AJCC 7th Edition   - Clinical stage from 04/09/2016: Stage IV (T3, N0, M1, Mitotic rate: Low) - Signed by Truitt Merle, MD on 04/20/2016      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      04/09/2016 Initial Biopsy    Gastric mass biopsy showed gastrointestinal stromal tumor, I feel mitosis and necrosis, intermediate to high-grade.      04/09/2016 Imaging    CT chest, abdomen and pelvis with contrast showed a large exophytic mass in the proximal lateral stomach measuring 10 x 7.4 cm, nodular lesions within omentum and multiple liver nodules are highly suspicious for metastatic disease.      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 She states the pain is intermittent, started about 2 weeks ago, located in the epigastric area, no significant nausea or change of her bowel habits. Her appetite and energy level has decreased lately, although she is able to take care of herself. When the pain resolves, she remains to be active and do house cleaning etc. he has lost about 30 pounds in the past 10-12 months, and the most weight with loss in the past few months. A CT scan showed a large exophytic mass in proximal lateral stomach, measuring 10 x 7.4 cm, and nodular lesions in the omentum, and a multiple liver lesions suspicious for metastatic disease. She underwent EGD by Dr. Simona Huh yesterday, which showed a large gastric tumor in the fundus, biopsy was done, pathology results still pending.  CURRENT THERAPY: Gleevec '400mg'$  daily, started on 04/22/2016  INTERVAL HISTORY:04/09/2016 Haley Mcmillan returns in for follow-up. The patient feels great, energy appetite Her main complaint regards appetite. She reports feeling hungry, but then loses her appetite when she starts eating. She only eats a few spoonfuls of food. She has lost 10 lbs since 05/22/16. Reports poor taste when she takes the [place] before meals. She drinks an Ensure in the afternoon. She sleeps well. She feels depressed around this time of year since her son was killed on January 1st, 25-26 years ago. She is also depressed since one of her bowling friends passed away recently. She denies nausea, abdominal bloating, pain, constipation, or other noticeable side effects or symptoms.  REVIEW OF SYSTEMS:   Constitutional: Denies  fevers, chills (+) mild fatigue, able to function well (+) weight loss Eyes: Denies blurriness of vision Ears, nose, mouth, throat, and face: Denies mucositis or sore throat Respiratory: Denies cough, dyspnea or wheezes Cardiovascular: Denies palpitation, chest discomfort or lower extremity swelling Gastrointestinal:  Denies nausea,  heartburn or change in bowel habits Skin: Denies abnormal skin rashes Lymphatics: Denies new lymphadenopathy or easy bruising Neurological:Denies numbness, tingling or new weaknesses Behavioral/Psych: Mood is stable, no new changes  All other systems were reviewed with the patient and are negative.  MEDICAL HISTORY:  Past Medical History:  Diagnosis Date  . Anemia   . Arthritis   . Colon polyps   . Olecranon bursitis    Elbow  . Other and unspecified hyperlipidemia   . Palpitations   . Unspecified essential hypertension     SURGICAL HISTORY: Past Surgical History:  Procedure Laterality Date  . COLONOSCOPY W/ POLYPECTOMY  01/2004  . CRYOTHERAPY     female-cryosurgery stated by pt  . ESOPHAGOGASTRODUODENOSCOPY N/A 04/09/2016   Procedure: ESOPHAGOGASTRODUODENOSCOPY (EGD);  Surgeon: Doran Stabler, MD;  Location: [facility] ENDOSCOPY;  Service: Endoscopy;  Laterality: N/A;  . POLYPECTOMY     from vocal surgery    I have reviewed the social history and family history with the patient and they are unchanged from previous note.  ALLERGIES:  has No Known Allergies.  MEDICATIONS:  Current Outpatient Prescriptions  Medication Sig Dispense Refill  . Cholecalciferol (VITAMIN D3) 5000 units CAPS Take 5,000 Units by mouth daily.     Marland Kitchen docusate sodium (COLACE) 100 MG capsule Take 1 capsule (100 mg total) by mouth daily. 90 capsule 1  . ferrous sulfate 325 (65 FE) MG tablet Take 1 tablet (325 mg total) by mouth 2 (two) times daily with a meal. (Patient taking differently: Take 325 mg by mouth 2 (two) times daily with a meal. Taking 1-2/d) 60 tablet 3  . imatinib (GLEEVEC) 400 MG tablet Take 1 tablet (400 mg total) by mouth daily. Take with meals and large glass of water.Caution:Chemotherapy. 30 tablet 2  . feeding supplement, ENSURE ENLIVE, (ENSURE ENLIVE) LIQD Take 237 mLs by mouth 3 (three) times daily between meals. 237 mL 12  . ondansetron (ZOFRAN) 4 MG tablet Take 1 tablet (4 mg total) by  mouth every 6 (six) hours as needed for nausea. (Patient not taking: Reported on 06/23/2016) 20 tablet 0  . pantoprazole (PROTONIX) 40 MG tablet Take 1 tablet (40 mg total) by mouth daily. (Patient not taking: Reported on 06/23/2016) 30 tablet 3  . senna-docusate (SENOKOT-S) 8.6-50 MG tablet Take 1 tablet by mouth at bedtime as needed for mild constipation. (Patient not taking: Reported on 06/23/2016) 30 tablet 0  . traMADol (ULTRAM) 50 MG tablet Take 1 tablet (50 mg total) by mouth every 6 (six) hours as needed. (Patient not taking: Reported on 06/23/2016) 45 tablet 0   No current facility-administered medications for this visit.     PHYSICAL EXAMINATION: ECOG PERFORMANCE STATUS: 1 - Symptomatic but completely ambulatory  Vitals:   06/23/16 1410 06/23/16 1411  BP: (!) 157/62 137/67  Pulse: 78 92  Resp: 18 18  Temp: 98.5 F (36.9 C) 98.3 F (36.8 C)   Filed Weights   06/23/16 1410 06/23/16 1411  Weight: 148 lb (67.1 kg) 147 lb 4.8 oz (66.8 kg)    GENERAL:alert. (+) Teary during the encounter. SKIN: skin color, texture, turgor are normal, no rashes or significant lesions EYES: normal, Conjunctiva are pink and non-injected, sclera clear  OROPHARYNX:no exudate, no erythema and lips, buccal mucosa, and tongue normal  NECK: supple, thyroid normal size, non-tender, without nodularity LYMPH:  no palpable lymphadenopathy in the cervical, axillary or inguinal LUNGS: clear to auscultation and percussion with normal breathing effort HEART: regular rate & rhythm and no murmurs and no lower extremity edema ABDOMEN:abdomen soft, non-tender and normal bowel sounds Musculoskeletal:no cyanosis of digits and no clubbing  NEURO: alert & oriented x 3 with fluent speech, no focal motor/sensory deficits  LABORATORY DATA:  I have reviewed the data as listed CBC Latest Ref Rng & Units 06/23/2016 05/12/2016 04/21/2016  WBC 3.9 - 10.3 10e3/uL 5.8 5.4 12.7(H)  Hemoglobin 11.6 - 15.9 g/dL 11.6 9.5(L) 9.9(L)    Hematocrit 34.8 - 46.6 % 35.9 31.0(L) 31.2(L)  Platelets 145 - 400 10e3/uL 238 234 316     CMP Latest Ref Rng & Units 06/23/2016 05/12/2016 04/21/2016  Glucose 70 - 140 mg/dl 85 95 127  BUN 7.0 - 26.0 mg/dL 10.2 9.9 14.1  Creatinine 0.6 - 1.1 mg/dL 1.1 1.1 1.0  Sodium 136 - 145 mEq/L 144 145 139  Potassium 3.5 - 5.1 mEq/L 4.1 4.3 4.0  Chloride 101 - 111 mmol/L - - -  CO2 22 - 29 mEq/L '22 26 25  '$ Calcium 8.4 - 10.4 mg/dL 8.9 8.8 9.3  Total Protein 6.4 - 8.3 g/dL 6.8 6.0(L) 6.9  Total Bilirubin 0.20 - 1.20 mg/dL 0.44 <0.22 <0.22  Alkaline Phos 40 - 150 U/L 82 77 103  AST 5 - 34 U/L '20 17 19  '$ ALT 0-55 U/L U/L '6 7 10   '$ PATHOLOGY REPORT  Diagnosis 04/09/2016 Stomach, biopsy - GASTROINTESTINAL STROMAL TUMOR (GIST) - SEE COMMENT. Microscopic Comment The biopsy fragments reveal a hypercellular spindle cell proliferation with cytologic atypia, a few mitoses and necrosis. 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 Immunohistochemical stains were performed revealing that the tumor cells are strongly positive for CD117 and CD34. They are negative for cytokeratin A18, cytokeratin AE1/AE3, desmin, smooth muscle actin, smooth muscle myosin and S100. Dr. Claudette Laws has reviewed the case and concurs with this interpretation. Dr. Annamaria Boots was paged on 04/11/16.   Diagnosis 04/14/2016 Peritoneum, biopsy, Anterior abdomen - GASTROINTESTINAL STROMAL TUMOR (GIST). - SEE COMMENT. Microscopic Comment The tumor is morphologically identical to the patient's previous biopsy ([id number]). Additional studies can be performed upon clinical request. (JBK:gt, 04/15/16)    RADIOGRAPHIC STUDIES: I have personally reviewed the radiological images as listed and agreed with the findings in the report. No results found.   CT chest w contrast 04/09/2016 IMPRESSION: 1. No definite findings of metastatic disease in the chest. 2. Solitary 3 mm  peripheral right upper lobe pulmonary nodule, for which a follow-up chest CT is advised in 3 months. 3. Re- demonstration of large irregular exophytic proximal gastric mass most consistent with primary gastric carcinoma. 4. Re- demonstration of innumerable liver masses most consistent with liver metastases. 5. Re- demonstration of multiple peritoneal masses in the left upper quadrant most consistent with peritoneal metastases. 6. Aortic atherosclerosis.  CT abdomen and pelvis with contrast 04/09/2016 IMPRESSION: 1. There is large exophytic mass in proximal lateral stomach measures at least 10 x 7.4 cm highly suspicious for malignant gastric tumor. 2. There are nodular lesions within omentum and just medial to the spleen highly suspicious for metastatic disease. 3. Multiple liver nodules are noted consistent with metastatic disease. 4. No hydronephrosis or hydroureter. 5. No small bowel obstruction  EGD 04/09/2016 Dr. Loletha Carrow  The esophagus was normal. Findings: A large, fungating and infiltrative, very deeply-ulcerated and necrotic, malignant-appearing mass with stigmata of recent bleeding was found in the gastric fundus. Biopsies were taken with a cold forceps for histology. The duodenal bulb was normal. - Normal esophagus. - Likely malignant gastric tumor in the gastric fundus. extensively biopsied. - Normal duodenal bulb.   ASSESSMENT & PLAN:  83 y.o. African-American female, with past medical history of hypertension, otherwise healthy and active, presented with progressive weight loss, fatigue, anorexia and intermittent epigastric pain.  1. Malignant gastrointestinal stromal tumor (GIST) of stomach, with liver and peritoneum metastasis -I previously reviewed her CT scan findings, gastric mass and peritoneal mass biopsy results, in details with patient and her family members -We previously reviewed the neck to history of GIST, and treatment options. 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 -Repeat CT scan in 3 weeks with lab.  2. Anemia, iron deficiency -Her anemia has overall improved since she started iron pill. Her initial study will consistent with iron deficiency -Her hemoglobin is 11.6 TODAY, much improved  -She takes iron pill 1 tablet a day most time, tolerating well. I previously encouraged her to increase ferrous sulfate to 2 tablets a day. If she does not have good response to oral iron, will consider IV iron.  3. HTN -Continue medication, follow up with primary care physician  4. Abdominal pain  -Secondary to her peritoneal metastasis - resolved since she started Gleevec -The patient is no longer taking Tramadol.  5. Poor appetite and weight loss  -possible related to [place] vs her underline malignancy  -Encouraged the patient to eat high calorie and high protein food. -We discussed medication to stimulate the patient's appetite, she does not want it for now  -The patient states she will try high protein and caroli diet, and nutritional supplement   Plan -continue Gleevec 400 mg daily, I refilled today. -Continue ferrous sulfate 2 tablets daily -CT scan and labs in 3 weeks. - I'll see her back in 4 weeks for follow-up.  All questions were answered. The patient knows to call the clinic with any problems, questions or concerns. No barriers to learning was detected.  I spent 20 minutes counseling the patient face to face. The total time spent in the appointment was 25 minutes and more than 50% was on counseling and review of test results     Truitt Merle,  MD 06/23/16   This document serves as a record of services personally performed by Truitt Merle, MD. It was created on her behalf by Darcus Austin, a trained medical scribe. The creation of this record is based on the scribe's personal observations and the provider's statements to them. This document has been checked and approved by the attending provider.

## 2016-06-23 ENCOUNTER — Other Ambulatory Visit (HOSPITAL_BASED_OUTPATIENT_CLINIC_OR_DEPARTMENT_OTHER): Payer: Medicare Other

## 2016-06-23 ENCOUNTER — Ambulatory Visit (HOSPITAL_BASED_OUTPATIENT_CLINIC_OR_DEPARTMENT_OTHER): Payer: Medicare Other | Admitting: Hematology

## 2016-06-23 ENCOUNTER — Encounter: Payer: Self-pay | Admitting: Hematology

## 2016-06-23 ENCOUNTER — Telehealth: Payer: Self-pay | Admitting: Hematology

## 2016-06-23 VITALS — BP 137/67 | HR 92 | Temp 98.3°F | Resp 18 | Wt 147.3 lb

## 2016-06-23 DIAGNOSIS — C787 Secondary malignant neoplasm of liver and intrahepatic bile duct: Secondary | ICD-10-CM | POA: Diagnosis not present

## 2016-06-23 DIAGNOSIS — C49A2 Gastrointestinal stromal tumor of stomach: Secondary | ICD-10-CM

## 2016-06-23 DIAGNOSIS — D509 Iron deficiency anemia, unspecified: Secondary | ICD-10-CM

## 2016-06-23 DIAGNOSIS — R634 Abnormal weight loss: Secondary | ICD-10-CM

## 2016-06-23 DIAGNOSIS — D500 Iron deficiency anemia secondary to blood loss (chronic): Secondary | ICD-10-CM

## 2016-06-23 DIAGNOSIS — I10 Essential (primary) hypertension: Secondary | ICD-10-CM

## 2016-06-23 DIAGNOSIS — C786 Secondary malignant neoplasm of retroperitoneum and peritoneum: Secondary | ICD-10-CM

## 2016-06-23 DIAGNOSIS — G893 Neoplasm related pain (acute) (chronic): Secondary | ICD-10-CM

## 2016-06-23 DIAGNOSIS — R630 Anorexia: Secondary | ICD-10-CM

## 2016-06-23 LAB — COMPREHENSIVE METABOLIC PANEL
ALBUMIN: 3 g/dL — AB (ref 3.5–5.0)
ALK PHOS: 82 U/L (ref 40–150)
AST: 20 U/L (ref 5–34)
Anion Gap: 9 mEq/L (ref 3–11)
BILIRUBIN TOTAL: 0.44 mg/dL (ref 0.20–1.20)
BUN: 10.2 mg/dL (ref 7.0–26.0)
CO2: 22 meq/L (ref 22–29)
CREATININE: 1.1 mg/dL (ref 0.6–1.1)
Calcium: 8.9 mg/dL (ref 8.4–10.4)
Chloride: 113 mEq/L — ABNORMAL HIGH (ref 98–109)
EGFR: 54 mL/min/{1.73_m2} — ABNORMAL LOW (ref >90)
GLUCOSE: 85 mg/dL (ref 70–140)
Potassium: 4.1 mEq/L (ref 3.5–5.1)
SODIUM: 144 meq/L (ref 136–145)
TOTAL PROTEIN: 6.8 g/dL (ref 6.4–8.3)

## 2016-06-23 LAB — CBC WITH DIFFERENTIAL/PLATELET
BASO%: 1.9 % (ref 0.0–2.0)
Basophils Absolute: 0.1 10*3/uL (ref 0.0–0.1)
EOS ABS: 0.4 10*3/uL (ref 0.0–0.5)
EOS%: 6.1 % (ref 0.0–7.0)
HCT: 35.9 % (ref 34.8–46.6)
HEMOGLOBIN: 11.6 g/dL (ref 11.6–15.9)
LYMPH%: 33.4 % (ref 14.0–49.7)
MCH: 31.1 pg (ref 25.1–34.0)
MCHC: 32.4 g/dL (ref 31.5–36.0)
MCV: 95.8 fL (ref 79.5–101.0)
MONO#: 0.6 10*3/uL (ref 0.1–0.9)
MONO%: 9.5 % (ref 0.0–14.0)
NEUT%: 49.1 % (ref 38.4–76.8)
NEUTROS ABS: 2.9 10*3/uL (ref 1.5–6.5)
Platelets: 238 10*3/uL (ref 145–400)
RBC: 3.75 10*6/uL (ref 3.70–5.45)
RDW: 23.9 % — AB (ref 11.2–14.5)
WBC: 5.8 10*3/uL (ref 3.9–10.3)
lymph#: 1.9 10*3/uL (ref 0.9–3.3)

## 2016-06-23 LAB — IRON AND TIBC
%SAT: 25 % (ref 21–57)
Iron: 56 ug/dL (ref 41–142)
TIBC: 228 ug/dL — ABNORMAL LOW (ref 236–444)
UIBC: 171 ug/dL (ref 120–384)

## 2016-06-23 LAB — FERRITIN: Ferritin: 70 ng/ml (ref 9–269)

## 2016-06-23 MED ORDER — IMATINIB MESYLATE 400 MG PO TABS: 400.0000 mg | ORAL_TABLET | Freq: Every day | ORAL | 2 refills | Status: DC

## 2016-06-23 NOTE — Telephone Encounter (Signed)
Appointments scheduled per 1/8 LOS. Patient given AVS report and calendars with future scheduled appointments. Patient given two bottles of contrast and instructions for CT scan.

## 2016-07-15 NOTE — Progress Notes (Signed)
Nutrition:  Patient identified on Malnutrition Screening Tool for weight loss and poor appetite.  Last seen by nutrition on 04/28/2016.  Patient with GIST and patient of Dr. Burr Medico.  Spoke with patient via phone.  Patient reports appetite is poor and she feels weaker.  Reports she typically eats breakfast of oatmeal with canned milk or cereal. Sometimes she has a egg but not always. Patient reports she usually skips lunch or has a pack of peanut butter nabs.  For dinner has a sandwich or meat and vegetable or last nigh had sausage biscuit.  Reports that she enjoys fruit and has drank ensure plus about 1 per day but has stopped drinking it recently.    Patient reports normal bowel movement, 1 per day, no nausea or abdominal pain.    Medications: reviewed  Labs: reviewed  Anthropometrics:   Noted weight on 06/23/16 of 147 pounds decreased from visit on 11/13 of 154 pounds. 4% weight loss in the last 2 months   NUTRITION DIAGNOSIS: Food and nutrition related knowledge deficit continues as unable to name foods high in calories and protein to promote weight gain    INTERVENTION:  Discussed foods high in calories and protein and encouraged patient to eat q 2-3 hours.  Will mail fact sheet. Encouraged patient to consume ensure plus 2-3 times per day for additional calories and protein. Coupons mailed. Questions answered    MONITORING, EVALUATION, GOAL: Patient will tolerate increased calories and protein to promote weight gain   NEXT VISIT: Patient will contact RD for questions/concerns or future appointment.  Haley Mcmillan B. Haley Mcmillan, Haley Mcmillan, Haley Mcmillan (pager)

## 2016-07-16 ENCOUNTER — Ambulatory Visit (HOSPITAL_COMMUNITY)
Admission: RE | Admit: 2016-07-16 | Discharge: 2016-07-16 | Disposition: A | Discharge status: Home / Self Care | Payer: Medicare Other | Source: Ambulatory Visit | Attending: Hematology | Admitting: Hematology

## 2016-07-16 ENCOUNTER — Other Ambulatory Visit: Payer: Medicare Other

## 2016-07-16 ENCOUNTER — Other Ambulatory Visit (HOSPITAL_BASED_OUTPATIENT_CLINIC_OR_DEPARTMENT_OTHER): Payer: Medicare Other

## 2016-07-16 ENCOUNTER — Encounter (HOSPITAL_COMMUNITY): Payer: Self-pay

## 2016-07-16 DIAGNOSIS — C787 Secondary malignant neoplasm of liver and intrahepatic bile duct: Secondary | ICD-10-CM

## 2016-07-16 DIAGNOSIS — C786 Secondary malignant neoplasm of retroperitoneum and peritoneum: Secondary | ICD-10-CM | POA: Diagnosis not present

## 2016-07-16 DIAGNOSIS — R918 Other nonspecific abnormal finding of lung field: Secondary | ICD-10-CM | POA: Insufficient documentation

## 2016-07-16 DIAGNOSIS — C49A2 Gastrointestinal stromal tumor of stomach: Secondary | ICD-10-CM | POA: Insufficient documentation

## 2016-07-16 DIAGNOSIS — J432 Centrilobular emphysema: Secondary | ICD-10-CM | POA: Diagnosis not present

## 2016-07-16 DIAGNOSIS — I2510 Atherosclerotic heart disease of native coronary artery without angina pectoris: Secondary | ICD-10-CM | POA: Insufficient documentation

## 2016-07-16 DIAGNOSIS — I700 Atherosclerosis of aorta: Secondary | ICD-10-CM | POA: Insufficient documentation

## 2016-07-16 DIAGNOSIS — K769 Liver disease, unspecified: Secondary | ICD-10-CM | POA: Diagnosis not present

## 2016-07-16 LAB — CBC WITH DIFFERENTIAL/PLATELET
BASO%: 1.6 % (ref 0.0–2.0)
Basophils Absolute: 0.1 10*3/uL (ref 0.0–0.1)
EOS ABS: 0.2 10*3/uL (ref 0.0–0.5)
EOS%: 3.9 % (ref 0.0–7.0)
HCT: 39.6 % (ref 34.8–46.6)
HGB: 13 g/dL (ref 11.6–15.9)
LYMPH%: 35.8 % (ref 14.0–49.7)
MCH: 32.7 pg (ref 25.1–34.0)
MCHC: 32.9 g/dL (ref 31.5–36.0)
MCV: 99.6 fL (ref 79.5–101.0)
MONO#: 0.4 10*3/uL (ref 0.1–0.9)
MONO%: 7.9 % (ref 0.0–14.0)
NEUT#: 2.7 10*3/uL (ref 1.5–6.5)
NEUT%: 50.8 % (ref 38.4–76.8)
PLATELETS: 236 10*3/uL (ref 145–400)
RBC: 3.97 10*6/uL (ref 3.70–5.45)
RDW: 20.3 % — ABNORMAL HIGH (ref 11.2–14.5)
WBC: 5.4 10*3/uL (ref 3.9–10.3)
lymph#: 1.9 10*3/uL (ref 0.9–3.3)

## 2016-07-16 LAB — COMPREHENSIVE METABOLIC PANEL
ALT: <3 U/L (ref 0–55)
AST: 21 U/L (ref 5–34)
Albumin: 3.4 g/dL — ABNORMAL LOW (ref 3.5–5.0)
Alkaline Phosphatase: 88 U/L (ref 40–150)
Anion Gap: 8 mEq/L (ref 3–11)
BUN: 8.1 mg/dL (ref 7.0–26.0)
CHLORIDE: 111 meq/L — AB (ref 98–109)
CO2: 25 mEq/L (ref 22–29)
Calcium: 9.2 mg/dL (ref 8.4–10.4)
Creatinine: 1.2 mg/dL — ABNORMAL HIGH (ref 0.6–1.1)
EGFR: 52 mL/min/{1.73_m2} — AB (ref >90)
Glucose: 93 mg/dl (ref 70–140)
Potassium: 4.3 mEq/L (ref 3.5–5.1)
Sodium: 144 mEq/L (ref 136–145)
Total Bilirubin: 0.42 mg/dL (ref 0.20–1.20)
Total Protein: 7.4 g/dL (ref 6.4–8.3)

## 2016-07-16 MED ORDER — IOPAMIDOL (ISOVUE-300) INJECTION 61%
100.0000 mL | Freq: Once | INTRAVENOUS | Status: AC | PRN
Start: 2016-07-16 — End: 2016-07-16
  Administered 2016-07-16: 100 mL via INTRAVENOUS

## 2016-07-16 MED ORDER — SODIUM CHLORIDE 0.9 % IJ SOLN
INTRAMUSCULAR | Status: AC
  Filled 2016-07-16: qty 50

## 2016-07-16 MED ORDER — IOPAMIDOL (ISOVUE-370) INJECTION 76%
INTRAVENOUS | Status: AC
  Filled 2016-07-16: qty 100

## 2016-07-18 ENCOUNTER — Other Ambulatory Visit: Payer: Self-pay | Admitting: Hematology

## 2016-07-18 DIAGNOSIS — C49A2 Gastrointestinal stromal tumor of stomach: Secondary | ICD-10-CM

## 2016-07-18 MED FILL — IMATINIB MESYLATE 400 MG TA: 400 | 30 days supply | Qty: 30 | Fill #0

## 2016-07-18 NOTE — Progress Notes (Signed)
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      04/09/2016 Initial Biopsy    Gastric mass biopsy showed gastrointestinal stromal tumor, I feel mitosis and necrosis, intermediate to high-grade.      04/09/2016 Imaging    CT chest, abdomen and pelvis with contrast showed a large exophytic mass in the proximal lateral stomach measuring 10 x 7.4 cm, nodular lesions within omentum and multiple liver nodules are highly suspicious for metastatic disease.      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 Today's study demonstrates a positive response to therapy with regression of the primary gastric neoplasm, as well as regression of numerous intraperitoneal metastatic  lesions. Additionally, the liver lesions seen on the prior study appear far more well-defined, decreased in size and are much lower attenuation, suggesting internal areas of necrosis. 2. Several small pulmonary nodules are noted, some of which are new compared to the prior examination. These are nonspecific, but metastatic disease to the lungs is not excluded. Attention on follow-up imaging is recommended. 3. Importantly, there is also widespread ground-glass attenuation and septal thickening in the lungs bilaterally which is new compared to the prior study. In the appropriate clinical setting, this could reflect a drug reaction. Other differential considerations include atypical infection, or even developing interstitial lung disease. Clinical correlation is suggested. 4. Aortic atherosclerosis, in addition to right coronary artery disease. 5. Mild diffuse bronchial wall thickening with mild centrilobular and paraseptal emphysema; imaging findings suggestive of underlying COPD. 6. Additional incidental findings, as above.      History of present illness (04/10/2016):   Haley Mcmillan a 83 y.o.femalewith medical history significantfor anemia, arthritis, colonic polyps, hyperlipidemia, hypertension presents to the emergency department after recent GI visit with worsening abdominal pain. She states the pain is intermittent, started about 2 weeks ago, located in the epigastric area, no significant nausea or change of her bowel habits. Her appetite and energy level has decreased lately, although she is able to take care of herself. When the pain resolves, she remains to be active and do house cleaning etc. he has lost about 30 pounds in the past 10-12 months, and the most weight with loss in the past  few months. A CT scan showed a large exophytic mass in proximal lateral stomach, measuring 10 x 7.4 cm, and nodular lesions in the omentum, and a multiple liver lesions suspicious for  metastatic disease. She underwent EGD by Dr. Simona Huh yesterday, which showed a large gastric tumor in the fundus, biopsy was done, pathology results still pending.  CURRENT THERAPY: Gleevec '400mg'$  daily, started on 04/22/2016  INTERVAL HISTORY: Haley Mcmillan returns for follow-up and review of recent CT imaging. She feels the same and has been eating more because she noticed she was losing weight. Her appetite is not bad now. She has no concerns or complaints at this time. She has had a cough with phlegm production about twice daily (morning and night) but is not sure how long it has been. She reports shortness of breath with exertion, like going up the steps. She has noticed she becomes more tired and SOB when she takes the stairs slowly so she is able to take the stairs if she goes quickly. She denies SOB when walking her dog, however she does experience SOB [place]. She stopped smoking in 1996 and began smoking sometime when she was a teenager. She does not see a pulmonologist. On Thursday, she felt tired all day after having the CT scan. Her energy has improved every day since.   REVIEW OF SYSTEMS:   Constitutional: Denies fevers, chills (+) weight loss Eyes: Denies blurriness of vision Ears, nose, mouth, throat, and face: Denies mucositis or sore throat Respiratory: Denies dyspnea or wheezes (+) shortness of breath with exertion (+) cough twice daily with phlegm production Cardiovascular: Denies palpitation, chest discomfort or lower extremity swelling Gastrointestinal:  Denies nausea, heartburn or change in bowel habits Skin: Denies abnormal skin rashes Lymphatics: Denies new lymphadenopathy or easy bruising Neurological:Denies numbness, tingling or new weaknesses Behavioral/Psych: Mood is stable, no new changes  All other systems were reviewed with the patient and are negative.  MEDICAL HISTORY:  Past Medical History:  Diagnosis Date  . Anemia   . Arthritis     . Colon polyps   . Olecranon bursitis    Elbow  . Other and unspecified hyperlipidemia   . Palpitations   . Unspecified essential hypertension     SURGICAL HISTORY: Past Surgical History:  Procedure Laterality Date  . COLONOSCOPY W/ POLYPECTOMY  01/2004  . CRYOTHERAPY     female-cryosurgery stated by pt  . ESOPHAGOGASTRODUODENOSCOPY N/A 04/09/2016   Procedure: ESOPHAGOGASTRODUODENOSCOPY (EGD);  Surgeon: Doran Stabler, MD;  Location: [facility] ENDOSCOPY;  Service: Endoscopy;  Laterality: N/A;  . POLYPECTOMY     from vocal surgery    I have reviewed the social history and family history with the patient and they are unchanged from previous note.  She stopped smoking in 1996 and began smoking sometime when she was a teenager.  ALLERGIES:  has No Known Allergies.  MEDICATIONS:  Current Outpatient Prescriptions  Medication Sig Dispense Refill  . Cholecalciferol (VITAMIN D3) 5000 units CAPS Take 5,000 Units by mouth daily.     Marland Kitchen ENSURE PLUS (ENSURE PLUS) LIQD Take 1 Can by mouth 2 (two) times daily between meals.    . ferrous sulfate 325 (65 FE) MG tablet Take 1 tablet (325 mg total) by mouth 2 (two) times daily with a meal. (Patient taking differently: Take 325 mg by mouth 2 (two) times daily with a meal. Taking 1-2/d) 60 tablet 3  . imatinib (GLEEVEC) 400 MG tablet Take 1 tablet (  400 mg total) by mouth daily. Take with meals and large glass of water.Caution:Chemotherapy. 30 tablet 2  . docusate sodium (COLACE) 100 MG capsule Take 1 capsule (100 mg total) by mouth daily. (Patient not taking: Reported on 07/21/2016) 90 capsule 1  . ondansetron (ZOFRAN) 4 MG tablet Take 1 tablet (4 mg total) by mouth every 6 (six) hours as needed for nausea. (Patient not taking: Reported on 06/23/2016) 20 tablet 0  . pantoprazole (PROTONIX) 40 MG tablet Take 1 tablet (40 mg total) by mouth daily. (Patient not taking: Reported on 06/23/2016) 30 tablet 3  . senna-docusate (SENOKOT-S) 8.6-50 MG tablet Take 1  tablet by mouth at bedtime as needed for mild constipation. (Patient not taking: Reported on 06/23/2016) 30 tablet 0  . traMADol (ULTRAM) 50 MG tablet Take 1 tablet (50 mg total) by mouth every 6 (six) hours as needed. (Patient not taking: Reported on 06/23/2016) 45 tablet 0   No current facility-administered medications for this visit.     PHYSICAL EXAMINATION: ECOG PERFORMANCE STATUS: 1 - Symptomatic but completely ambulatory  Vitals:   07/21/16 1020  BP: (!) 158/76  Pulse: 83  Resp: 18  Temp: 98 F (36.7 C)   Filed Weights   07/21/16 1020  Weight: 144 lb 4.8 oz (65.5 kg)    GENERAL:alert. SKIN: skin color, texture, turgor are normal, no rashes or significant lesions EYES: normal, Conjunctiva are pink and non-injected, sclera clear OROPHARYNX:no exudate, no erythema and lips, buccal mucosa, and tongue normal  NECK: supple, thyroid normal size, non-tender, without nodularity LYMPH:  no palpable lymphadenopathy in the cervical, axillary or inguinal LUNGS: clear to auscultation and percussion with normal breathing effort HEART: regular rate & rhythm and no murmurs and no lower extremity edema ABDOMEN:abdomen soft, non-tender and normal bowel sounds Musculoskeletal:no cyanosis of digits and no clubbing  NEURO: alert & oriented x 3 with fluent speech, no focal motor/sensory deficits  LABORATORY DATA:  I have reviewed the data as listed CBC Latest Ref Rng & Units 07/16/2016 06/23/2016 05/12/2016  WBC 3.9 - 10.3 10e3/uL 5.4 5.8 5.4  Hemoglobin 11.6 - 15.9 g/dL 13.0 11.6 9.5(L)  Hematocrit 34.8 - 46.6 % 39.6 35.9 31.0(L)  Platelets 145 - 400 10e3/uL 236 238 234     CMP Latest Ref Rng & Units 07/16/2016 06/23/2016 05/12/2016  Glucose 70 - 140 mg/dl 93 85 95  BUN 7.0 - 26.0 mg/dL 8.1 10.2 9.9  Creatinine 0.6 - 1.1 mg/dL 1.2(H) 1.1 1.1  Sodium 136 - 145 mEq/L 144 144 145  Potassium 3.5 - 5.1 mEq/L 4.3 4.1 4.3  Chloride 101 - 111 mmol/L - - -  CO2 22 - 29 mEq/L '25 22 26  '$ Calcium 8.4 -  10.4 mg/dL 9.2 8.9 8.8  Total Protein 6.4 - 8.3 g/dL 7.4 6.8 6.0(L)  Total Bilirubin 0.20 - 1.20 mg/dL 0.42 0.44 <0.22  Alkaline Phos 40 - 150 U/L 88 82 77  AST 5 - 34 U/L '21 20 17  '$ ALT 0 - 55 U/L <3 <6 7   PATHOLOGY REPORT  Diagnosis 04/09/2016 Stomach, biopsy - GASTROINTESTINAL STROMAL TUMOR (GIST) - SEE COMMENT. Microscopic Comment The biopsy fragments reveal a hypercellular spindle cell proliferation with cytologic atypia, a few mitoses and necrosis. 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 Immunohistochemical stains were performed revealing that the tumor cells are strongly positive for CD117 and CD34. They are negative for cytokeratin A18, cytokeratin AE1/AE3, desmin, smooth muscle actin, smooth muscle myosin and  S100. Dr. Claudette Laws has reviewed the case and concurs with this interpretation. Dr. Annamaria Boots was paged on 04/11/16.   Diagnosis 04/14/2016 Peritoneum, biopsy, Anterior abdomen - GASTROINTESTINAL STROMAL TUMOR (GIST). - SEE COMMENT. Microscopic Comment The tumor is morphologically identical to the patient's previous biopsy ([id number]). Additional studies can be performed upon clinical request. (JBK:gt, 04/15/16)    RADIOGRAPHIC STUDIES: I have personally reviewed the radiological images as listed and agreed with the findings in the report. No results found.   CT chest w contrast 04/09/2016 IMPRESSION: 1. No definite findings of metastatic disease in the chest. 2. Solitary 3 mm peripheral right upper lobe pulmonary nodule, for which a follow-up chest CT is advised in 3 months. 3. Re- demonstration of large irregular exophytic proximal gastric mass most consistent with primary gastric carcinoma. 4. Re- demonstration of innumerable liver masses most consistent with liver metastases. 5. Re- demonstration of multiple peritoneal masses in the left upper quadrant most consistent with peritoneal  metastases. 6. Aortic atherosclerosis.  CT abdomen and pelvis with contrast 04/09/2016 IMPRESSION: 1. There is large exophytic mass in proximal lateral stomach measures at least 10 x 7.4 cm highly suspicious for malignant gastric tumor. 2. There are nodular lesions within omentum and just medial to the spleen highly suspicious for metastatic disease. 3. Multiple liver nodules are noted consistent with metastatic disease. 4. No hydronephrosis or hydroureter. 5. No small bowel obstruction  EGD 04/09/2016 Dr. Loletha Carrow  The esophagus was normal. Findings: A large, fungating and infiltrative, very deeply-ulcerated and necrotic, malignant-appearing mass with stigmata of recent bleeding was found in the gastric fundus. Biopsies were taken with a cold forceps for histology. The duodenal bulb was normal. - Normal esophagus. - Likely malignant gastric tumor in the gastric fundus. extensively biopsied. - Normal duodenal bulb.  CT Chest abdomen pelvis with contrast 07/16/2016 IMPRESSION: 1. Today's study demonstrates a positive response to therapy with regression of the primary gastric neoplasm, as well as regression of numerous intraperitoneal metastatic lesions. Additionally, the liver lesions seen on the prior study appear far more well-defined, decreased in size and are much lower attenuation, suggesting internal areas of necrosis. 2. Several small pulmonary nodules are noted, some of which are new compared to the prior examination. These are nonspecific, but metastatic disease to the lungs is not excluded. Attention on follow-up imaging is recommended. 3. Importantly, there is also widespread ground-glass attenuation and septal thickening in the lungs bilaterally which is new compared to the prior study. In the appropriate clinical setting, this could reflect a drug reaction. Other differential considerations include atypical infection, or even developing interstitial lung  disease. Clinical correlation is suggested. 4. Aortic atherosclerosis, in addition to right coronary artery disease. 5. Mild diffuse bronchial wall thickening with mild centrilobular and paraseptal emphysema; imaging findings suggestive of underlying COPD. 6. Additional incidental findings, as above.  ASSESSMENT & PLAN:  83 y.o. African-American female, with past medical history of hypertension, otherwise healthy and active, presented with progressive weight loss, fatigue, anorexia and intermittent epigastric pain.  1. Malignant gastrointestinal stromal tumor (GIST) of stomach, with liver and peritoneum metastasis -I previously reviewed her CT scan findings, gastric mass and peritoneal mass biopsy results, in details with patient and her family members -We previously reviewed the neck to history of GIST, and treatment options. -We previously discussed this is not curable disease, but is very treatable, with mediastinal survival 4-5 years -Her KIT mutation showed exon 11 deletion, which predicts better response to TKI,  I reviewed the results in  detail with her -She has been tolerating Gleevec very well, no noticeable side effects, except mild anorexia which could be related and occasional cough with phlegm production.  she overall has felt better since she started the [place]. -We reviewed her CT scan findings from 07/16/2016, this showed a positive response to therapy with regression of the primary gastric neoplasm, as well as regression of numerous intraperitoneal metastatic lesions. But several small pulmonary nodules were noted, some of which are new compared to the prior examination. Also seen, is widespread mild ground-glass attenuation and septal thickening in the lungs bilaterally which is new compared to the prior study. I'm not sure what the etiology of her pulmonary changes, COPD vs pneumonitis from [facility] is possible, she is clinically asymptomatic, I'll continue monitoring. -Continue  Gleevec 400 mg daily -She will return for follow up in 4 weeks with a chest x-ray. At this next visit, we will decide when to schedule repeat CT scans.  2. Anemia, iron deficiency -Her anemia has overall improved since she started iron pill. Her initial study will consistent with iron deficiency -Her hemoglobin was 13.0 on 07/16/16, much improved  -She takes iron pill 1 tablet a day most time, tolerating well. I previously encouraged her to increase ferrous sulfate to 2 tablets a day. If she does not have good response to oral iron, will consider IV iron.  3. HTN -Continue medication, follow up with primary care physician -She is scheduled to follow up with PCP on 08/05/16  4. Abdominal pain  -Secondary to her peritoneal metastasis - resolved since she started Gleevec -The patient is no longer taking Tramadol.  5. Poor appetite and weight loss  -possible related to [place] vs her underline malignancy  -Encouraged the patient to eat high calorie and high protein food. -We discussed medication to stimulate the patient's appetite, she does not want it for now  -The patient states she will try high protein and calorie diet, and nutritional supplement   6. Pulmonary nodules and groundglass change -Uncertain etiology. Atypical infection, COPD, vs Gleevec induced pneumonitis are on the differential. She does not have a clinical presentation of pneumonia.  -She is clinically asymptomatic, exam was unremarkable. -We'll monitor closely. Plan to see her back in 4 weeks with a repeat chest x-ray.  Plan -Reviewed CT imaging today. -continue Gleevec 400 mg daily, I refilled today. -Continue ferrous sulfate 2 tablets daily -She will call our clinic if she notices worsening shortness of breath or cough. - I'll see her back in 4 weeks for follow-up with chest x-ray. At this next visit, we will decide when to schedule repeat CT scans.  All questions were answered. The patient knows to call the clinic  with any problems, questions or concerns. No barriers to learning was detected.  I spent 25 minutes counseling the patient face to face. The total time spent in the appointment was 30 minutes and more than 50% was on counseling and review of test results  This document serves as a record of services personally performed by Truitt Merle, MD. It was created on her behalf by Arlyce Harman, a trained medical scribe. The creation of this record is based on the scribe's personal observations and the provider's statements to them. This document has been checked and approved by the attending provider.     Truitt Merle, MD 07/21/16

## 2016-07-21 ENCOUNTER — Telehealth: Payer: Self-pay | Admitting: Hematology

## 2016-07-21 ENCOUNTER — Encounter: Payer: Self-pay | Admitting: Hematology

## 2016-07-21 ENCOUNTER — Ambulatory Visit (HOSPITAL_BASED_OUTPATIENT_CLINIC_OR_DEPARTMENT_OTHER): Payer: Medicare Other | Admitting: Hematology

## 2016-07-21 VITALS — BP 158/76 | HR 83 | Temp 98.0°F | Resp 18 | Ht 66.5 in | Wt 144.3 lb

## 2016-07-21 DIAGNOSIS — I10 Essential (primary) hypertension: Secondary | ICD-10-CM

## 2016-07-21 DIAGNOSIS — D500 Iron deficiency anemia secondary to blood loss (chronic): Secondary | ICD-10-CM

## 2016-07-21 DIAGNOSIS — D509 Iron deficiency anemia, unspecified: Secondary | ICD-10-CM | POA: Diagnosis not present

## 2016-07-21 DIAGNOSIS — G893 Neoplasm related pain (acute) (chronic): Secondary | ICD-10-CM | POA: Diagnosis not present

## 2016-07-21 DIAGNOSIS — R918 Other nonspecific abnormal finding of lung field: Secondary | ICD-10-CM

## 2016-07-21 DIAGNOSIS — C787 Secondary malignant neoplasm of liver and intrahepatic bile duct: Secondary | ICD-10-CM

## 2016-07-21 DIAGNOSIS — C786 Secondary malignant neoplasm of retroperitoneum and peritoneum: Secondary | ICD-10-CM | POA: Diagnosis not present

## 2016-07-21 DIAGNOSIS — C49A2 Gastrointestinal stromal tumor of stomach: Secondary | ICD-10-CM | POA: Diagnosis not present

## 2016-07-21 NOTE — Telephone Encounter (Signed)
Appointments scheduled per 2/5 LOS. Patient given AVS report and calendars with future scheduled appointments. °

## 2016-08-05 ENCOUNTER — Ambulatory Visit (INDEPENDENT_AMBULATORY_CARE_PROVIDER_SITE_OTHER): Payer: Medicare Other | Admitting: Internal Medicine

## 2016-08-05 ENCOUNTER — Encounter: Payer: Self-pay | Admitting: Internal Medicine

## 2016-08-05 DIAGNOSIS — D500 Iron deficiency anemia secondary to blood loss (chronic): Secondary | ICD-10-CM

## 2016-08-05 NOTE — Assessment & Plan Note (Signed)
Hg is 13 at last check and MCV is not low. She likely has been repleted and can now switch to daily only iron pills. She would like to consult with oncologist about this and that is fine.

## 2016-08-05 NOTE — Patient Instructions (Addendum)
We do not need blood work today.   It is okay to start some mild exercise.   It is okay to take the iron pill just once a day.

## 2016-08-05 NOTE — Progress Notes (Signed)
Pre visit review using our clinic review tool, if applicable. No additional management support is needed unless otherwise documented below in the visit note. 

## 2016-08-05 NOTE — Progress Notes (Signed)
   Subjective:    Patient ID: Haley Mcmillan, female    DOB: Dec 01, 1933, 83 y.o.   MRN: [id number]  HPI The patient is an 83 YO female coming in for follow up of her iron deficiency anemia. She is sometimes forgetting to take her evening pill of iron but always takes once a day. She is worried about taking the iron pills with her gleevec in the evening. She tries to separate them by an hour but then sometimes forgets the iron. She is not having blood loss or dark stools. No abdominal pain or GERD.   Review of Systems  Constitutional: Positive for fatigue. Negative for activity change, appetite change, chills, fever and unexpected weight change.  Respiratory: Negative.   Cardiovascular: Negative.   Gastrointestinal: Negative for abdominal distention, abdominal pain, blood in stool, constipation, diarrhea, nausea and vomiting.  Musculoskeletal: Negative.   Neurological: Negative.       Objective:   Physical Exam  Constitutional: She is oriented to person, place, and time. She appears well-developed and well-nourished.  HENT:  Head: Normocephalic and atraumatic.  Eyes: EOM are normal.  Neck: Normal range of motion.  Cardiovascular: Normal rate and regular rhythm.   Pulmonary/Chest: Effort normal. No respiratory distress. She has no wheezes. She has no rales.  Abdominal: Soft. Bowel sounds are normal. She exhibits no distension. There is no tenderness. There is no rebound.  Neurological: She is alert and oriented to person, place, and time.  Skin: Skin is warm and dry.   Vitals:   08/05/16 0923  BP: 120/62  Pulse: 77  Temp: 98.2 F (36.8 C)  TempSrc: Oral  SpO2: 99%  Weight: 144 lb (65.3 kg)  Height: 5' 6.5" (1.689 m)      Assessment & Plan:

## 2016-08-18 MED FILL — IMATINIB MESYLATE 400 MG TA: 400 | 30 days supply | Qty: 30 | Fill #1

## 2016-08-19 NOTE — Progress Notes (Signed)
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      04/09/2016 Initial Biopsy    Gastric mass biopsy showed gastrointestinal stromal tumor, I feel mitosis and necrosis, intermediate to high-grade.      04/09/2016 Imaging    CT chest, abdomen and pelvis with contrast showed a large exophytic mass in the proximal lateral stomach measuring 10 x 7.4 cm, nodular lesions within omentum and multiple liver nodules are highly suspicious for metastatic disease.      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 Today's study demonstrates a positive response to therapy with regression of the primary gastric neoplasm, as well as regression of numerous intraperitoneal metastatic  lesions. Additionally, the liver lesions seen on the prior study appear far more well-defined, decreased in size and are much lower attenuation, suggesting internal areas of necrosis. 2. Several small pulmonary nodules are noted, some of which are new compared to the prior examination. These are nonspecific, but metastatic disease to the lungs is not excluded. Attention on follow-up imaging is recommended. 3. Importantly, there is also widespread ground-glass attenuation and septal thickening in the lungs bilaterally which is new compared to the prior study. In the appropriate clinical setting, this could reflect a drug reaction. Other differential considerations include atypical infection, or even developing interstitial lung disease. Clinical correlation is suggested. 4. Aortic atherosclerosis, in addition to right coronary artery disease. 5. Mild diffuse bronchial wall thickening with mild centrilobular and paraseptal emphysema; imaging findings suggestive of underlying COPD. 6. Additional incidental findings, as above.      History of present illness (04/10/2016):   Loanne Emery Cravenis a 83 y.o.femalewith medical history significantfor anemia, arthritis, colonic polyps, hyperlipidemia, hypertension presents to the emergency department after recent GI visit with worsening abdominal pain. She states the pain is intermittent, started about 2 weeks ago, located in the epigastric area, no significant nausea or change of her bowel habits. Her appetite and energy level has decreased lately, although she is able to take care of herself. When the pain resolves, she remains to be active and do house cleaning etc. he has lost about 30 pounds in the past 10-12 months, and the most weight with loss in the past  few months. A CT scan showed a large exophytic mass in proximal lateral stomach, measuring 10 x 7.4 cm, and nodular lesions in the omentum, and a multiple liver lesions suspicious for  metastatic disease. She underwent EGD by Dr. Simona Huh yesterday, which showed a large gastric tumor in the fundus, biopsy was done, pathology results still pending.  CURRENT THERAPY: Gleevec 81m daily, started on 04/22/2016  INTERVAL HISTORY: Mrs. CJuncajreturns for follow-up. She is doing well today. Her appetite has started to come back and she has been eating full meals. She still has some SOB, but only with exertion. Denies bowel problems, bloating, loss of appetite, weight loss, or any other concerns.   REVIEW OF SYSTEMS:   Constitutional: Denies fevers, chills  Eyes: Denies blurriness of vision Ears, nose, mouth, throat, and face: Denies mucositis or sore throat Respiratory: Denies dyspnea or wheezes (+) SOB with exertion Cardiovascular: Denies palpitation, chest discomfort or lower extremity swelling Gastrointestinal:  Denies nausea, heartburn or change in bowel habits Skin: Denies abnormal skin rashes Lymphatics: Denies new lymphadenopathy or easy bruising Neurological:Denies numbness, tingling or new weaknesses Behavioral/Psych: Mood is stable, no new changes  All other systems were reviewed with the patient and are negative.  MEDICAL HISTORY:  Past Medical History:  Diagnosis Date  . Anemia   . Arthritis   . Colon polyps   . Olecranon bursitis    Elbow  . Other and unspecified hyperlipidemia   . Palpitations   . Unspecified essential hypertension     SURGICAL HISTORY: Past Surgical History:  Procedure Laterality Date  . COLONOSCOPY W/ POLYPECTOMY  01/2004  . CRYOTHERAPY     female-cryosurgery stated by pt  . ESOPHAGOGASTRODUODENOSCOPY N/A 04/09/2016   Procedure: ESOPHAGOGASTRODUODENOSCOPY (EGD);  Surgeon: HDoran Stabler MD;  [facility];  Service: Endoscopy;  Laterality: N/A;  . POLYPECTOMY     from vocal surgery    I have reviewed the social history and family history with the patient and they are unchanged from previous note.  She stopped  smoking in 1996 and began smoking sometime when she was a teenager.  ALLERGIES:  has No Known Allergies.  MEDICATIONS:  Current Outpatient Prescriptions  Medication Sig Dispense Refill  . Cholecalciferol (VITAMIN D3) 5000 units CAPS Take 5,000 Units by mouth daily.     .Marland Kitchendocusate sodium (COLACE) 100 MG capsule Take 1 capsule (100 mg total) by mouth daily. 90 capsule 1  . ENSURE PLUS (ENSURE PLUS) LIQD Take 1 Can by mouth 2 (two) times daily between meals.    . ferrous sulfate 325 (65 FE) MG tablet Take 1 tablet (325 mg total) by mouth 2 (two) times daily with a meal. (Patient taking differently: Take 325 mg by mouth 2 (two) times daily with a meal. Taking 1-2/d) 60 tablet 3  . imatinib (GLEEVEC) 400 MG tablet Take 1 tablet (400 mg total) by mouth daily. Take with meals and large glass of water.Caution:Chemotherapy. 30 tablet 2   No current facility-administered medications for this visit.     PHYSICAL EXAMINATION: ECOG PERFORMANCE STATUS: 0  Vitals:   08/25/16 1059  BP: (!) 167/66  Pulse: 78  Resp: 18  Temp: 97.9 F (36.6 C)   Filed Weights   08/25/16 1059  Weight: 144 lb (65.3 kg)   GENERAL:alert. SKIN: skin color, texture, turgor are normal, no rashes or significant lesions EYES: normal, Conjunctiva are pink and non-injected, sclera clear OROPHARYNX:no exudate, no erythema and lips, buccal mucosa, and tongue normal  NECK:  supple, thyroid normal size, non-tender, without nodularity LYMPH:  no palpable lymphadenopathy in the cervical, axillary or inguinal LUNGS: clear to auscultation and percussion with normal breathing effort HEART: regular rate & rhythm and no murmurs and no lower extremity edema ABDOMEN:abdomen soft, non-tender and normal bowel sounds Musculoskeletal:no cyanosis of digits and no clubbing  NEURO: alert & oriented x 3 with fluent speech, no focal motor/sensory deficits  LABORATORY DATA:  I have reviewed the data as listed CBC Latest Ref Rng & Units  08/25/2016 07/16/2016 06/23/2016  WBC 3.9 - 10.3 10e3/uL 5.3 5.4 5.8  Hemoglobin 11.6 - 15.9 g/dL 14.0 13.0 11.6  Hematocrit 34.8 - 46.6 % 41.8 39.6 35.9  Platelets 145 - 400 10e3/uL 183 236 238     CMP Latest Ref Rng & Units 07/16/2016 06/23/2016 05/12/2016  Glucose 70 - 140 mg/dl 93 85 95  BUN 7.0 - 26.0 mg/dL 8.1 10.2 9.9  Creatinine 0.6 - 1.1 mg/dL 1.2(H) 1.1 1.1  Sodium 136 - 145 mEq/L 144 144 145  Potassium 3.5 - 5.1 mEq/L 4.3 4.1 4.3  Chloride 101 - 111 mmol/L - - -  CO2 22 - 29 mEq/L _0 Calcium 8.4 - 10.4 mg/dL 9.2 8.9 8.8  Total Protein 6.4 - 8.3 g/dL 7.4 6.8 6.0(L)  Total Bilirubin 0.20 - 1.20 mg/dL 0.42 0.44 <0.22  Alkaline Phos 40 - 150 U/L 88 82 77  AST 5 - 34 U/L _1 ALT 0 - 55 U/L <3 <6 7   PATHOLOGY REPORT  Diagnosis 04/09/2016 Stomach, biopsy - GASTROINTESTINAL STROMAL TUMOR (GIST) - SEE COMMENT. Microscopic Comment The biopsy fragments reveal a hypercellular spindle cell proliferation with cytologic atypia, a few mitoses and necrosis. 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 Immunohistochemical stains were performed revealing that the tumor cells are strongly positive for CD117 and CD34. They are negative for cytokeratin A18, cytokeratin AE1/AE3, desmin, smooth muscle actin, smooth muscle myosin and S100. Dr. Claudette Laws has reviewed the case and concurs with this interpretation. Dr. Annamaria Boots was paged on 04/11/16.   Diagnosis 04/14/2016 Peritoneum, biopsy, Anterior abdomen - GASTROINTESTINAL STROMAL TUMOR (GIST). - SEE COMMENT. Microscopic Comment The tumor is morphologically identical to the patient's previous biopsy ([id number]). Additional studies can be performed upon clinical request. (JBK:gt, 04/15/16)    RADIOGRAPHIC STUDIES: I have personally reviewed the radiological images as listed and agreed with the findings in the report. Dg Chest 2 View  Result Date:  08/25/2016 CLINICAL DATA:  Follow-up abnormal lung changes on prior CT EXAM: CHEST  2 VIEW COMPARISON:  CT 07/16/2016 FINDINGS: Heart is normal size. Previously seen small pulmonary nodules by CT not appreciable by plain film and should be followed with CT. Also, ground-glass opacities and interstitial thickening are not definitively seen. No effusions. No acute bony abnormality. IMPRESSION: No visible acute cardiopulmonary disease. Previously seen ground-glass opacities and small nodules would be better followed with chest CT. Electronically Signed   By: Rolm Baptise M.D.   On: 08/25/2016 10:28    CT chest w contrast 04/09/2016 IMPRESSION: 1. No definite findings of metastatic disease in the chest. 2. Solitary 3 mm peripheral right upper lobe pulmonary nodule, for which a follow-up chest CT is advised in 3 months. 3. Re- demonstration of large irregular exophytic proximal gastric mass most consistent with primary gastric carcinoma. 4. Re- demonstration of innumerable liver masses most consistent with liver metastases. 5. Re- demonstration of multiple peritoneal masses in the left  upper quadrant most consistent with peritoneal metastases. 6. Aortic atherosclerosis.  CT abdomen and pelvis with contrast 04/09/2016 IMPRESSION: 1. There is large exophytic mass in proximal lateral stomach measures at least 10 x 7.4 cm highly suspicious for malignant gastric tumor. 2. There are nodular lesions within omentum and just medial to the spleen highly suspicious for metastatic disease. 3. Multiple liver nodules are noted consistent with metastatic disease. 4. No hydronephrosis or hydroureter. 5. No small bowel obstruction  EGD 04/09/2016 Dr. Loletha Carrow  The esophagus was normal. Findings: A large, fungating and infiltrative, very deeply-ulcerated and necrotic, malignant-appearing mass with stigmata of recent bleeding was found in the gastric fundus. Biopsies were taken with a cold forceps  for histology. The duodenal bulb was normal. - Normal esophagus. - Likely malignant gastric tumor in the gastric fundus. extensively biopsied. - Normal duodenal bulb.  CT Chest abdomen pelvis with contrast 07/16/2016 IMPRESSION: 1. Today's study demonstrates a positive response to therapy with regression of the primary gastric neoplasm, as well as regression of numerous intraperitoneal metastatic lesions. Additionally, the liver lesions seen on the prior study appear far more well-defined, decreased in size and are much lower attenuation, suggesting internal areas of necrosis. 2. Several small pulmonary nodules are noted, some of which are new compared to the prior examination. These are nonspecific, but metastatic disease to the lungs is not excluded. Attention on follow-up imaging is recommended. 3. Importantly, there is also widespread ground-glass attenuation and septal thickening in the lungs bilaterally which is new compared to the prior study. In the appropriate clinical setting, this could reflect a drug reaction. Other differential considerations include atypical infection, or even developing interstitial lung disease. Clinical correlation is suggested. 4. Aortic atherosclerosis, in addition to right coronary artery disease. 5. Mild diffuse bronchial wall thickening with mild centrilobular and paraseptal emphysema; imaging findings suggestive of underlying COPD. 6. Additional incidental findings, as above.  ASSESSMENT & PLAN:  83 y.o. African-American female, with past medical history of hypertension, otherwise healthy and active, presented with progressive weight loss, fatigue, anorexia and intermittent epigastric pain.  1. Malignant gastrointestinal stromal tumor (GIST) of stomach, with liver and peritoneal metastasis -I previously reviewed her CT scan findings, gastric mass and peritoneal mass biopsy results, in details with patient and her family members -We  previously reviewed the neck to history of GIST, and treatment options. -We previously discussed this is not curable disease, but is very treatable, with mediastinal survival 4-5 years -Her KIT mutation showed exon 11 deletion, which predicts better response to TKI,  I reviewed the results in detail with her -She has been tolerating Gleevec very well, no noticeable side effects, except mild anorexia which could be related and occasional cough with phlegm production.  she overall has felt better since she started the [place]. -We previously reviewed her CT scan findings from 07/16/2016, this showed a positive response to therapy with regression of the primary gastric neoplasm, as well as regression of numerous intraperitoneal metastatic lesions. But several small pulmonary nodules were noted, some of which are new compared to the prior examination. Also seen, is widespread mild ground-glass attenuation and septal thickening in the lungs bilaterally which is new compared to the prior study. I'm not sure what the etiology of her pulmonary changes, COPD vs pneumonitis from [place] is possible, she is clinically asymptomatic, I'll continue monitoring. -I reviewed her chest x-ray today which is negative her pulmonary exam was unremarkable today. She is not symptomatic. -Continue Gleevec 400 mg daily, tolerating well. -Repeat  CT in mid April 2018.   2. Anemia, iron deficiency -Her anemia has overall improved since she started iron pill. Her initial study will consistent with iron deficiency -Her anemia has resolved after oral iron supplement, so she was reduced to 1 ferrous sulfate tablet a day.   3. HTN -Continue medication, follow up with primary care physician -She is scheduled to follow up with PCP on 08/05/16  4. Abdominal pain  -Secondary to her peritoneal metastasis - resolved since she started Gleevec -The patient is no longer taking Tramadol.  5. Poor appetite and weight loss  -possible  related to [place] vs her underline malignancy  -Encouraged the patient to eat high calorie and high protein food. -We previously discussed medication to stimulate the patient's appetite, she does not want it for now  -The patient states she will try high protein and calorie diet, and nutritional supplement   6. Pulmonary nodules and groundglass change -Uncertain etiology. Atypical infection, COPD, vs Gleevec induced pneumonitis are on the differential. She does not have a clinical presentation of pneumonia.  -She is clinically asymptomatic, exam was unremarkable. I discussed her chest x-ray findings today which was negative. -We'll monitor closely. Plan to see her back in 4 weeks with a repeat CT scan   Plan -continue Gleevec 400 mg daily, tolerating well.  -Continue ferrous sulfate 1 tablet daily - I'll see her back in 4-5 weeks for follow-up. CT CAP with contrast before this visit.    All questions were answered. The patient knows to call the clinic with any problems, questions or concerns. No barriers to learning was detected.  I spent 25 minutes counseling the patient face to face. The total time spent in the appointment was 30 minutes and more than 50% was on counseling and review of test results  This document serves as a record of services personally performed by Truitt Merle, MD. It was created on her behalf by Martinique Casey, a trained medical scribe. The creation of this record is based on the scribe's personal observations and the provider's statements to them. This document has been checked and approved by the attending provider.  I have reviewed the above documentation for accuracy and completeness and I agree with the above.   Truitt Merle, MD 08/25/16

## 2016-08-25 ENCOUNTER — Telehealth: Payer: Self-pay | Admitting: Hematology

## 2016-08-25 ENCOUNTER — Encounter: Payer: Self-pay | Admitting: Hematology

## 2016-08-25 ENCOUNTER — Other Ambulatory Visit (HOSPITAL_BASED_OUTPATIENT_CLINIC_OR_DEPARTMENT_OTHER): Payer: Medicare Other

## 2016-08-25 ENCOUNTER — Ambulatory Visit (HOSPITAL_BASED_OUTPATIENT_CLINIC_OR_DEPARTMENT_OTHER): Payer: Medicare Other | Admitting: Hematology

## 2016-08-25 ENCOUNTER — Ambulatory Visit (HOSPITAL_COMMUNITY)
Admission: RE | Admit: 2016-08-25 | Discharge: 2016-08-25 | Disposition: A | Discharge status: Home / Self Care | Payer: Medicare Other | Source: Ambulatory Visit | Attending: Hematology | Admitting: Hematology

## 2016-08-25 VITALS — BP 167/66 | HR 78 | Temp 97.9°F | Resp 18 | Ht 66.5 in | Wt 144.0 lb

## 2016-08-25 DIAGNOSIS — I10 Essential (primary) hypertension: Secondary | ICD-10-CM

## 2016-08-25 DIAGNOSIS — G893 Neoplasm related pain (acute) (chronic): Secondary | ICD-10-CM

## 2016-08-25 DIAGNOSIS — R634 Abnormal weight loss: Secondary | ICD-10-CM

## 2016-08-25 DIAGNOSIS — R630 Anorexia: Secondary | ICD-10-CM

## 2016-08-25 DIAGNOSIS — D500 Iron deficiency anemia secondary to blood loss (chronic): Secondary | ICD-10-CM

## 2016-08-25 DIAGNOSIS — D509 Iron deficiency anemia, unspecified: Secondary | ICD-10-CM

## 2016-08-25 DIAGNOSIS — C49A2 Gastrointestinal stromal tumor of stomach: Secondary | ICD-10-CM

## 2016-08-25 DIAGNOSIS — R918 Other nonspecific abnormal finding of lung field: Secondary | ICD-10-CM | POA: Diagnosis not present

## 2016-08-25 LAB — CBC WITH DIFFERENTIAL/PLATELET
BASO%: 1.2 % (ref 0.0–2.0)
Basophils Absolute: 0.1 10*3/uL (ref 0.0–0.1)
EOS ABS: 0.2 10*3/uL (ref 0.0–0.5)
EOS%: 4.4 % (ref 0.0–7.0)
HCT: 41.8 % (ref 34.8–46.6)
HGB: 14 g/dL (ref 11.6–15.9)
LYMPH%: 35.7 % (ref 14.0–49.7)
MCH: 35 pg — ABNORMAL HIGH (ref 25.1–34.0)
MCHC: 33.5 g/dL (ref 31.5–36.0)
MCV: 104.4 fL — AB (ref 79.5–101.0)
MONO#: 0.3 10*3/uL (ref 0.1–0.9)
MONO%: 6.4 % (ref 0.0–14.0)
NEUT#: 2.8 10*3/uL (ref 1.5–6.5)
NEUT%: 52.3 % (ref 38.4–76.8)
Platelets: 183 10*3/uL (ref 145–400)
RBC: 4 10*6/uL (ref 3.70–5.45)
RDW: 14.3 % (ref 11.2–14.5)
WBC: 5.3 10*3/uL (ref 3.9–10.3)
lymph#: 1.9 10*3/uL (ref 0.9–3.3)

## 2016-08-25 LAB — COMPREHENSIVE METABOLIC PANEL
ALT: <3 U/L (ref 0–55)
AST: 19 U/L (ref 5–34)
Albumin: 3.5 g/dL (ref 3.5–5.0)
Alkaline Phosphatase: 84 U/L (ref 40–150)
Anion Gap: 7 mEq/L (ref 3–11)
BILIRUBIN TOTAL: 0.32 mg/dL (ref 0.20–1.20)
BUN: 11.2 mg/dL (ref 7.0–26.0)
CO2: 27 meq/L (ref 22–29)
Calcium: 9.3 mg/dL (ref 8.4–10.4)
Chloride: 108 mEq/L (ref 98–109)
Creatinine: 1.1 mg/dL (ref 0.6–1.1)
EGFR: 53 mL/min/{1.73_m2} — AB (ref >90)
GLUCOSE: 108 mg/dL (ref 70–140)
Potassium: 4.1 mEq/L (ref 3.5–5.1)
SODIUM: 143 meq/L (ref 136–145)
Total Protein: 7.2 g/dL (ref 6.4–8.3)

## 2016-08-25 LAB — IRON AND TIBC
%SAT: 15 % — ABNORMAL LOW (ref 21–57)
Iron: 38 ug/dL — ABNORMAL LOW (ref 41–142)
TIBC: 254 ug/dL (ref 236–444)
UIBC: 216 ug/dL (ref 120–384)

## 2016-08-25 LAB — FERRITIN: Ferritin: 61 ng/ml (ref 9–269)

## 2016-08-25 NOTE — Telephone Encounter (Signed)
Appointments scheduled per 3/12 LOS. Patient given AVS report and calendars with future scheduled appointments. °

## 2016-09-15 ENCOUNTER — Other Ambulatory Visit: Payer: Self-pay | Admitting: Internal Medicine

## 2016-09-15 NOTE — Telephone Encounter (Signed)
Pt called to get a refill on this med  ferrous sulfate 325 (65 FE) MG tablet [[id number]]

## 2016-09-16 MED FILL — IMATINIB MESYLATE 400 MG TA: 400 | 30 days supply | Qty: 30 | Fill #2

## 2016-09-18 NOTE — Progress Notes (Signed)
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      04/09/2016 Initial Biopsy    Gastric mass biopsy showed gastrointestinal stromal tumor, I feel mitosis and necrosis, intermediate to high-grade.      04/09/2016 Imaging    CT chest, abdomen and pelvis with contrast showed a large exophytic mass in the proximal lateral stomach measuring 10 x 7.4 cm, nodular lesions within omentum and multiple liver nodules are highly suspicious for metastatic disease.      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 Today's study demonstrates a positive response to therapy with regression of the primary gastric neoplasm, as well as regression of numerous intraperitoneal metastatic  lesions. Additionally, the liver lesions seen on the prior study appear far more well-defined, decreased in size and are much lower attenuation, suggesting internal areas of necrosis. 2. Several small pulmonary nodules are noted, some of which are new compared to the prior examination. These are nonspecific, but metastatic disease to the lungs is not excluded. Attention on follow-up imaging is recommended. 3. Importantly, there is also widespread ground-glass attenuation and septal thickening in the lungs bilaterally which is new compared to the prior study. In the appropriate clinical setting, this could reflect a drug reaction. Other differential considerations include atypical infection, or even developing interstitial lung disease. Clinical correlation is suggested. 4. Aortic atherosclerosis, in addition to right coronary artery disease. 5. Mild diffuse bronchial wall thickening with mild centrilobular and paraseptal emphysema; imaging findings suggestive of underlying COPD. 6. Additional incidental findings, as above.      09/22/2016 Imaging     CT CAP W CONTRAST   IMPRESSION: 1. Nodular appearing GI stromal tumor along the posterior gastric wall with associated peritoneal implants and hepatic metastases, stable from 07/16/2016. 2. Improving mid and lower lung zone predominant peribronchovascular ground-glass, possibly related to drug therapy. 3. A few scattered pulmonary nodules are stable. 4. Aortic atherosclerosis (ICD10-170.0). Coronary artery calcification.      History of present illness (04/10/2016):   Haley Mcmillan a 83 y.o.femalewith medical history significantfor anemia, arthritis, colonic polyps, hyperlipidemia, hypertension presents to the emergency department after recent GI visit with worsening abdominal pain. She states the pain is intermittent, started  about 2 weeks ago, located in the epigastric area, no significant nausea or change of her bowel  habits. Her appetite and energy level has decreased lately, although she is able to take care of herself. When the pain resolves, she remains to be active and do house cleaning etc. he has lost about 30 pounds in the past 10-12 months, and the most weight with loss in the past few months. A CT scan showed a large exophytic mass in proximal lateral stomach, measuring 10 x 7.4 cm, and nodular lesions in the omentum, and a multiple liver lesions suspicious for metastatic disease. She underwent EGD by Dr. Dennis yesterday, which showed a large gastric tumor in the fundus, biopsy was done, pathology results still pending.  CURRENT THERAPY: Gleevec 400mg daily, started on 04/22/2016  INTERVAL HISTORY: [name] returns for follow-up. She says things are going fine and she is eating better now. She eats 3 meals a day with a smoothie.    She is doing well today. Her appetite has started to come back and she has been eating full meals. She still has some SOB, but only with exertion. Denies bowel problems, bloating, loss of appetite, weight loss, or any other concerns. Her energy level had improved. She denies any pain, or trouble with Gleevec.  She is very thankful to be doing well.   REVIEW OF SYSTEMS:   Constitutional: Denies fevers, chills  Eyes: Denies blurriness of vision Ears, nose, mouth, throat, and face: Denies mucositis or sore throat Respiratory: Denies dyspnea or wheezes  Cardiovascular: Denies palpitation, chest discomfort or lower extremity swelling Gastrointestinal:  Denies nausea, heartburn or change in bowel habits Skin: Denies abnormal skin rashes Lymphatics: Denies new lymphadenopathy or easy bruising Neurological:Denies numbness, tingling or new weaknesses Behavioral/Psych: Mood is stable, no new changes  All other systems were reviewed with the patient and are negative.  MEDICAL HISTORY:  Past Medical History:  Diagnosis Date  . Anemia   . Arthritis   . Colon polyps   .  Olecranon bursitis    Elbow  . Other and unspecified hyperlipidemia   . Palpitations   . Unspecified essential hypertension     SURGICAL HISTORY: Past Surgical History:  Procedure Laterality Date  . COLONOSCOPY W/ POLYPECTOMY  01/2004  . CRYOTHERAPY     female-cryosurgery stated by pt  . ESOPHAGOGASTRODUODENOSCOPY N/A 04/09/2016   Procedure: ESOPHAGOGASTRODUODENOSCOPY (EGD);  Surgeon: Henry L Danis III, MD;  Location: [facility];  Service: Endoscopy;  Laterality: N/A;  . POLYPECTOMY     from vocal surgery    I have reviewed the social history and family history with the patient and they are unchanged from previous note.  She stopped smoking in 1996 and began smoking sometime when she was a teenager.  ALLERGIES:  has No Known Allergies.  MEDICATIONS:  Current Outpatient Prescriptions  Medication Sig Dispense Refill  . Cholecalciferol (VITAMIN D3) 5000 units CAPS Take 5,000 Units by mouth daily.     . Cyanocobalamin (VITAMIN B 12 PO) Take 1 tablet by mouth daily.    . docusate sodium (COLACE) 100 MG capsule Take 1 capsule (100 mg total) by mouth daily. 90 capsule 1  . ENSURE PLUS (ENSURE PLUS) LIQD Take 1 Can by mouth 2 (two) times daily between meals.    . ferrous sulfate 325 (65 FE) MG tablet TAKE 1 TABLET BY MOUTH TWICE DAILY WITH A MEAL (Patient taking differently: Takes 1 daily) 60 tablet 1  . imatinib (GLEEVEC) 400 MG tablet Take   1 tablet (400 mg total) by mouth daily. Take with meals and large glass of water.Caution:Chemotherapy. 30 tablet 2   No current facility-administered medications for this visit.     PHYSICAL EXAMINATION:  ECOG PERFORMANCE STATUS: 0  Vitals:   09/25/16 1041  BP: (!) 158/68  Pulse: 70  Resp: 18  Temp: 98.6 F (37 C)   Filed Weights   09/25/16 1041  Weight: 142 lb 14.4 oz (64.8 kg)   GENERAL:alert. SKIN: skin color, texture, turgor are normal, no rashes or significant lesions EYES: normal, Conjunctiva are pink and non-injected,  sclera clear OROPHARYNX:no exudate, no erythema and lips, buccal mucosa, and tongue normal  NECK: supple, thyroid normal size, non-tender, without nodularity LYMPH:  no palpable lymphadenopathy in the cervical, axillary or inguinal LUNGS: clear to auscultation and percussion with normal breathing effort HEART: regular rate & rhythm and no murmurs and no lower extremity edema ABDOMEN:abdomen soft, non-tender and normal bowel sounds Musculoskeletal:no cyanosis of digits and no clubbing  NEURO: alert & oriented x 3 with fluent speech, no focal motor/sensory deficits  LABORATORY DATA:  I have reviewed the data as listed CBC Latest Ref Rng & Units 09/22/2016 08/25/2016 07/16/2016  WBC 3.9 - 10.3 10e3/uL 4.8 5.3 5.4  Hemoglobin 11.6 - 15.9 g/dL 14.9 14.0 13.0  Hematocrit 34.8 - 46.6 % 44.9 41.8 39.6  Platelets 145 - 400 10e3/uL 164 183 236     CMP Latest Ref Rng & Units 09/22/2016 08/25/2016 07/16/2016  Glucose 70 - 140 mg/dl 90 108 93  BUN 7.0 - 26.0 mg/dL 8.6 11.2 8.1  Creatinine 0.6 - 1.1 mg/dL 1.1 1.1 1.2(H)  Sodium 136 - 145 mEq/L 142 143 144  Potassium 3.5 - 5.1 mEq/L 4.0 4.1 4.3  Chloride 101 - 111 mmol/L - - -  CO2 22 - 29 mEq/L 24 27 25  Calcium 8.4 - 10.4 mg/dL 9.6 9.3 9.2  Total Protein 6.4 - 8.3 g/dL 7.8 7.2 7.4  Total Bilirubin 0.20 - 1.20 mg/dL 0.53 0.32 0.42  Alkaline Phos 40 - 150 U/L 82 84 88  AST 5 - 34 U/L 24 19 21  ALT 0 - 55 U/L 7 <3 <3   PATHOLOGY REPORT  Diagnosis 04/09/2016 Stomach, biopsy - GASTROINTESTINAL STROMAL TUMOR (GIST) - SEE COMMENT. Microscopic Comment The biopsy fragments reveal a hypercellular spindle cell proliferation with cytologic atypia, a few mitoses and necrosis. 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 Immunohistochemical stains were performed revealing that the tumor cells are strongly positive for CD117 and CD34. They are negative for cytokeratin A18, cytokeratin AE1/AE3, desmin,  smooth muscle actin, smooth muscle myosin and S100. Dr. John Patrick has reviewed the case and concurs with this interpretation. Dr. Fang was paged on 04/11/16.   Diagnosis 04/14/2016 Peritoneum, biopsy, Anterior abdomen - GASTROINTESTINAL STROMAL TUMOR (GIST). - SEE COMMENT. Microscopic Comment The tumor is morphologically identical to the patient's previous biopsy ([id number]). Additional studies can be performed upon clinical request. (JBK:gt, 04/15/16)    RADIOGRAPHIC STUDIES: I have personally reviewed the radiological images as listed and agreed with the findings in the report. No results found.   EGD 04/09/2016 Dr. Danis  The esophagus was normal. Findings: A large, fungating and infiltrative, very deeply-ulcerated and necrotic, malignant-appearing mass with stigmata of recent bleeding was found in the gastric fundus. Biopsies were taken with a cold forceps for histology. The duodenal bulb was normal. - Normal esophagus. - Likely malignant gastric tumor in the gastric fundus. extensively   biopsied. - Normal duodenal bulb.   CT CAP W CONTRAST  09/22/16 IMPRESSION: 1. Nodular appearing GI stromal tumor along the posterior gastric wall with associated peritoneal implants and hepatic metastases, stable from 07/16/2016. 2. Improving mid and lower lung zone predominant peribronchovascular ground-glass, possibly related to drug therapy. 3. A few scattered pulmonary nodules are stable. 4. Aortic atherosclerosis (ICD10-170.0). Coronary artery calcification.  ASSESSMENT & PLAN:  82 y.o. African-American female, with past medical history of hypertension, otherwise healthy and active, presented with progressive weight loss, fatigue, anorexia and intermittent epigastric pain.  1. Malignant gastrointestinal stromal tumor (GIST) of stomach, with liver and peritoneal metastasis -I previously reviewed her CT scan findings, gastric mass and peritoneal mass biopsy results, in  details with patient and her family members -We previously reviewed the neck to history of GIST, and treatment options. -We previously discussed this is not curable disease, but is very treatable, with mediastinal survival 4-5 years -Her KIT mutation showed exon 11 deletion, which predicts better response to TKI,  I reviewed the results in detail with her -She has been tolerating Gleevec very well, no noticeable side effects, except mild anorexia which could be related and occasional cough with phlegm production.  she overall has felt better since she started the Gleevec. -We previously reviewed her CT scan findings from 07/16/2016, this showed a positive response to therapy with regression of the primary gastric neoplasm, as well as regression of numerous intraperitoneal metastatic lesions. But several small pulmonary nodules were noted, some of which are new compared to the prior examination. Also seen, is widespread mild ground-glass attenuation and septal thickening in the lungs bilaterally which is new compared to the prior study. I'm not sure what the etiology of her pulmonary changes, COPD vs pneumonitis from Gleevec is possible, she is clinically asymptomatic, I'll continue monitoring. -I reviewed her restaging CT scan from 09/22/2016, the image were reviewed in person. She had stable disease in the abdomen, her groundglass changes in bilateral lungs have much improved, no significant evidence of pulmonary metastasis. This is likely related to Gleevec, which has improved, we'll continue monitoring. She is clinically asymptomatic.  -Continue Gleevec 400 mg daily, tolerating well.  2. Anemia, iron deficiency -Her anemia has overall improved since she started iron pill. Her initial study will consistent with iron deficiency -Her anemia has resolved after oral iron supplement, so she was reduced to 1 ferrous sulfate tablet a day.   3. HTN -Continue medication, follow up with primary care  physician  4. Abdominal pain  -Secondary to her peritoneal metastasis - resolved since she started Gleevec -The patient is no longer taking Tramadol.  5. Poor appetite and weight loss  -possible related to Gleevec vs her underline malignancy  -Previously Encouraged the patient to eat high calorie and high protein food. -We previously discussed medication to stimulate the patient's appetite, she does not want it for now  -The patient states she will try high protein and calorie diet, and nutritional supplement  - Patient's appetite has much improved lately. She is eating 3 meals a day.   6. Pulmonary nodules and groundglass change -Uncertain etiology. Atypical infection, COPD, vs Gleevec induced pneumonitis are on the differential. She does not have a clinical presentation of pneumonia. Likely related to Gleevec  -She is clinically asymptomatic, exam was unremarkable.  -repeated CT scan showed significant improvement. She remains to be asymptomatic, we'll continue monitoring  Plan -continue Gleevec 400 mg daily, tolerating well.  - Refill Gleevec for 3 months -   f/u and lab in 6 weeks - repeat scan in 3 months  All questions were answered. The patient knows to call the clinic with any problems, questions or concerns. No barriers to learning was detected.  I spent 25 minutes counseling the patient face to face. The total time spent in the appointment was 30 minutes and more than 50% was on counseling and review of test results  This document serves as a record of services personally performed by Haley Merle, MD. It was created on her behalf by Brandt Loosen, a trained medical scribe. The creation of this record is based on the scribe's personal observations and the provider's statements to them. This document has been checked and approved by the attending provider.   I have reviewed the above documentation for accuracy and completeness and I agree with the above.   Haley Merle,  MD 09/25/2016

## 2016-09-22 ENCOUNTER — Ambulatory Visit (HOSPITAL_COMMUNITY)
Admission: RE | Admit: 2016-09-22 | Discharge: 2016-09-22 | Disposition: A | Discharge status: Home / Self Care | Payer: Medicare Other | Source: Ambulatory Visit | Attending: Hematology | Admitting: Hematology

## 2016-09-22 ENCOUNTER — Other Ambulatory Visit (HOSPITAL_BASED_OUTPATIENT_CLINIC_OR_DEPARTMENT_OTHER): Payer: Medicare Other

## 2016-09-22 ENCOUNTER — Encounter (HOSPITAL_COMMUNITY): Payer: Self-pay

## 2016-09-22 DIAGNOSIS — C49A2 Gastrointestinal stromal tumor of stomach: Secondary | ICD-10-CM

## 2016-09-22 DIAGNOSIS — C787 Secondary malignant neoplasm of liver and intrahepatic bile duct: Secondary | ICD-10-CM | POA: Diagnosis not present

## 2016-09-22 DIAGNOSIS — I700 Atherosclerosis of aorta: Secondary | ICD-10-CM | POA: Diagnosis not present

## 2016-09-22 DIAGNOSIS — Z9889 Other specified postprocedural states: Secondary | ICD-10-CM | POA: Insufficient documentation

## 2016-09-22 DIAGNOSIS — R918 Other nonspecific abnormal finding of lung field: Secondary | ICD-10-CM | POA: Insufficient documentation

## 2016-09-22 LAB — COMPREHENSIVE METABOLIC PANEL
ALT: 7 U/L (ref 0–55)
AST: 24 U/L (ref 5–34)
Albumin: 4.1 g/dL (ref 3.5–5.0)
Alkaline Phosphatase: 82 U/L (ref 40–150)
Anion Gap: 13 mEq/L — ABNORMAL HIGH (ref 3–11)
BILIRUBIN TOTAL: 0.53 mg/dL (ref 0.20–1.20)
BUN: 8.6 mg/dL (ref 7.0–26.0)
CO2: 24 meq/L (ref 22–29)
CREATININE: 1.1 mg/dL (ref 0.6–1.1)
Calcium: 9.6 mg/dL (ref 8.4–10.4)
Chloride: 106 mEq/L (ref 98–109)
EGFR: 56 mL/min/{1.73_m2} — ABNORMAL LOW (ref >90)
GLUCOSE: 90 mg/dL (ref 70–140)
Potassium: 4 mEq/L (ref 3.5–5.1)
SODIUM: 142 meq/L (ref 136–145)
TOTAL PROTEIN: 7.8 g/dL (ref 6.4–8.3)

## 2016-09-22 LAB — CBC WITH DIFFERENTIAL/PLATELET
BASO%: 1.3 % (ref 0.0–2.0)
Basophils Absolute: 0.1 10*3/uL (ref 0.0–0.1)
EOS%: 4.2 % (ref 0.0–7.0)
Eosinophils Absolute: 0.2 10*3/uL (ref 0.0–0.5)
HCT: 44.9 % (ref 34.8–46.6)
HGB: 14.9 g/dL (ref 11.6–15.9)
LYMPH%: 42.8 % (ref 14.0–49.7)
MCH: 34.3 pg — AB (ref 25.1–34.0)
MCHC: 33.1 g/dL (ref 31.5–36.0)
MCV: 103.5 fL — ABNORMAL HIGH (ref 79.5–101.0)
MONO#: 0.3 10*3/uL (ref 0.1–0.9)
MONO%: 7.2 % (ref 0.0–14.0)
NEUT%: 44.5 % (ref 38.4–76.8)
NEUTROS ABS: 2.1 10*3/uL (ref 1.5–6.5)
Platelets: 164 10*3/uL (ref 145–400)
RBC: 4.34 10*6/uL (ref 3.70–5.45)
RDW: 13.3 % (ref 11.2–14.5)
WBC: 4.8 10*3/uL (ref 3.9–10.3)
lymph#: 2 10*3/uL (ref 0.9–3.3)

## 2016-09-22 MED ORDER — IOPAMIDOL (ISOVUE-300) INJECTION 61%
INTRAVENOUS | Status: AC
  Filled 2016-09-22: qty 100

## 2016-09-22 MED ORDER — IOPAMIDOL (ISOVUE-300) INJECTION 61%
100.0000 mL | Freq: Once | INTRAVENOUS | Status: AC | PRN
Start: 2016-09-22 — End: 2016-09-22
  Administered 2016-09-22: 100 mL via INTRAVENOUS

## 2016-09-25 ENCOUNTER — Telehealth: Payer: Self-pay | Admitting: Hematology

## 2016-09-25 ENCOUNTER — Ambulatory Visit (HOSPITAL_BASED_OUTPATIENT_CLINIC_OR_DEPARTMENT_OTHER): Payer: Medicare Other | Admitting: Hematology

## 2016-09-25 VITALS — BP 158/68 | HR 70 | Temp 98.6°F | Resp 18 | Ht 66.5 in | Wt 142.9 lb

## 2016-09-25 DIAGNOSIS — D509 Iron deficiency anemia, unspecified: Secondary | ICD-10-CM | POA: Diagnosis not present

## 2016-09-25 DIAGNOSIS — R634 Abnormal weight loss: Secondary | ICD-10-CM

## 2016-09-25 DIAGNOSIS — C787 Secondary malignant neoplasm of liver and intrahepatic bile duct: Secondary | ICD-10-CM

## 2016-09-25 DIAGNOSIS — I10 Essential (primary) hypertension: Secondary | ICD-10-CM

## 2016-09-25 DIAGNOSIS — E46 Unspecified protein-calorie malnutrition: Secondary | ICD-10-CM

## 2016-09-25 DIAGNOSIS — C49A2 Gastrointestinal stromal tumor of stomach: Secondary | ICD-10-CM | POA: Diagnosis not present

## 2016-09-25 DIAGNOSIS — C786 Secondary malignant neoplasm of retroperitoneum and peritoneum: Secondary | ICD-10-CM

## 2016-09-25 DIAGNOSIS — D500 Iron deficiency anemia secondary to blood loss (chronic): Secondary | ICD-10-CM

## 2016-09-25 MED ORDER — IMATINIB MESYLATE 400 MG PO TABS: 400.0000 mg | ORAL_TABLET | Freq: Every day | ORAL | 2 refills | Status: DC

## 2016-09-25 NOTE — Telephone Encounter (Signed)
Gave patient AVS and calender per 4/12 los.  

## 2016-09-27 ENCOUNTER — Encounter: Payer: Self-pay | Admitting: Hematology

## 2016-10-17 MED FILL — IMATINIB MESYLATE 400 MG TA: 400 | 30 days supply | Qty: 30 | Fill #0

## 2016-11-05 ENCOUNTER — Ambulatory Visit (INDEPENDENT_AMBULATORY_CARE_PROVIDER_SITE_OTHER): Payer: Medicare Other | Admitting: Internal Medicine

## 2016-11-05 ENCOUNTER — Encounter: Payer: Self-pay | Admitting: Internal Medicine

## 2016-11-05 DIAGNOSIS — D500 Iron deficiency anemia secondary to blood loss (chronic): Secondary | ICD-10-CM

## 2016-11-05 NOTE — Progress Notes (Signed)
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      04/09/2016 Initial Biopsy    Gastric mass biopsy showed gastrointestinal stromal tumor, I feel mitosis and necrosis, intermediate to high-grade.      04/09/2016 Imaging    CT chest, abdomen and pelvis with contrast showed a large exophytic mass in the proximal lateral stomach measuring 10 x 7.4 cm, nodular lesions within omentum and multiple liver nodules are highly suspicious for metastatic disease.      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 Today's study demonstrates a positive response to therapy with regression of the primary gastric neoplasm, as well as regression of numerous intraperitoneal metastatic  lesions. Additionally, the liver lesions seen on the prior study appear far more well-defined, decreased in size and are much lower attenuation, suggesting internal areas of necrosis. 2. Several small pulmonary nodules are noted, some of which are new compared to the prior examination. These are nonspecific, but metastatic disease to the lungs is not excluded. Attention on follow-up imaging is recommended. 3. Importantly, there is also widespread ground-glass attenuation and septal thickening in the lungs bilaterally which is new compared to the prior study. In the appropriate clinical setting, this could reflect a drug reaction. Other differential considerations include atypical infection, or even developing interstitial lung disease. Clinical correlation is suggested. 4. Aortic atherosclerosis, in addition to right coronary artery disease. 5. Mild diffuse bronchial wall thickening with mild centrilobular and paraseptal emphysema; imaging findings suggestive of underlying COPD. 6. Additional incidental findings, as above.      09/22/2016 Imaging     CT CAP W CONTRAST   IMPRESSION: 1. Nodular appearing GI stromal tumor along the posterior gastric wall with associated peritoneal implants and hepatic metastases, stable from 07/16/2016. 2. Improving mid and lower lung zone predominant peribronchovascular ground-glass, possibly related to drug therapy. 3. A few scattered pulmonary nodules are stable. 4. Aortic atherosclerosis (ICD10-170.0). Coronary artery calcification.      History of present illness (04/10/2016):   Haley Mcmillan Cravenis a 83 y.o.femalewith medical history significantfor anemia, arthritis, colonic polyps, hyperlipidemia, hypertension presents to the emergency department after recent GI visit with worsening abdominal pain. She states the pain is intermittent, started  about 2 weeks ago, located in the epigastric area, no significant nausea or change of her bowel  habits. Her appetite and energy level has decreased lately, although she is able to take care of herself. When the pain resolves, she remains to be active and do house cleaning etc. he has lost about 30 pounds in the past 10-12 months, and the most weight with loss in the past few months. A CT scan showed a large exophytic mass in proximal lateral stomach, measuring 10 x 7.4 cm, and nodular lesions in the omentum, and a multiple liver lesions suspicious for metastatic disease. She underwent EGD by Dr. Simona Huh yesterday, which showed a large gastric tumor in the fundus, biopsy was done, pathology results still pending.  CURRENT THERAPY: Gleevec '400mg'$  daily, started on 04/22/2016  INTERVAL HISTORY: [name] returns for follow-up. She presents to the clinic today reporting she is eating a lot now. Her appetite is a lot better. She denies stomach pain, nausea or bloating. She has been doing well with Gleevac.   Her family reunion is in Late July and she will not be going.    REVIEW OF SYSTEMS:   Constitutional: Denies fevers, chills  (+) appetite good (+) gained 8 pain purposefully  Eyes: Denies blurriness of vision Ears, nose, mouth, throat, and face: Denies mucositis or sore throat Respiratory: Denies dyspnea or wheezes  Cardiovascular: Denies palpitation, chest discomfort or lower extremity swelling Gastrointestinal:  Denies nausea, heartburn or change in bowel habits Skin: Denies abnormal skin rashes Lymphatics: Denies new lymphadenopathy or easy bruising Neurological:Denies numbness, tingling or new weaknesses Behavioral/Psych: Mood is stable, no new changes  All other systems were reviewed with the patient and are negative.  MEDICAL HISTORY:  Past Medical History:  Diagnosis Date  . Anemia   . Arthritis   . Colon polyps   . Olecranon bursitis    Elbow  . Other and unspecified hyperlipidemia   . Palpitations   . Unspecified essential hypertension     SURGICAL HISTORY: Past  Surgical History:  Procedure Laterality Date  . COLONOSCOPY W/ POLYPECTOMY  01/2004  . CRYOTHERAPY     female-cryosurgery stated by pt  . ESOPHAGOGASTRODUODENOSCOPY N/A 04/09/2016   Procedure: ESOPHAGOGASTRODUODENOSCOPY (EGD);  Surgeon: Doran Stabler, MD;  Location[facility] ENDOSCOPY;  Service: Endoscopy;  Laterality: N/A;  . POLYPECTOMY     from vocal surgery    I have reviewed the social history and family history with the patient and they are unchanged from previous note.  She stopped smoking in 1996 and began smoking sometime when she was a teenager.  ALLERGIES:  has No Known Allergies.  MEDICATIONS:  Current Outpatient Prescriptions  Medication Sig Dispense Refill  . Cholecalciferol (VITAMIN D3) 5000 units CAPS Take 5,000 Units by mouth daily.     . Cyanocobalamin (VITAMIN B 12 PO) Take 1 tablet by mouth daily.    Marland Kitchen imatinib (GLEEVEC) 400 MG tablet Take 1 tablet (400 mg total) by mouth daily. Take with meals and large glass of water.Caution:Chemotherapy. 30 tablet 2  . ENSURE PLUS (ENSURE PLUS) LIQD Take 1 Can by mouth 2 (two) times daily between meals.     No current facility-administered medications for this visit.     PHYSICAL EXAMINATION:  ECOG PERFORMANCE STATUS: 0  Vitals:   11/06/16 1111  BP: (!) 169/65  Pulse: (!) 52  Resp: 20  Temp: 98.6 F (37 C)   Filed Weights   11/06/16 1111  Weight: 150 lb (68 kg)  GENERAL:alert. SKIN: skin color, texture, turgor are normal, no rashes or significant lesions EYES: normal, Conjunctiva are pink and non-injected, sclera clear OROPHARYNX:no exudate, no erythema and lips, buccal mucosa, and tongue normal  NECK: supple, thyroid normal size, non-tender, without nodularity LYMPH:  no palpable lymphadenopathy in the cervical, axillary or inguinal LUNGS: clear to auscultation and percussion with normal breathing effort HEART: regular rate & rhythm and no murmurs and no lower extremity edema ABDOMEN:abdomen soft,  non-tender and normal bowel sounds Musculoskeletal:no cyanosis of digits and no clubbing  NEURO: alert & oriented x 3 with fluent speech, no focal motor/sensory deficits  LABORATORY DATA:  I have reviewed the data as listed CBC Latest Ref Rng & Units 11/06/2016 09/22/2016 08/25/2016  WBC 3.9 - 10.3 10e3/uL 4.3 4.8 5.3  Hemoglobin 11.6 - 15.9 g/dL 12.5 14.9 14.0  Hematocrit 34.8 - 46.6 % 37.2 44.9 41.8  Platelets 145 - 400 10e3/uL 161 164 183     CMP Latest Ref Rng & Units 11/06/2016 09/22/2016 08/25/2016  Glucose 70 - 140 mg/dl 96 90 108  BUN 7.0 - 26.0 mg/dL 12.4 8.6 11.2  Creatinine 0.6 - 1.1 mg/dL 1.2(H) 1.1 1.1  Sodium 136 - 145 mEq/L 143 142 143  Potassium 3.5 - 5.1 mEq/L 4.2 4.0 4.1  Chloride 101 - 111 mmol/L - - -  CO2 22 - 29 mEq/L '26 24 27  '$ Calcium 8.4 - 10.4 mg/dL 8.8 9.6 9.3  Total Protein 6.4 - 8.3 g/dL 6.6 7.8 7.2  Total Bilirubin 0.20 - 1.20 mg/dL 0.37 0.53 0.32  Alkaline Phos 40 - 150 U/L 69 82 84  AST 5 - 34 U/L '20 24 19  '$ ALT 0 - 55 U/L 11 7 <3   PATHOLOGY REPORT  Diagnosis 04/09/2016 Stomach, biopsy - GASTROINTESTINAL STROMAL TUMOR (GIST) - SEE COMMENT. Microscopic Comment The biopsy fragments reveal a hypercellular spindle cell proliferation with cytologic atypia, a few mitoses and necrosis. 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 Immunohistochemical stains were performed revealing that the tumor cells are strongly positive for CD117 and CD34. They are negative for cytokeratin A18, cytokeratin AE1/AE3, desmin, smooth muscle actin, smooth muscle myosin and S100. Dr. Claudette Laws has reviewed the case and concurs with this interpretation. Dr. Annamaria Boots was paged on 04/11/16.   Diagnosis 04/14/2016 Peritoneum, biopsy, Anterior abdomen - GASTROINTESTINAL STROMAL TUMOR (GIST). - SEE COMMENT. Microscopic Comment The tumor is morphologically identical to the patient's previous biopsy ([id number]). Additional  studies can be performed upon clinical request. (JBK:gt, 04/15/16)    RADIOGRAPHIC STUDIES: I have personally reviewed the radiological images as listed and agreed with the findings in the report. No results found.   EGD 04/09/2016 Dr. Loletha Carrow  The esophagus was normal. Findings: A large, fungating and infiltrative, very deeply-ulcerated and necrotic, malignant-appearing mass with stigmata of recent bleeding was found in the gastric fundus. Biopsies were taken with a cold forceps for histology. The duodenal bulb was normal. - Normal esophagus. - Likely malignant gastric tumor in the gastric fundus. extensively biopsied. - Normal duodenal bulb.   CT CAP W CONTRAST  09/22/16 IMPRESSION: 1. Nodular appearing GI stromal tumor along the posterior gastric wall with associated peritoneal implants and hepatic metastases, stable from 07/16/2016. 2. Improving mid and lower lung zone predominant peribronchovascular ground-glass, possibly related to drug therapy. 3. A few scattered pulmonary nodules are stable. 4. Aortic atherosclerosis (ICD10-170.0). Coronary artery calcification.  ASSESSMENT & PLAN:  83 y.o. African-American female, with past medical history of  hypertension, otherwise healthy and active, presented with progressive weight loss, fatigue, anorexia and intermittent epigastric pain.  1. Malignant gastrointestinal stromal tumor (GIST) of stomach, with liver and peritoneal metastasis -I previously reviewed her CT scan findings, gastric mass and peritoneal mass biopsy results, in details with patient and her family members -We previously reviewed the neck to history of GIST, and treatment options. -We previously discussed this is not curable disease, but is very treatable, with mediastinal survival 4-5 years -Her KIT mutation showed exon 11 deletion, which predicts better response to TKI,  I previously reviewed the results in detail with her -She has been tolerating Gleevec very  well, no noticeable side effects, except mild anorexia which could be related and occasional cough with phlegm production.  she overall has felt better since she started the Gleevec. -We previously reviewed her CT scan findings from 07/16/2016, this showed a positive response to therapy with regression of the primary gastric neoplasm, as well as regression of numerous intraperitoneal metastatic lesions. But several small pulmonary nodules were noted, some of which are new compared to the prior examination. Also seen, is widespread mild ground-glass attenuation and septal thickening in the lungs bilaterally which is new compared to the prior study. I'm not sure what the etiology of her pulmonary changes, COPD vs pneumonitis from Gleevec is possible, she is clinically asymptomatic, I'll continue monitoring. -I previously reviewed her restaging CT scan from 09/22/2016, the image were reviewed in person. She had stable disease in the abdomen, her groundglass changes in bilateral lungs have much improved, no significant evidence of pulmonary metastasis. This is likely related to Gleevec, which has improved, we'll continue monitoring. She is clinically asymptomatic.  -Continue Gleevec 400 mg daily, tolerating well. -Labs reviewed and her anemia is good and her liver  Kidney function is normal, Cr is slightly elevated and I encouraged her to drink more water.  -Will repeat CT scan a few days before next visit and she will f/u to make sure it is scheduled.    2. Anemia, iron deficiency -Her anemia has overall improved since she started iron pill. Her initial study will consistent with iron deficiency -Her anemia has resolved after oral iron supplement, so she was reduced to 1 ferrous sulfate tablet a day.  -She has been taking iron pill since hospital and her iron levels have improved overall  3. HTN -Continue medication, follow up with primary care physician -Her BP was high yesterday and today -I encouraged  her to check BP at home after 5 minutes of sitting down. If systolic above 140-150 and this happens repeatedly she should see her PCP  4. Abdominal pain  -Secondary to her peritoneal metastasis - resolved since she started Gleevec -The patient is no longer taking Tramadol.  5. Poor appetite and weight loss  -possible related to Gleevec vs her underline malignancy  -Previously Encouraged the patient to eat high calorie and high protein food. -We previously discussed medication to stimulate the patient's appetite, she does not want it for now  -The patient states she will try high protein and calorie diet, and nutritional supplement  - Patient's appetite has much improved lately. She is eating 3 meals a day.  -Her appetite is much improved and she is gaining weight back  6. Pulmonary nodules and groundglass change -Uncertain etiology. Atypical infection, COPD, vs Gleevec induced pneumonitis are on the differential. She does not have a clinical presentation of pneumonia. Likely related to Gleevec  -She is clinically asymptomatic, exam was  unremarkable.  -repeated CT scan showed significant improvement. She remains to be asymptomatic, we'll continue monitoring  Plan -F/u the first week of Aug -Lab and CT scan 1-2 days before visit  -continue Gleevec 400 mg daily, tolerating well.    All questions were answered. The patient knows to call the clinic with any problems, questions or concerns. No barriers to learning was detected.  I spent 25 minutes counseling the patient face to face. The total time spent in the appointment was 30 minutes and more than 50% was on counseling and review of test results  This document serves as a record of services personally performed by Truitt Merle, MD. It was created on her behalf by Joslyn Devon, a trained medical scribe. The creation of this record is based on the scribe's personal observations and the provider's statements to them. This document has been  checked and approved by the attending provider.  I have reviewed the above documentation for accuracy and completeness and I agree with the above.   Truitt Merle, MD 11/06/2016

## 2016-11-05 NOTE — Patient Instructions (Addendum)
Stop taking the colace to see if this helps. If you need it you can take it once or twice a week if you need it.  Stop taking the iron.

## 2016-11-06 ENCOUNTER — Other Ambulatory Visit (HOSPITAL_BASED_OUTPATIENT_CLINIC_OR_DEPARTMENT_OTHER): Payer: Medicare Other

## 2016-11-06 ENCOUNTER — Encounter: Payer: Self-pay | Admitting: Hematology

## 2016-11-06 ENCOUNTER — Telehealth: Payer: Self-pay | Admitting: Hematology

## 2016-11-06 ENCOUNTER — Ambulatory Visit (HOSPITAL_BASED_OUTPATIENT_CLINIC_OR_DEPARTMENT_OTHER): Payer: Medicare Other | Admitting: Hematology

## 2016-11-06 VITALS — BP 169/65 | HR 52 | Temp 98.6°F | Resp 20 | Ht 66.5 in | Wt 150.0 lb

## 2016-11-06 DIAGNOSIS — C49A2 Gastrointestinal stromal tumor of stomach: Secondary | ICD-10-CM

## 2016-11-06 DIAGNOSIS — D509 Iron deficiency anemia, unspecified: Secondary | ICD-10-CM

## 2016-11-06 DIAGNOSIS — I10 Essential (primary) hypertension: Secondary | ICD-10-CM | POA: Diagnosis not present

## 2016-11-06 DIAGNOSIS — D500 Iron deficiency anemia secondary to blood loss (chronic): Secondary | ICD-10-CM

## 2016-11-06 DIAGNOSIS — R918 Other nonspecific abnormal finding of lung field: Secondary | ICD-10-CM

## 2016-11-06 LAB — IRON AND TIBC
%SAT: 36 % (ref 21–57)
IRON: 86 ug/dL (ref 41–142)
TIBC: 238 ug/dL (ref 236–444)
UIBC: 151 ug/dL (ref 120–384)

## 2016-11-06 LAB — CBC WITH DIFFERENTIAL/PLATELET
BASO%: 1.1 % (ref 0.0–2.0)
Basophils Absolute: 0 10*3/uL (ref 0.0–0.1)
EOS%: 5.3 % (ref 0.0–7.0)
Eosinophils Absolute: 0.2 10*3/uL (ref 0.0–0.5)
HEMATOCRIT: 37.2 % (ref 34.8–46.6)
HGB: 12.5 g/dL (ref 11.6–15.9)
LYMPH#: 1.8 10*3/uL (ref 0.9–3.3)
LYMPH%: 42.6 % (ref 14.0–49.7)
MCH: 34.9 pg — AB (ref 25.1–34.0)
MCHC: 33.6 g/dL (ref 31.5–36.0)
MCV: 103.7 fL — ABNORMAL HIGH (ref 79.5–101.0)
MONO#: 0.4 10*3/uL (ref 0.1–0.9)
MONO%: 9 % (ref 0.0–14.0)
NEUT#: 1.8 10*3/uL (ref 1.5–6.5)
NEUT%: 42 % (ref 38.4–76.8)
Platelets: 161 10*3/uL (ref 145–400)
RBC: 3.58 10*6/uL — ABNORMAL LOW (ref 3.70–5.45)
RDW: 12.8 % (ref 11.2–14.5)
WBC: 4.3 10*3/uL (ref 3.9–10.3)

## 2016-11-06 LAB — COMPREHENSIVE METABOLIC PANEL
ALBUMIN: 3.6 g/dL (ref 3.5–5.0)
ALK PHOS: 69 U/L (ref 40–150)
ALT: 11 U/L (ref 0–55)
AST: 20 U/L (ref 5–34)
Anion Gap: 8 mEq/L (ref 3–11)
BUN: 12.4 mg/dL (ref 7.0–26.0)
CALCIUM: 8.8 mg/dL (ref 8.4–10.4)
CHLORIDE: 109 meq/L (ref 98–109)
CO2: 26 mEq/L (ref 22–29)
Creatinine: 1.2 mg/dL — ABNORMAL HIGH (ref 0.6–1.1)
EGFR: 52 mL/min/{1.73_m2} — AB (ref >90)
Glucose: 96 mg/dl (ref 70–140)
POTASSIUM: 4.2 meq/L (ref 3.5–5.1)
SODIUM: 143 meq/L (ref 136–145)
Total Bilirubin: 0.37 mg/dL (ref 0.20–1.20)
Total Protein: 6.6 g/dL (ref 6.4–8.3)

## 2016-11-06 LAB — FERRITIN: FERRITIN: 83 ng/mL (ref 9–269)

## 2016-11-06 NOTE — Telephone Encounter (Signed)
Gave patient AVS and calender per 5/24 LOS - central radiology to contact patient with ct.

## 2016-11-08 ENCOUNTER — Encounter: Payer: Self-pay | Admitting: Hematology

## 2016-11-09 NOTE — Assessment & Plan Note (Signed)
Okay to stop iron pills given last CBC readings and stop colace pills which are the likely cause of the urgency with stools. We talked about signs of constipation and how she can use colace prn if she gets problems with bowels.

## 2016-11-09 NOTE — Progress Notes (Signed)
   Subjective:    Patient ID: Haley Mcmillan, female    DOB: 27-Feb-1934, 83 y.o.   MRN: [id number]  HPI The patient is an 83 YO female coming in for follow up of her anemia (iron loss from GI loss from her GIST, now under chemo and no ongoing blood loss known, denies dark stools, taking iron only once per day per our previous recommendations, denies dizziness or lightheadedness) and concerns about diarrhea (she is not able to control when she wants to use the bathroom, still taking colace as advised from the hospital many months ago daily, she normally did not have problems with constipation but does not want problems, drinks an okay amount of water, eating much better now with more fiber and better amounts of food now that her GI tumor is under control).   Review of Systems  Constitutional: Positive for appetite change. Negative for activity change, chills, diaphoresis and unexpected weight change.       Eating better again  Respiratory: Negative.   Cardiovascular: Negative.   Gastrointestinal: Negative for abdominal distention, abdominal pain, constipation, diarrhea, nausea and vomiting.       Urgency with bowel movements  Musculoskeletal: Negative.   Neurological: Negative.       Objective:   Physical Exam  Constitutional: She is oriented to person, place, and time. She appears well-developed and well-nourished.  HENT:  Head: Normocephalic and atraumatic.  Eyes: EOM are normal.  Neck: Normal range of motion.  Cardiovascular: Normal rate and regular rhythm.   Pulmonary/Chest: Effort normal and breath sounds normal.  Abdominal: Soft. Bowel sounds are normal. She exhibits no distension and no mass. There is no tenderness. There is no rebound and no guarding.  Neurological: She is alert and oriented to person, place, and time. Coordination normal.  More steady on her feet than prior visit  Skin: Skin is warm and dry.   Vitals:   11/05/16 1006  BP: (!) 144/70  Pulse: 67  Resp: 12    Temp: 98 F (36.7 C)  TempSrc: Oral  SpO2: 99%  Weight: 150 lb (68 kg)  Height: 5' 6.5" (1.689 m)      Assessment & Plan:

## 2016-11-14 MED FILL — IMATINIB MESYLATE 400 MG TA: 400 | 30 days supply | Qty: 30 | Fill #1

## 2016-12-16 MED FILL — IMATINIB MESYLATE 400 MG TA: 400 | 30 days supply | Qty: 30 | Fill #2

## 2017-01-14 ENCOUNTER — Ambulatory Visit (HOSPITAL_COMMUNITY)
Admission: RE | Admit: 2017-01-14 | Discharge: 2017-01-14 | Disposition: A | Discharge status: Home / Self Care | Payer: Medicare Other | Source: Ambulatory Visit | Attending: Hematology | Admitting: Hematology

## 2017-01-14 ENCOUNTER — Other Ambulatory Visit (HOSPITAL_BASED_OUTPATIENT_CLINIC_OR_DEPARTMENT_OTHER): Payer: Medicare Other

## 2017-01-14 DIAGNOSIS — I700 Atherosclerosis of aorta: Secondary | ICD-10-CM | POA: Diagnosis not present

## 2017-01-14 DIAGNOSIS — C786 Secondary malignant neoplasm of retroperitoneum and peritoneum: Secondary | ICD-10-CM | POA: Insufficient documentation

## 2017-01-14 DIAGNOSIS — C49A2 Gastrointestinal stromal tumor of stomach: Secondary | ICD-10-CM

## 2017-01-14 DIAGNOSIS — D509 Iron deficiency anemia, unspecified: Secondary | ICD-10-CM | POA: Diagnosis not present

## 2017-01-14 DIAGNOSIS — D500 Iron deficiency anemia secondary to blood loss (chronic): Secondary | ICD-10-CM

## 2017-01-14 LAB — CBC WITH DIFFERENTIAL/PLATELET
BASO%: 1 % (ref 0.0–2.0)
BASOS ABS: 0 10*3/uL (ref 0.0–0.1)
EOS%: 4.7 % (ref 0.0–7.0)
Eosinophils Absolute: 0.2 10*3/uL (ref 0.0–0.5)
HCT: 39.7 % (ref 34.8–46.6)
HGB: 13.3 g/dL (ref 11.6–15.9)
LYMPH#: 1.6 10*3/uL (ref 0.9–3.3)
LYMPH%: 33.3 % (ref 14.0–49.7)
MCH: 35.7 pg — ABNORMAL HIGH (ref 25.1–34.0)
MCHC: 33.5 g/dL (ref 31.5–36.0)
MCV: 106.4 fL — ABNORMAL HIGH (ref 79.5–101.0)
MONO#: 0.5 10*3/uL (ref 0.1–0.9)
MONO%: 10.1 % (ref 0.0–14.0)
NEUT%: 50.9 % (ref 38.4–76.8)
NEUTROS ABS: 2.4 10*3/uL (ref 1.5–6.5)
Platelets: 173 10*3/uL (ref 145–400)
RBC: 3.73 10*6/uL (ref 3.70–5.45)
RDW: 13.1 % (ref 11.2–14.5)
WBC: 4.7 10*3/uL (ref 3.9–10.3)

## 2017-01-14 LAB — IRON AND TIBC
%SAT: 31 % (ref 21–57)
Iron: 92 ug/dL (ref 41–142)
TIBC: 291 ug/dL (ref 236–444)
UIBC: 200 ug/dL (ref 120–384)

## 2017-01-14 LAB — COMPREHENSIVE METABOLIC PANEL
ALT: 14 U/L (ref 0–55)
AST: 24 U/L (ref 5–34)
Albumin: 4 g/dL (ref 3.5–5.0)
Alkaline Phosphatase: 76 U/L (ref 40–150)
Anion Gap: 10 mEq/L (ref 3–11)
BILIRUBIN TOTAL: 0.55 mg/dL (ref 0.20–1.20)
BUN: 13.2 mg/dL (ref 7.0–26.0)
CO2: 29 meq/L (ref 22–29)
Calcium: 9.7 mg/dL (ref 8.4–10.4)
Chloride: 104 mEq/L (ref 98–109)
Creatinine: 1.1 mg/dL (ref 0.6–1.1)
EGFR: 53 mL/min/{1.73_m2} — AB (ref >90)
GLUCOSE: 89 mg/dL (ref 70–140)
POTASSIUM: 4.1 meq/L (ref 3.5–5.1)
SODIUM: 143 meq/L (ref 136–145)
TOTAL PROTEIN: 7.5 g/dL (ref 6.4–8.3)

## 2017-01-14 LAB — FERRITIN: Ferritin: 90 ng/ml (ref 9–269)

## 2017-01-14 MED ORDER — IOPAMIDOL (ISOVUE-300) INJECTION 61%
INTRAVENOUS | Status: AC
  Filled 2017-01-14: qty 100

## 2017-01-14 MED ORDER — IOPAMIDOL (ISOVUE-300) INJECTION 61%
100.0000 mL | Freq: Once | INTRAVENOUS | Status: AC | PRN
  Administered 2017-01-14: 100 mL via INTRAVENOUS

## 2017-01-15 ENCOUNTER — Other Ambulatory Visit: Payer: Self-pay | Admitting: Hematology

## 2017-01-15 DIAGNOSIS — C49A2 Gastrointestinal stromal tumor of stomach: Secondary | ICD-10-CM

## 2017-01-15 NOTE — Progress Notes (Signed)
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      04/09/2016 Initial Biopsy    Gastric mass biopsy showed gastrointestinal stromal tumor, I feel mitosis and necrosis, intermediate to high-grade.      04/09/2016 Imaging    CT chest, abdomen and pelvis with contrast showed a large exophytic mass in the proximal lateral stomach measuring 10 x 7.4 cm, nodular lesions within omentum and multiple liver nodules are highly suspicious for metastatic disease.      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 Today's study demonstrates a positive response to therapy with regression of the primary gastric neoplasm, as well as regression of numerous intraperitoneal metastatic  lesions. Additionally, the liver lesions seen on the prior study appear far more well-defined, decreased in size and are much lower attenuation, suggesting internal areas of necrosis. 2. Several small pulmonary nodules are noted, some of which are new compared to the prior examination. These are nonspecific, but metastatic disease to the lungs is not excluded. Attention on follow-up imaging is recommended. 3. Importantly, there is also widespread ground-glass attenuation and septal thickening in the lungs bilaterally which is new compared to the prior study. In the appropriate clinical setting, this could reflect a drug reaction. Other differential considerations include atypical infection, or even developing interstitial lung disease. Clinical correlation is suggested. 4. Aortic atherosclerosis, in addition to right coronary artery disease. 5. Mild diffuse bronchial wall thickening with mild centrilobular and paraseptal emphysema; imaging findings suggestive of underlying COPD. 6. Additional incidental findings, as above.      09/22/2016 Imaging     CT CAP W CONTRAST   IMPRESSION: 1. Nodular appearing GI stromal tumor along the posterior gastric wall with associated peritoneal implants and hepatic metastases, stable from 07/16/2016. 2. Improving mid and lower lung zone predominant peribronchovascular ground-glass, possibly related to drug therapy. 3. A few scattered pulmonary nodules are stable. 4. Aortic atherosclerosis (ICD10-170.0). Coronary artery calcification.      01/14/2017 Imaging    CT CAP w Contrast  IMPRESSION: 1. Further decrease in size of large GI stromal tumor arising from the greater curvature of the stomach with improvement in hepatic and multifocal peritoneal metastases. 2. Further improvement in lower  lung predominant peribronchovascular ground-glass density, probably inflammatory (drug reaction or or infectious). 3. No acute findings. 4.  Aortic  Atherosclerosis (ICD10-I70.0).      History of present illness (04/10/2016):   Haley Mcmillan a 83 y.o.femalewith medical history significantfor anemia, arthritis, colonic polyps, hyperlipidemia, hypertension presents to the emergency department after recent GI visit with worsening abdominal pain. She states the pain is intermittent, started about 2 weeks ago, located in the epigastric area, no significant nausea or change of her bowel habits. Her appetite and energy level has decreased lately, although she is able to take care of herself. When the pain resolves, she remains to be active and do house cleaning etc. he has lost about 30 pounds in the past 10-12 months, and the most weight with loss in the past few months. A CT scan showed a large exophytic mass in proximal lateral stomach, measuring 10 x 7.4 cm, and nodular lesions in the omentum, and a multiple liver lesions suspicious for metastatic disease. She underwent EGD by Dr. Simona Mcmillan yesterday, which showed a large gastric tumor in the fundus, biopsy was done, pathology results still pending.  CURRENT THERAPY: Gleevec 487m daily, started on 04/22/2016  INTERVAL HISTORY: Mrs. CVannostrandreturns for follow-up. She has been doing well. She denies any abnormal bowel movements, bloating or abdominal pains. She has no other questions or concerns. She reports she is "happy" and feels great.   She reports her blood pressure is high today due to some issues that happened before her appointment today.   REVIEW OF SYSTEMS:   Constitutional: Denies fevers, chills  (+) appetite good (+) gained 7 pain purposefully  Eyes: Denies blurriness of vision Ears, nose, mouth, throat, and face: Denies mucositis or sore throat Respiratory: Denies dyspnea or wheezes  Cardiovascular: Denies palpitation, chest discomfort or lower extremity swelling Gastrointestinal:  Denies nausea, heartburn or change in bowel habits Skin: Denies abnormal skin rashes Lymphatics:  Denies new lymphadenopathy or easy bruising Neurological:Denies numbness, tingling or new weaknesses Behavioral/Psych: Mood is stable, no new changes  All other systems were reviewed with the patient and are negative.  MEDICAL HISTORY:  Past Medical History:  Diagnosis Date  . Anemia   . Arthritis   . Colon polyps   . Olecranon bursitis    Elbow  . Other and unspecified hyperlipidemia   . Palpitations   . Unspecified essential hypertension     SURGICAL HISTORY: Past Surgical History:  Procedure Laterality Date  . COLONOSCOPY W/ POLYPECTOMY  01/2004  . CRYOTHERAPY     female-cryosurgery stated by pt  . ESOPHAGOGASTRODUODENOSCOPY N/A 04/09/2016   Procedure: ESOPHAGOGASTRODUODENOSCOPY (EGD);  Surgeon: HDoran Stabler MD;  [facility]  Service: Endoscopy;  Laterality: N/A;  . POLYPECTOMY     from vocal surgery    I have reviewed the social history and family history with the patient and they are unchanged from previous note.  She stopped smoking in 1996 and began smoking sometime when she was a teenager.  ALLERGIES:  has No Known Allergies.  MEDICATIONS:  Current Outpatient Prescriptions  Medication Sig Dispense Refill  . Cholecalciferol (VITAMIN D3) 5000 units CAPS Take 5,000 Units by mouth daily.     . Cyanocobalamin (VITAMIN B 12 PO) Take 1 tablet by mouth daily.    .Marland KitchenENSURE PLUS (ENSURE PLUS) LIQD Take 1 Can by mouth 2 (two) times daily between meals. Uses occasionally    . imatinib (GLEEVEC) 400 MG tablet TAKE 1 TABLET BY MOUTH ONCE DAILY.  TAKE WITH MEALS AND LARGE GLASS OF WATER. CAUTION:CHEMOTHERAPY 30 tablet 2   No current facility-administered medications for this visit.     PHYSICAL EXAMINATION:  ECOG PERFORMANCE STATUS: 0  Vitals:   01/16/17 1030  BP: (!) 185/77  Pulse: 75  Resp: 18  Temp: 98.1 F (36.7 C)   Filed Weights   01/16/17 1030  Weight: 157 lb 6.4 oz (71.4 kg)     GENERAL:alert. SKIN: skin color, texture, turgor are  normal, no rashes or significant lesions EYES: normal, Conjunctiva are pink and non-injected, sclera clear OROPHARYNX:no exudate, no erythema and lips, buccal mucosa, and tongue normal  NECK: supple, thyroid normal size, non-tender, without nodularity LYMPH:  no palpable lymphadenopathy in the cervical, axillary or inguinal LUNGS: clear to auscultation and percussion with normal breathing effort HEART: regular rate & rhythm and no murmurs and no lower extremity edema ABDOMEN:abdomen soft, non-tender and normal bowel sounds Musculoskeletal:no cyanosis of digits and no clubbing  NEURO: alert & oriented x 3 with fluent speech, no focal motor/sensory deficits  LABORATORY DATA:  I have reviewed the data as listed CBC Latest Ref Rng & Units 01/14/2017 11/06/2016 09/22/2016  WBC 3.9 - 10.3 10e3/uL 4.7 4.3 4.8  Hemoglobin 11.6 - 15.9 g/dL 13.3 12.5 14.9  Hematocrit 34.8 - 46.6 % 39.7 37.2 44.9  Platelets 145 - 400 10e3/uL 173 161 164     CMP Latest Ref Rng & Units 01/14/2017 11/06/2016 09/22/2016  Glucose 70 - 140 mg/dl 89 96 90  BUN 7.0 - 26.0 mg/dL 13.2 12.4 8.6  Creatinine 0.6 - 1.1 mg/dL 1.1 1.2(H) 1.1  Sodium 136 - 145 mEq/L 143 143 142  Potassium 3.5 - 5.1 mEq/L 4.1 4.2 4.0  Chloride 101 - 111 mmol/L - - -  CO2 22 - 29 mEq/L _0 Calcium 8.4 - 10.4 mg/dL 9.7 8.8 9.6  Total Protein 6.4 - 8.3 g/dL 7.5 6.6 7.8  Total Bilirubin 0.20 - 1.20 mg/dL 0.55 0.37 0.53  Alkaline Phos 40 - 150 U/L 76 69 82  AST 5 - 34 U/L _1 ALT 0 - 55 U/L _2 PATHOLOGY REPORT  Diagnosis 04/09/2016 Stomach, biopsy - GASTROINTESTINAL STROMAL TUMOR (GIST) - SEE COMMENT. Microscopic Comment The biopsy fragments reveal a hypercellular spindle cell proliferation with cytologic atypia, a few mitoses and necrosis. 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 Immunohistochemical stains were performed revealing that the tumor cells are strongly  positive for CD117 and CD34. They are negative for cytokeratin A18, cytokeratin AE1/AE3, desmin, smooth muscle actin, smooth muscle myosin and S100. Dr. Claudette Laws has reviewed the case and concurs with this interpretation. Dr. Annamaria Boots was paged on 04/11/16.   Diagnosis 04/14/2016 Peritoneum, biopsy, Anterior abdomen - GASTROINTESTINAL STROMAL TUMOR (GIST). - SEE COMMENT. Microscopic Comment The tumor is morphologically identical to the patient's previous biopsy ([id number]). Additional studies can be performed upon clinical request. (JBK:gt, 04/15/16)    RADIOGRAPHIC STUDIES: I have personally reviewed the radiological images as listed and agreed with the findings in the report. No results found.   EGD 04/09/2016 Dr. Loletha Carrow  The esophagus was normal. Findings: A large, fungating and infiltrative, very deeply-ulcerated and necrotic, malignant-appearing mass with stigmata of recent bleeding was found in the gastric fundus. Biopsies were taken with a cold forceps for histology. The duodenal bulb was normal. - Normal esophagus. - Likely malignant gastric tumor in the gastric fundus. extensively biopsied. - Normal duodenal bulb.  CT CAP W CONTRAST  09/22/16 IMPRESSION: 1. Nodular appearing GI stromal tumor along the posterior gastric wall with associated peritoneal implants and hepatic metastases, stable from 07/16/2016. 2. Improving mid and lower lung zone predominant peribronchovascular ground-glass, possibly related to drug therapy. 3. A few scattered pulmonary nodules are stable. 4. Aortic atherosclerosis (ICD10-170.0). Coronary artery calcification.  ASSESSMENT & PLAN:  83 y.o. African-American female, with past medical history of hypertension, otherwise healthy and active, presented with progressive weight loss, fatigue, anorexia and intermittent epigastric pain.  1. Malignant gastrointestinal stromal tumor (GIST) of stomach, with liver and peritoneal metastasis -I  previously reviewed her CT scan findings, gastric mass and peritoneal mass biopsy results, in details with patient and her family members -We previously reviewed the neck to history of GIST, and treatment options. -We previously discussed this is not curable disease, but is very treatable, with mediastinal survival 4-5 years -Her KIT mutation showed exon 11 deletion, which predicts better response to TKI,  I previously reviewed the results in detail with her -She has been tolerating Gleevec very well, no noticeable side effects, except mild anorexia which could be related and occasional cough with phlegm production.  she overall has felt better since she started the [place]. -We previously reviewed her CT scan findings from 07/16/2016, this showed a positive response to therapy with regression of the primary gastric neoplasm, as well as regression of numerous intraperitoneal metastatic lesions. But several small pulmonary nodules were noted, some of which are new compared to the prior examination. Also seen, is widespread mild ground-glass attenuation and septal thickening in the lungs bilaterally which is new compared to the prior study. I'm not sure what the etiology of her pulmonary changes, COPD vs pneumonitis from [place] is possible, she is clinically asymptomatic, I'll continue monitoring. -I previously reviewed her restaging CT scan from 09/22/2016, the image were reviewed in person. She had stable disease in the abdomen, her groundglass changes in bilateral lungs have much improved, no significant evidence of pulmonary metastasis. This is likely related to [place], which has improved, we'll continue monitoring. She is clinically asymptomatic.  - CT scan from 8/1 reviewed with pt, which showed continuous improvement in the gastric mass and peritoneal mets, no new lesions. She is tolerating Gleevec 400 mg very well, clinically doing very well, without any symptoms, will continue treatment. - repeat  scan every 4 months  2. Anemia, iron deficiency -Her anemia has overall improved since she started iron pill. Her initial study will consistent with iron deficiency -Her anemia has resolved after oral iron supplement, so she was reduced to 1 ferrous sulfate tablet a day.  -She has been taking iron pill since hospital and her iron levels have improved overall  3. HTN -Continue medication, follow up with primary care physician -Her BP was high yesterday and today -I encouraged her to check BP at home after 5 minutes of sitting down. If systolic above 505-397 and this happens repeatedly she should see her PCP - I encouraged her to check BP at home.  4. Poor appetite and weight loss  -this has resolved, she has gained more weight lately   5. Pulmonary nodules and groundglass change -Found on CT scan in 06/2016. Uncertain etiology. Atypical infection, COPD, vs Gleevec induced pneumonitis are on the differential. She does not have a clinical presentation of pneumonia. Likely related to [place]  -She is clinically asymptomatic, exam was unremarkable.  -repeated CT scan in 09/2016 and 01/2017 showed significant improvement. She remains to be  asymptomatic, we'll continue monitoring  Plan - Scan from 8/1 reviewed in full, she has had very good response to treatment  -continue Gleevec 400 mg daily, tolerating well. Refilled today - lab and follow up in 8 weeks  All questions were answered. The patient knows to call the clinic with any problems, questions or concerns. No barriers to learning was detected.  I spent 25 minutes counseling the patient face to face. The total time spent in the appointment was 30 minutes and more than 50% was on counseling and review of test results  This document serves as a record of services personally performed by Truitt Merle, MD. It was created on her behalf by Brandt Loosen, a trained medical scribe. The creation of this record is based on the scribe's personal  observations and the provider's statements to them. This document has been checked and approved by the attending provider.

## 2017-01-16 ENCOUNTER — Ambulatory Visit (HOSPITAL_BASED_OUTPATIENT_CLINIC_OR_DEPARTMENT_OTHER): Payer: Medicare Other | Admitting: Hematology

## 2017-01-16 ENCOUNTER — Telehealth: Payer: Self-pay | Admitting: Hematology

## 2017-01-16 ENCOUNTER — Other Ambulatory Visit: Payer: Self-pay | Admitting: *Deleted

## 2017-01-16 ENCOUNTER — Encounter: Payer: Self-pay | Admitting: Hematology

## 2017-01-16 VITALS — BP 185/77 | HR 75 | Temp 98.1°F | Resp 18 | Ht 66.5 in | Wt 157.4 lb

## 2017-01-16 DIAGNOSIS — D509 Iron deficiency anemia, unspecified: Secondary | ICD-10-CM

## 2017-01-16 DIAGNOSIS — R918 Other nonspecific abnormal finding of lung field: Secondary | ICD-10-CM

## 2017-01-16 DIAGNOSIS — C49A2 Gastrointestinal stromal tumor of stomach: Secondary | ICD-10-CM | POA: Diagnosis not present

## 2017-01-16 DIAGNOSIS — D500 Iron deficiency anemia secondary to blood loss (chronic): Secondary | ICD-10-CM

## 2017-01-16 DIAGNOSIS — I10 Essential (primary) hypertension: Secondary | ICD-10-CM

## 2017-01-16 MED FILL — IMATINIB MESYLATE 400 MG TA: 400 | 30 days supply | Qty: 30 | Fill #0

## 2017-01-16 NOTE — Telephone Encounter (Signed)
Scheduled appt per 8/3 los - Gave patient AVS and calender per los.  

## 2017-02-13 MED FILL — IMATINIB MESYLATE 400 MG TA: 400 | 30 days supply | Qty: 30 | Fill #1

## 2017-03-11 ENCOUNTER — Telehealth: Payer: Self-pay | Admitting: Hematology

## 2017-03-11 ENCOUNTER — Other Ambulatory Visit (HOSPITAL_BASED_OUTPATIENT_CLINIC_OR_DEPARTMENT_OTHER): Payer: Medicare Other

## 2017-03-11 ENCOUNTER — Ambulatory Visit (HOSPITAL_BASED_OUTPATIENT_CLINIC_OR_DEPARTMENT_OTHER): Payer: Medicare Other | Admitting: Hematology

## 2017-03-11 ENCOUNTER — Encounter: Payer: Self-pay | Admitting: Hematology

## 2017-03-11 VITALS — BP 194/83 | HR 57 | Temp 98.7°F | Resp 18 | Ht 66.5 in | Wt 161.5 lb

## 2017-03-11 DIAGNOSIS — D500 Iron deficiency anemia secondary to blood loss (chronic): Secondary | ICD-10-CM

## 2017-03-11 DIAGNOSIS — I10 Essential (primary) hypertension: Secondary | ICD-10-CM | POA: Diagnosis not present

## 2017-03-11 DIAGNOSIS — C49A2 Gastrointestinal stromal tumor of stomach: Secondary | ICD-10-CM

## 2017-03-11 DIAGNOSIS — C786 Secondary malignant neoplasm of retroperitoneum and peritoneum: Secondary | ICD-10-CM

## 2017-03-11 DIAGNOSIS — D509 Iron deficiency anemia, unspecified: Secondary | ICD-10-CM

## 2017-03-11 DIAGNOSIS — C787 Secondary malignant neoplasm of liver and intrahepatic bile duct: Secondary | ICD-10-CM

## 2017-03-11 LAB — CBC WITH DIFFERENTIAL/PLATELET
BASO%: 1 % (ref 0.0–2.0)
BASOS ABS: 0 10*3/uL (ref 0.0–0.1)
EOS%: 4.7 % (ref 0.0–7.0)
Eosinophils Absolute: 0.2 10*3/uL (ref 0.0–0.5)
HCT: 37.9 % (ref 34.8–46.6)
HGB: 12.9 g/dL (ref 11.6–15.9)
LYMPH#: 1.7 10*3/uL (ref 0.9–3.3)
LYMPH%: 39.7 % (ref 14.0–49.7)
MCH: 35.9 pg — ABNORMAL HIGH (ref 25.1–34.0)
MCHC: 33.9 g/dL (ref 31.5–36.0)
MCV: 105.7 fL — ABNORMAL HIGH (ref 79.5–101.0)
MONO#: 0.5 10*3/uL (ref 0.1–0.9)
MONO%: 10.9 % (ref 0.0–14.0)
NEUT#: 1.9 10*3/uL (ref 1.5–6.5)
NEUT%: 43.7 % (ref 38.4–76.8)
Platelets: 163 10*3/uL (ref 145–400)
RBC: 3.59 10*6/uL — AB (ref 3.70–5.45)
RDW: 12.4 % (ref 11.2–14.5)
WBC: 4.4 10*3/uL (ref 3.9–10.3)

## 2017-03-11 LAB — COMPREHENSIVE METABOLIC PANEL
ALT: 10 U/L (ref 0–55)
AST: 25 U/L (ref 5–34)
Albumin: 4 g/dL (ref 3.5–5.0)
Alkaline Phosphatase: 66 U/L (ref 40–150)
Anion Gap: 6 mEq/L (ref 3–11)
BUN: 13.1 mg/dL (ref 7.0–26.0)
CALCIUM: 9.3 mg/dL (ref 8.4–10.4)
CHLORIDE: 110 meq/L — AB (ref 98–109)
CO2: 28 mEq/L (ref 22–29)
Creatinine: 1.3 mg/dL — ABNORMAL HIGH (ref 0.6–1.1)
EGFR: 47 mL/min/{1.73_m2} — ABNORMAL LOW (ref >90)
Glucose: 95 mg/dl (ref 70–140)
POTASSIUM: 4.4 meq/L (ref 3.5–5.1)
SODIUM: 144 meq/L (ref 136–145)
Total Bilirubin: 0.44 mg/dL (ref 0.20–1.20)
Total Protein: 7.4 g/dL (ref 6.4–8.3)

## 2017-03-11 MED ORDER — IMATINIB MESYLATE 400 MG PO TABS: ORAL_TABLET | ORAL | 3 refills | Status: DC

## 2017-03-11 MED FILL — IMATINIB MESYLATE 400 MG TA: 400 | 30 days supply | Qty: 30 | Fill #0

## 2017-03-11 NOTE — Progress Notes (Signed)
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      04/09/2016 Initial Biopsy    Gastric mass biopsy showed gastrointestinal stromal tumor, I feel mitosis and necrosis, intermediate to high-grade.      04/09/2016 Imaging    CT chest, abdomen and pelvis with contrast showed a large exophytic mass in the proximal lateral stomach measuring 10 x 7.4 cm, nodular lesions within omentum and multiple liver nodules are highly suspicious for metastatic disease.      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 Today's study demonstrates a positive response to therapy with regression of the primary gastric neoplasm, as well as regression of numerous intraperitoneal  metastatic lesions. Additionally, the liver lesions seen on the prior study appear far more well-defined, decreased in size and are much lower attenuation, suggesting internal areas of necrosis. 2. Several small pulmonary nodules are noted, some of which are new compared to the prior examination. These are nonspecific, but metastatic disease to the lungs is not excluded. Attention on follow-up imaging is recommended. 3. Importantly, there is also widespread ground-glass attenuation and septal thickening in the lungs bilaterally which is new compared to the prior study. In the appropriate clinical setting, this could reflect a drug reaction. Other differential considerations include atypical infection, or even developing interstitial lung disease. Clinical correlation is suggested. 4. Aortic atherosclerosis, in addition to right coronary artery disease. 5. Mild diffuse bronchial wall thickening with mild centrilobular and paraseptal emphysema; imaging findings suggestive of underlying COPD. 6. Additional incidental findings, as above.      09/22/2016 Imaging     CT CAP W CONTRAST   IMPRESSION: 1. Nodular appearing GI stromal tumor along the posterior gastric wall with associated peritoneal implants and hepatic metastases, stable from 07/16/2016. 2. Improving mid and lower lung zone predominant peribronchovascular ground-glass, possibly related to drug therapy. 3. A few scattered pulmonary nodules are stable. 4. Aortic atherosclerosis (ICD10-170.0). Coronary artery calcification.      01/14/2017 Imaging    CT CAP w Contrast  IMPRESSION: 1. Further decrease in size of large GI stromal tumor arising from the greater curvature of the stomach with improvement in hepatic and multifocal peritoneal metastases. 2. Further improvement in lower  lung predominant peribronchovascular ground-glass density, probably inflammatory (drug reaction or or infectious). 3. No acute findings. 4.   Aortic Atherosclerosis (ICD10-I70.0).      History of present illness (04/10/2016):   Haley Mcmillan Cravenis a 83 y.o.femalewith medical history significantfor anemia, arthritis, colonic polyps, hyperlipidemia, hypertension presents to the emergency department after recent GI visit with worsening abdominal pain. She states the pain is intermittent, started about 2 weeks ago, located in the epigastric area, no significant nausea or change of her bowel habits. Her appetite and energy level has decreased lately, although she is able to take care of herself. When the pain resolves, she remains to be active and do house cleaning etc. he has lost about 30 pounds in the past 10-12 months, and the most weight with loss in the past few months. A CT scan showed a large exophytic mass in proximal lateral stomach, measuring 10 x 7.4 cm, and nodular lesions in the omentum, and a multiple liver lesions suspicious for metastatic disease. She underwent EGD by Dr. Simona Huh yesterday, which showed a large gastric tumor in the fundus, biopsy was done, pathology results still pending.  CURRENT THERAPY: Gleevec '400mg'$  daily, started on 04/22/2016  INTERVAL HISTORY:  [name] returns for follow-up. She presents to the clinic today by herself.  She denies stomach issues but she reports that she gets dry mouth but keeps lips moisturized. She notes swelling on her feet especially her right foot. There is no pain with it. She wants to know if she needs cream for her dry skin on her feet. This started the beginning of September. I suggest Vaseline to help with dryness.  Her BP at home is high and was previously on lisinopril but stopped when hospitalized.  Other than bad news she recently got she is doing well overall.    REVIEW OF SYSTEMS:   Constitutional: Denies fevers, chills  (+) appetite good  Eyes: Denies blurriness of vision Ears, nose, mouth, throat, and face: Denies mucositis or sore throat Respiratory: Denies  dyspnea or wheezes  Cardiovascular: Denies palpitation, chest discomfort or lower extremity swelling Gastrointestinal:  Denies nausea, heartburn or change in bowel habits Skin: Denies abnormal skin rashes (+) dry skin and peeling on feet Lymphatics: Denies new lymphadenopathy or easy bruising Neurological:Denies numbness, tingling or new weaknesses Behavioral/Psych: Mood is stable, no new changes  All other systems were reviewed with the patient and are negative.  MEDICAL HISTORY:  Past Medical History:  Diagnosis Date  . Anemia   . Arthritis   . Colon polyps   . Olecranon bursitis    Elbow  . Other and unspecified hyperlipidemia   . Palpitations   . Unspecified essential hypertension     SURGICAL HISTORY: Past Surgical History:  Procedure Laterality Date  . COLONOSCOPY W/ POLYPECTOMY  01/2004  . CRYOTHERAPY     female-cryosurgery stated by pt  . ESOPHAGOGASTRODUODENOSCOPY N/A 04/09/2016   Procedure: ESOPHAGOGASTRODUODENOSCOPY (EGD);  Surgeon: Doran Stabler, MD;  Location: [facility] ENDOSCOPY;  Service: Endoscopy;  Laterality: N/A;  . POLYPECTOMY     from vocal surgery    I have reviewed the social history and family history with the patient and they are unchanged from previous note.  She stopped smoking in 1996 and began smoking sometime when she was a teenager.  ALLERGIES:  has No Known Allergies.  MEDICATIONS:  Current Outpatient Prescriptions  Medication Sig Dispense Refill  . Cholecalciferol (VITAMIN D3) 5000 units CAPS Take 5,000 Units by mouth daily.     Marland Kitchen  Cyanocobalamin (VITAMIN B 12 PO) Take 1 tablet by mouth daily.    Marland Kitchen ENSURE PLUS (ENSURE PLUS) LIQD Take 1 Can by mouth 2 (two) times daily between meals. Uses occasionally    . imatinib (GLEEVEC) 400 MG tablet TAKE 1 TABLET BY MOUTH ONCE DAILY. TAKE WITH MEALS AND LARGE GLASS OF WATER. CAUTION:CHEMOTHERAPY 30 tablet 3   No current facility-administered medications for this visit.     PHYSICAL EXAMINATION:    ECOG PERFORMANCE STATUS: 0  Vitals:   03/11/17 1149  BP: (!) 194/83  Pulse: (!) 57  Resp: 18  Temp: 98.7 F (37.1 C)   Filed Weights   03/11/17 1149  Weight: 161 lb 8 oz (73.3 kg)     GENERAL:alert. SKIN: skin color, texture, turgor are normal, no rashes or significant lesions EYES: normal, Conjunctiva are pink and non-injected, sclera clear OROPHARYNX:no exudate, no erythema and lips, buccal mucosa, and tongue normal  NECK: supple, thyroid normal size, non-tender, without nodularity LYMPH:  no palpable lymphadenopathy in the cervical, axillary or inguinal LUNGS: clear to auscultation and percussion with normal breathing effort HEART: regular rate & rhythm and no murmurs and no lower extremity edema ABDOMEN:abdomen soft, non-tender and normal bowel sounds Musculoskeletal:no cyanosis of digits and no clubbing  NEURO: alert & oriented x 3 with fluent speech, no focal motor/sensory deficits  LABORATORY DATA:  I have reviewed the data as listed CBC Latest Ref Rng & Units 03/11/2017 01/14/2017 11/06/2016  WBC 3.9 - 10.3 10e3/uL 4.4 4.7 4.3  Hemoglobin 11.6 - 15.9 g/dL 12.9 13.3 12.5  Hematocrit 34.8 - 46.6 % 37.9 39.7 37.2  Platelets 145 - 400 10e3/uL 163 173 161     CMP Latest Ref Rng & Units 03/11/2017 01/14/2017 11/06/2016  Glucose 70 - 140 mg/dl 95 89 96  BUN 7.0 - 26.0 mg/dL 13.1 13.2 12.4  Creatinine 0.6 - 1.1 mg/dL 1.3(H) 1.1 1.2(H)  Sodium 136 - 145 mEq/L 144 143 143  Potassium 3.5 - 5.1 mEq/L 4.4 4.1 4.2  Chloride 101 - 111 mmol/L - - -  CO2 22 - 29 mEq/L '28 29 26  '$ Calcium 8.4 - 10.4 mg/dL 9.3 9.7 8.8  Total Protein 6.4 - 8.3 g/dL 7.4 7.5 6.6  Total Bilirubin 0.20 - 1.20 mg/dL 0.44 0.55 0.37  Alkaline Phos 40 - 150 U/L 66 76 69  AST 5 - 34 U/L '25 24 20  '$ ALT 0 - 55 U/L '10 14 11   '$ PATHOLOGY REPORT  Diagnosis 04/09/2016 Stomach, biopsy - GASTROINTESTINAL STROMAL TUMOR (GIST) - SEE COMMENT. Microscopic Comment The biopsy fragments reveal a hypercellular spindle  cell proliferation with cytologic atypia, a few mitoses and necrosis. 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 Immunohistochemical stains were performed revealing that the tumor cells are strongly positive for CD117 and CD34. They are negative for cytokeratin A18, cytokeratin AE1/AE3, desmin, smooth muscle actin, smooth muscle myosin and S100. Dr. Claudette Laws has reviewed the case and concurs with this interpretation. Dr. Annamaria Boots was paged on 04/11/16.   Diagnosis 04/14/2016 Peritoneum, biopsy, Anterior abdomen - GASTROINTESTINAL STROMAL TUMOR (GIST). - SEE COMMENT. Microscopic Comment The tumor is morphologically identical to the patient's previous biopsy ([id number]). Additional studies can be performed upon clinical request. (JBK:gt, 04/15/16)    RADIOGRAPHIC STUDIES: I have personally reviewed the radiological images as listed and agreed with the findings in the report. No results found.   EGD 04/09/2016 Dr. Loletha Carrow  The esophagus was normal. Findings: A large, fungating  and infiltrative, very deeply-ulcerated and necrotic, malignant-appearing mass with stigmata of recent bleeding was found in the gastric fundus. Biopsies were taken with a cold forceps for histology. The duodenal bulb was normal. - Normal esophagus. - Likely malignant gastric tumor in the gastric fundus. extensively biopsied. - Normal duodenal bulb.   CT CAP W CONTRAST  09/22/16 IMPRESSION: 1. Nodular appearing GI stromal tumor along the posterior gastric wall with associated peritoneal implants and hepatic metastases, stable from 07/16/2016. 2. Improving mid and lower lung zone predominant peribronchovascular ground-glass, possibly related to drug therapy. 3. A few scattered pulmonary nodules are stable. 4. Aortic atherosclerosis (ICD10-170.0). Coronary artery calcification.  ASSESSMENT & PLAN:  83 y.o. African-American female, with past  medical history of hypertension, otherwise healthy and active, presented with progressive weight loss, fatigue, anorexia and intermittent epigastric pain.  1. Malignant gastrointestinal stromal tumor (GIST) of stomach, with liver and peritoneal metastasis -I previously reviewed her CT scan findings, gastric mass and peritoneal mass biopsy results, in details with patient and her family members -We previously reviewed the neck to history of GIST, and treatment options. -We previously discussed this is not curable disease, but is very treatable, with mediastinal survival 4-5 years -Her KIT mutation showed exon 11 deletion, which predicts better response to TKI,  I previously reviewed the results in detail with her -She has been tolerating Gleevec very well, no noticeable side effects, except mild anorexia which could be related and occasional cough with phlegm production.  she overall has felt better since she started the [place]. -We previously reviewed her CT scan findings from 07/16/2016, this showed a positive response to therapy with regression of the primary gastric neoplasm, as well as regression of numerous intraperitoneal metastatic lesions. But several small pulmonary nodules were noted, some of which are new compared to the prior examination. Also seen, is widespread mild ground-glass attenuation and septal thickening in the lungs bilaterally which is new compared to the prior study. I'm not sure what the etiology of her pulmonary changes, COPD vs pneumonitis from [place] is possible, she is clinically asymptomatic, I'll continue monitoring. -I previously reviewed her restaging CT scan from 09/22/2016, the image were reviewed in person. She had stable disease in the abdomen, her groundglass changes in bilateral lungs have much improved, no significant evidence of pulmonary metastasis. This is likely related to [place], which has improved, we'll continue monitoring. She is clinically asymptomatic.   - CT scan from 01/14/17 reviewed with pt, which showed continuous improvement in the gastric mass and peritoneal mets, no new lesions. She is tolerating Gleevec 400 mg very well, clinically doing very well, without any symptoms, will continue treatment. -Labs reviewed, CBC and CMP WNL, and the size of her RBC are slightly large but that may be due to Gleevac. Her labs are still adequate to continue treatment.  -I encouraged her to drink plenty of water daily.  - repeat scan in 2 months before next visit   2. Anemia, iron deficiency  -Her anemia has overall improved since she started iron pill. Her initial study will consistent with iron deficiency -Her anemia has resolved after oral iron supplement, so she was reduced to 1 ferrous sulfate tablet a day.  -She has been taking iron pill since hospital and her iron levels have improved overall  3. HTN, uncontrolled  -She was on lisinopril, which was held when she was diagnosed with cancer. I encouraged her to follow up with primary care physician and possibly restart lisinopril  -Her BP  has been persistently high lately, she is asymptomatic.    4. Pulmonary nodules and groundglass change -Found on CT scan in 06/2016. Uncertain etiology. Atypical infection, COPD, vs Gleevec induced pneumonitis are on the differential. She does not have a clinical presentation of pneumonia. Likely related to [place]  -She is clinically asymptomatic, exam was unremarkable.  -repeated CT scan in 09/2016 and 01/2017 showed significant improvement. She remains to be asymptomatic, we'll continue monitoring  6. Dry skin, slight peeling -I suggest she uses Vaseline  And lotion and wear socks to keep her skin from getting dry. From her medication use.    Plan -Refill Gleevac today  -continue Gleevec 400 mg daily, tolerating well. Refilled today -F/u in 2 months with lab and CT CAP with contrast a few days before   -She will contact her primary care physician and restart  lisinopril. I will copy her PCP Dr. Sharlet Salina    All questions were answered. The patient knows to call the clinic with any problems, questions or concerns. No barriers to learning was detected.  I spent 25 minutes counseling the patient face to face. The total time spent in the appointment was 30 minutes and more than 50% was on counseling and review of test results  This document serves as a record of services personally performed by Truitt Merle, MD. It was created on her behalf by Joslyn Devon, a trained medical scribe. The creation of this record is based on the scribe's personal observations and the provider's statements to them. This document has been checked and approved by the attending provider.

## 2017-03-11 NOTE — Telephone Encounter (Signed)
Scheduled appt per 9/26 los - Gave patient AVS and calender per los.  

## 2017-03-17 ENCOUNTER — Ambulatory Visit (INDEPENDENT_AMBULATORY_CARE_PROVIDER_SITE_OTHER): Payer: Medicare Other | Admitting: Internal Medicine

## 2017-03-17 ENCOUNTER — Encounter: Payer: Self-pay | Admitting: Internal Medicine

## 2017-03-17 VITALS — BP 168/88 | HR 84 | Temp 97.9°F | Ht 66.5 in | Wt 163.0 lb

## 2017-03-17 DIAGNOSIS — Z23 Encounter for immunization: Secondary | ICD-10-CM | POA: Diagnosis not present

## 2017-03-17 DIAGNOSIS — I10 Essential (primary) hypertension: Secondary | ICD-10-CM

## 2017-03-17 MED ORDER — LISINOPRIL 20 MG PO TABS: 20.0000 mg | ORAL_TABLET | Freq: Every day | ORAL | 3 refills | Status: DC

## 2017-03-17 NOTE — Assessment & Plan Note (Signed)
BP above goal and has been recently. Will start back on lisinopril 20 mg daily and recheck in about 1 month.

## 2017-03-17 NOTE — Patient Instructions (Signed)
We have sent in the lisinopril to take 1 pill daily. Come back in November to check on the blood pressure.

## 2017-03-17 NOTE — Progress Notes (Signed)
   Subjective:    Patient ID: Haley Mcmillan, female    DOB: 1934-05-22, 83 y.o.   MRN: [id number]  HPI The patient is an 83 YO female coming in for blood pressure follow up. Was increased at her recent oncology visit. She used to be on lisinopril/hctz. She has not needed this since her cancer treatment. Her weight is starting to go back up some. Diet is back to normal. Denies headaches or chest pains or abdominal pain. Denies confusion.   Review of Systems  Constitutional: Negative.   HENT: Negative.   Eyes: Negative.   Respiratory: Negative for cough, chest tightness and shortness of breath.   Cardiovascular: Negative for chest pain, palpitations and leg swelling.  Gastrointestinal: Negative for abdominal distention, abdominal pain, constipation, diarrhea, nausea and vomiting.  Musculoskeletal: Negative.   Skin: Negative.   Neurological: Negative.   Psychiatric/Behavioral: Negative.       Objective:   Physical Exam  Constitutional: She is oriented to person, place, and time. She appears well-developed and well-nourished.  HENT:  Head: Normocephalic and atraumatic.  Eyes: EOM are normal.  Neck: Normal range of motion.  Cardiovascular: Normal rate and regular rhythm.   Pulmonary/Chest: Effort normal and breath sounds normal. No respiratory distress. She has no wheezes. She has no rales.  Abdominal: Soft. Bowel sounds are normal. She exhibits no distension. There is no tenderness. There is no rebound.  Musculoskeletal: She exhibits no edema.  Neurological: She is alert and oriented to person, place, and time. Coordination normal.  Skin: Skin is warm and dry.   Vitals:   03/17/17 1101 03/17/17 1128  BP: (!) 170/90 (!) 168/88  Pulse: 84   Temp: 97.9 F (36.6 C)   TempSrc: Oral   Weight: 163 lb (73.9 kg)   Height: 5' 6.5" (1.689 m)       Assessment & Plan:  Flu shot given at visit

## 2017-03-20 ENCOUNTER — Other Ambulatory Visit: Payer: Self-pay | Admitting: Pharmacist

## 2017-04-07 MED FILL — IMATINIB MESYLATE 400 MG TA: 400 | 30 days supply | Qty: 30 | Fill #1

## 2017-05-04 ENCOUNTER — Telehealth: Payer: Self-pay | Admitting: *Deleted

## 2017-05-04 NOTE — Telephone Encounter (Signed)
"  Haley Mcmillan is my scan and lab appointment?  When do I drink the contrast?   On [phone], NPO after 0700.  Drink contrast at 0900 and 1000.  Lab scheduled at 1000 am.  Scan scheduled at 1100 am.  Arrive at 1045.  Denies further questions or needs at this time.

## 2017-05-06 MED FILL — IMATINIB MESYLATE 400 MG TA: 400 | 30 days supply | Qty: 30 | Fill #2

## 2017-05-08 ENCOUNTER — Other Ambulatory Visit (HOSPITAL_BASED_OUTPATIENT_CLINIC_OR_DEPARTMENT_OTHER): Payer: Medicare Other

## 2017-05-08 ENCOUNTER — Ambulatory Visit (HOSPITAL_COMMUNITY)
Admission: RE | Admit: 2017-05-08 | Discharge: 2017-05-08 | Disposition: A | Discharge status: Home / Self Care | Payer: Medicare Other | Source: Ambulatory Visit | Attending: Hematology | Admitting: Hematology

## 2017-05-08 DIAGNOSIS — I700 Atherosclerosis of aorta: Secondary | ICD-10-CM | POA: Diagnosis not present

## 2017-05-08 DIAGNOSIS — C49A2 Gastrointestinal stromal tumor of stomach: Secondary | ICD-10-CM

## 2017-05-08 DIAGNOSIS — I517 Cardiomegaly: Secondary | ICD-10-CM | POA: Insufficient documentation

## 2017-05-08 DIAGNOSIS — C786 Secondary malignant neoplasm of retroperitoneum and peritoneum: Secondary | ICD-10-CM | POA: Insufficient documentation

## 2017-05-08 DIAGNOSIS — I2510 Atherosclerotic heart disease of native coronary artery without angina pectoris: Secondary | ICD-10-CM | POA: Insufficient documentation

## 2017-05-08 LAB — COMPREHENSIVE METABOLIC PANEL
ALBUMIN: 4.1 g/dL (ref 3.5–5.0)
ALK PHOS: 62 U/L (ref 40–150)
ALT: 9 U/L (ref 0–55)
AST: 20 U/L (ref 5–34)
Anion Gap: 9 mEq/L (ref 3–11)
BUN: 10.9 mg/dL (ref 7.0–26.0)
CALCIUM: 9.3 mg/dL (ref 8.4–10.4)
CHLORIDE: 107 meq/L (ref 98–109)
CO2: 26 mEq/L (ref 22–29)
Creatinine: 1.2 mg/dL — ABNORMAL HIGH (ref 0.6–1.1)
EGFR: 49 mL/min/{1.73_m2} — AB (ref >60)
Glucose: 85 mg/dl (ref 70–140)
POTASSIUM: 4 meq/L (ref 3.5–5.1)
Sodium: 142 mEq/L (ref 136–145)
Total Bilirubin: 0.54 mg/dL (ref 0.20–1.20)
Total Protein: 7.5 g/dL (ref 6.4–8.3)

## 2017-05-08 LAB — CBC WITH DIFFERENTIAL/PLATELET
BASO%: 0.8 % (ref 0.0–2.0)
Basophils Absolute: 0 10*3/uL (ref 0.0–0.1)
EOS%: 3.7 % (ref 0.0–7.0)
Eosinophils Absolute: 0.2 10*3/uL (ref 0.0–0.5)
HCT: 38 % (ref 34.8–46.6)
HEMOGLOBIN: 12.8 g/dL (ref 11.6–15.9)
LYMPH#: 1.6 10*3/uL (ref 0.9–3.3)
LYMPH%: 34.5 % (ref 14.0–49.7)
MCH: 35.6 pg — ABNORMAL HIGH (ref 25.1–34.0)
MCHC: 33.6 g/dL (ref 31.5–36.0)
MCV: 105.8 fL — ABNORMAL HIGH (ref 79.5–101.0)
MONO#: 0.4 10*3/uL (ref 0.1–0.9)
MONO%: 8 % (ref 0.0–14.0)
NEUT%: 53 % (ref 38.4–76.8)
NEUTROS ABS: 2.4 10*3/uL (ref 1.5–6.5)
PLATELETS: 167 10*3/uL (ref 145–400)
RBC: 3.59 10*6/uL — AB (ref 3.70–5.45)
RDW: 12.8 % (ref 11.2–14.5)
WBC: 4.5 10*3/uL (ref 3.9–10.3)

## 2017-05-08 MED ORDER — IOPAMIDOL (ISOVUE-300) INJECTION 61%
100.0000 mL | Freq: Once | INTRAVENOUS | Status: AC | PRN
  Administered 2017-05-08: 100 mL via INTRAVENOUS

## 2017-05-08 MED ORDER — IOPAMIDOL (ISOVUE-300) INJECTION 61%
INTRAVENOUS | Status: AC
  Filled 2017-05-08: qty 100

## 2017-05-11 ENCOUNTER — Other Ambulatory Visit: Payer: Self-pay | Admitting: Emergency Medicine

## 2017-05-11 ENCOUNTER — Telehealth: Payer: Self-pay | Admitting: Nurse Practitioner

## 2017-05-11 ENCOUNTER — Encounter: Payer: Self-pay | Admitting: Nurse Practitioner

## 2017-05-11 ENCOUNTER — Ambulatory Visit (HOSPITAL_BASED_OUTPATIENT_CLINIC_OR_DEPARTMENT_OTHER): Payer: Medicare Other | Admitting: Nurse Practitioner

## 2017-05-11 VITALS — BP 182/72 | HR 66 | Temp 98.0°F | Resp 20 | Ht 66.5 in | Wt 162.8 lb

## 2017-05-11 DIAGNOSIS — C787 Secondary malignant neoplasm of liver and intrahepatic bile duct: Secondary | ICD-10-CM | POA: Diagnosis not present

## 2017-05-11 DIAGNOSIS — I10 Essential (primary) hypertension: Secondary | ICD-10-CM

## 2017-05-11 DIAGNOSIS — C49A2 Gastrointestinal stromal tumor of stomach: Secondary | ICD-10-CM

## 2017-05-11 DIAGNOSIS — C786 Secondary malignant neoplasm of retroperitoneum and peritoneum: Secondary | ICD-10-CM | POA: Diagnosis not present

## 2017-05-11 DIAGNOSIS — I359 Nonrheumatic aortic valve disorder, unspecified: Secondary | ICD-10-CM

## 2017-05-11 DIAGNOSIS — D509 Iron deficiency anemia, unspecified: Secondary | ICD-10-CM | POA: Diagnosis not present

## 2017-05-11 MED ORDER — IMATINIB MESYLATE 400 MG PO TABS: ORAL_TABLET | ORAL | 3 refills | Status: DC

## 2017-05-11 NOTE — Telephone Encounter (Signed)
Gave avs and calendar for January 2019 °

## 2017-05-11 NOTE — Progress Notes (Addendum)
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      04/09/2016 Initial Biopsy    Gastric mass biopsy showed gastrointestinal stromal tumor, I feel mitosis and necrosis, intermediate to high-grade.      04/09/2016 Imaging    CT chest, abdomen and pelvis with contrast showed a large exophytic mass in the proximal lateral stomach measuring 10 x 7.4 cm, nodular lesions within omentum and multiple liver nodules are highly suspicious for metastatic disease.      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 Today's study demonstrates a positive response to therapy with regression of the primary gastric neoplasm, as well as regression of numerous intraperitoneal  metastatic lesions. Additionally, the liver lesions seen on the prior study appear far more well-defined, decreased in size and are much lower attenuation, suggesting internal areas of necrosis. 2. Several small pulmonary nodules are noted, some of which are new compared to the prior examination. These are nonspecific, but metastatic disease to the lungs is not excluded. Attention on follow-up imaging is recommended. 3. Importantly, there is also widespread ground-glass attenuation and septal thickening in the lungs bilaterally which is new compared to the prior study. In the appropriate clinical setting, this could reflect a drug reaction. Other differential considerations include atypical infection, or even developing interstitial lung disease. Clinical correlation is suggested. 4. Aortic atherosclerosis, in addition to right coronary artery disease. 5. Mild diffuse bronchial wall thickening with mild centrilobular and paraseptal emphysema; imaging findings suggestive of underlying COPD. 6. Additional incidental findings, as above.      09/22/2016 Imaging     CT CAP W CONTRAST   IMPRESSION: 1. Nodular appearing GI stromal tumor along the posterior gastric wall with associated peritoneal implants and hepatic metastases, stable from 07/16/2016. 2. Improving mid and lower lung zone predominant peribronchovascular ground-glass, possibly related to drug therapy. 3. A few scattered pulmonary nodules are stable. 4. Aortic atherosclerosis (ICD10-170.0). Coronary artery calcification.      01/14/2017 Imaging    CT CAP w Contrast  IMPRESSION: 1. Further decrease in size of large GI stromal tumor arising from the greater curvature of the stomach with improvement in hepatic and multifocal peritoneal metastases. 2. Further improvement in lower  lung predominant peribronchovascular ground-glass density, probably inflammatory (drug reaction or or infectious). 3. No acute findings. 4.   Aortic Atherosclerosis (ICD10-I70.0).      05/08/2017 Imaging    IMPRESSION: 1. Overall, today's examination is very similar to recent prior studies again demonstrating a large proximal gastric mass with adjacent gastrosplenic ligament lymphadenopathy, multiple treated lesions in the liver, and other intraperitoneal metastases, as discussed above. No definite new sites of metastatic disease are otherwise noted. 2. Aortic atherosclerosis, in addition to right coronary artery disease. 3. Mild cardiomegaly. 4. There are calcifications of the aortic valve. Echocardiographic correlation for evaluation of potential valvular dysfunction may be warranted if clinically indicated. 5. Additional incidental findings, as above. Aortic Atherosclerosis (ICD10-I70.0).     CURRENT THERAPY: Gleevec 497m daily, started on 04/22/2016  INTERVAL HISTORY: Ms. CSelbereturns today for follow-up as scheduled.  She feels well with good appetite and no fatigue.  She began exercising at the gym early in November but then took a 2-week break after becoming concerned she had not consulted as first.  She was tolerating walking laps and pedaling 2 times per week well without symptoms.  She denies abdominal pain or bloating.  Bowels are loose but not watery, BM once daily.  She restarted lisinopril but not taking consistently due to forgetting when to take it; has questions about when she can take this medication.  She is not drinking ensure anymore, eating protein in her diet instead. Otherwise she has no concerns today.  REVIEW OF SYSTEMS:   Constitutional: Denies fatigue, fevers, chills or abnormal weight loss Eyes: Denies blurriness of vision Ears, nose, mouth, throat, and face: Denies mucositis or sore throat Respiratory: Denies cough, dyspnea or wheezes Cardiovascular: Denies palpitation, chest discomfort or lower extremity swelling Gastrointestinal:  Denies abdominal pain, bloating, nausea, vomiting,  constipation, diarrhea, heartburn or change in bowel habits (+) 1 loose BM daily Skin: Denies abnormal skin rashes or dryness Lymphatics: Denies new lymphadenopathy or easy bruising Neurological:Denies numbness, tingling, lightheadedness, dizziness, or new weaknesses Behavioral/Psych: Mood is stable, no new changes  All other systems were reviewed with the patient and are negative.  MEDICAL HISTORY:  Past Medical History:  Diagnosis Date  . Anemia   . Arthritis   . Colon polyps   . Olecranon bursitis    Elbow  . Other and unspecified hyperlipidemia   . Palpitations   . Unspecified essential hypertension     SURGICAL HISTORY: Past Surgical History:  Procedure Laterality Date  . COLONOSCOPY W/ POLYPECTOMY  01/2004  . CRYOTHERAPY     female-cryosurgery stated by pt  . ESOPHAGOGASTRODUODENOSCOPY N/A 04/09/2016   Procedure: ESOPHAGOGASTRODUODENOSCOPY (EGD);  Surgeon: HDoran Stabler MD;  [facility];  Service: Endoscopy;  Laterality: N/A;  . POLYPECTOMY     from vocal surgery    I have reviewed the social history and family history with the patient and they are unchanged from previous note.  ALLERGIES:  has No Known Allergies.  MEDICATIONS:  Current Outpatient Medications  Medication Sig Dispense Refill  . Cholecalciferol (VITAMIN D3) 5000 units CAPS Take 5,000 Units by mouth daily.     . Cyanocobalamin (VITAMIN B 12 PO) Take 1 tablet by mouth daily.    .Marland Kitchenimatinib (GLEEVEC) 400 MG tablet TAKE 1 TABLET BY MOUTH ONCE DAILY. TAKE WITH MEALS AND LARGE GLASS OF WATER. CAUTION:CHEMOTHERAPY 30 tablet 3  . lisinopril (PRINIVIL,ZESTRIL) 20 MG tablet Take 1 tablet (20 mg total) by mouth daily. 90 tablet 3  . ENSURE  PLUS (ENSURE PLUS) LIQD Take 1 Can by mouth 2 (two) times daily between meals. Uses occasionally     No current facility-administered medications for this visit.     PHYSICAL EXAMINATION: ECOG PERFORMANCE STATUS: 1 - Symptomatic but completely  ambulatory  Vitals:   05/11/17 1019  BP: (!) 182/72  Pulse: 66  Resp: 20  Temp: 98 F (36.7 C)  SpO2: 98%   Filed Weights   05/11/17 1019  Weight: 162 lb 12.8 oz (73.8 kg)    GENERAL:alert, no distress and comfortable SKIN: skin color, texture, turgor are normal, no rashes or significant lesions EYES: normal, Conjunctiva are pink and non-injected, sclera clear OROPHARYNX:no exudate, no erythema and lips, buccal mucosa, and tongue normal  NECK: supple, thyroid normal size, non-tender, without nodularity LYMPH:  no palpable cervical, supraclavicular or axillary lymphadenopathy LUNGS: clear to auscultation bilaterally with normal breathing effort HEART: regular rate & rhythm and no murmurs and no lower extremity edema ABDOMEN:abdomen soft, non-tender and normal bowel sounds. No palpable hepatomegaly.  Musculoskeletal:no cyanosis of digits and no clubbing  NEURO: alert & oriented x 3 with fluent speech, no focal motor/sensory deficits  LABORATORY DATA:  I have reviewed the data as listed CBC Latest Ref Rng & Units 05/08/2017 03/11/2017 01/14/2017  WBC 3.9 - 10.3 10e3/uL 4.5 4.4 4.7  Hemoglobin 11.6 - 15.9 g/dL 12.8 12.9 13.3  Hematocrit 34.8 - 46.6 % 38.0 37.9 39.7  Platelets 145 - 400 10e3/uL 167 163 173    CMP Latest Ref Rng & Units 05/08/2017 03/11/2017 01/14/2017  Glucose 70 - 140 mg/dl 85 95 89  BUN 7.0 - 26.0 mg/dL 10.9 13.1 13.2  Creatinine 0.6 - 1.1 mg/dL 1.2(H) 1.3(H) 1.1  Sodium 136 - 145 mEq/L 142 144 143  Potassium 3.5 - 5.1 mEq/L 4.0 4.4 4.1  Chloride 101 - 111 mmol/L - - -  CO2 22 - 29 mEq/L _0 Calcium 8.4 - 10.4 mg/dL 9.3 9.3 9.7  Total Protein 6.4 - 8.3 g/dL 7.5 7.4 7.5  Total Bilirubin 0.20 - 1.20 mg/dL 0.54 0.44 0.55  Alkaline Phos 40 - 150 U/L 62 66 76  AST 5 - 34 U/L _1 ALT 0 - 55 U/L _2 RADIOGRAPHIC STUDIES: I have personally reviewed the radiological images as listed and agreed with the findings in the report. No results found.    05/08/17 CT CAP IMPRESSION: 1. Overall, today's examination is very similar to recent prior studies again demonstrating a large proximal gastric mass with adjacent gastrosplenic ligament lymphadenopathy, multiple treated lesions in the liver, and other intraperitoneal metastases, as discussed above. No definite new sites of metastatic disease are otherwise noted. 2. Aortic atherosclerosis, in addition to right coronary artery disease. 3. Mild cardiomegaly. 4. There are calcifications of the aortic valve. Echocardiographic correlation for evaluation of potential valvular dysfunction may be warranted if clinically indicated. 5. Additional incidental findings, as above. Aortic Atherosclerosis (ICD10-I70.0).   ASSESSMENT & PLAN: 83 y.o. African-American female, with past medical history of hypertension, otherwise healthy and active, presented with progressive weight loss, fatigue, anorexia and intermittent epigastric pain.  1. Malignant gastrointestinal stromal tumor (GIST) of stomach, with liver and peritoneal metastasis 2. Anemia, iron deficiency 3. HTN, uncontrolled 4. Pulmonary nodules and groundglass change 5. Dry skin, slight peeling  Ms Marzo appears stable today, tolerating gleevec 400 mg daily well overall.  CT chest abdomen pelvis shows stable disease, large proximal gastric mass with adjacent gastrosplenic ligament lymphadenopathy, multiple  treated liver lesions, and other intraperitoneal metastases.  No definite new sites of metastatic disease are noted.  As an incidental finding of mild cardiomegaly and calcifications in the aortic valve.  She is asymptomatic.  Will follow up with an echocardiogram to be done within a month, she will follow-up with PCP and I will copy my note.  She has tolerated recent exercise well, she can continue walking and pedaling at the gym several times weekly.  I recommend she take lisinopril daily in the morning with a.m. meds, she agrees. CBC and  Cmet, physical exam unremarkable. She will continue Gleevec daily in the evening with largest meal of the day.  Return in 2 months for lab and f/u with Dr. Burr Medico  PLAN: Continue gleevec 400 mg daily, refilled today [facility] within 1 month CC my note to PCP to determine whether cardiology referral is appropriate Return in 2 months for lab and f/u with Dr. Burr Medico   All questions were answered. The patient knows to call the clinic with any problems, questions or concerns. No barriers to learning was detected.     Alla Feeling, NP 05/11/17   Addendum  I have seen the patient, examined her. I agree with the assessment and and plan and have edited the notes.   Ms Gitto is clinically doing very well with no symptoms. I reviewed her scan findings, overall stable disease. Will continue Gleevec. I recommend her to have a ECHO to evaluate her cardiac findings on CT. Will see her back in 2 months.   Truitt Merle  05/11/2017

## 2017-05-12 ENCOUNTER — Encounter: Payer: Self-pay | Admitting: Internal Medicine

## 2017-05-12 ENCOUNTER — Ambulatory Visit (INDEPENDENT_AMBULATORY_CARE_PROVIDER_SITE_OTHER): Payer: Medicare Other | Admitting: Internal Medicine

## 2017-05-12 ENCOUNTER — Ambulatory Visit: Payer: Medicare Other | Admitting: Internal Medicine

## 2017-05-12 DIAGNOSIS — I10 Essential (primary) hypertension: Secondary | ICD-10-CM | POA: Diagnosis not present

## 2017-05-12 DIAGNOSIS — I700 Atherosclerosis of aorta: Secondary | ICD-10-CM | POA: Diagnosis not present

## 2017-05-12 NOTE — Assessment & Plan Note (Signed)
Noted on recent CT chest. Oncology has ordered echo to evaluate although this will not evaluate plaque burden. She is without symptoms at this time does not want to pursue stress testing.

## 2017-05-12 NOTE — Assessment & Plan Note (Signed)
She is asked to start taking lisinopril daily and return for visit to evaluate treatment. Recent CMP without indication for change. Follow up and adjust as needed.

## 2017-05-12 NOTE — Patient Instructions (Signed)
We will keep you on the lisinopril and it is okay to take with the gleevec.

## 2017-05-12 NOTE — Progress Notes (Signed)
   Subjective:    Patient ID: Haley Mcmillan, female    DOB: 11-Jan-1934, 83 y.o.   MRN: [id number]  HPI The patient is an 83 YO female coming in for blood pressure follow up. Started on lisinopril 20 mg daily about 1-2 months ago but she could not remember to take it. She denies headaches, chest pains, nausea or vomiting or stomach pain. She is very worried this week about her recent oncology visit with her recent scan and labs she was very convinced that there would be something wrong. She did not take lisinopril yet today.  Review of Systems  Constitutional: Negative.   Respiratory: Negative for cough, chest tightness and shortness of breath.   Cardiovascular: Negative for chest pain, palpitations and leg swelling.  Gastrointestinal: Negative for abdominal distention, abdominal pain, constipation, diarrhea, nausea and vomiting.  Musculoskeletal: Negative.   Skin: Negative.   Neurological: Negative.   Psychiatric/Behavioral: Negative.       Objective:   Physical Exam  Constitutional: She is oriented to person, place, and time. She appears well-developed and well-nourished.  HENT:  Head: Normocephalic and atraumatic.  Eyes: EOM are normal.  Neck: Normal range of motion.  Cardiovascular: Normal rate and regular rhythm.  Pulmonary/Chest: Effort normal and breath sounds normal. No respiratory distress. She has no wheezes. She has no rales.  Abdominal: Soft.  Musculoskeletal: She exhibits no edema.  Neurological: She is alert and oriented to person, place, and time. Coordination normal.  Skin: Skin is warm and dry.  Psychiatric: She has a normal mood and affect.   Vitals:   05/12/17 1024 05/12/17 1053  BP: (!) 160/80 (!) 164/80  Pulse: 67   Temp: 98.4 F (36.9 C)   TempSrc: Oral   SpO2: 98%   Weight: 162 lb (73.5 kg)   Height: 5' 6.5" (1.689 m)       Assessment & Plan:

## 2017-06-02 ENCOUNTER — Ambulatory Visit (HOSPITAL_COMMUNITY)
Admission: RE | Admit: 2017-06-02 | Discharge: 2017-06-02 | Disposition: A | Discharge status: Home / Self Care | Payer: Medicare Other | Source: Ambulatory Visit | Attending: Nurse Practitioner | Admitting: Nurse Practitioner

## 2017-06-02 DIAGNOSIS — I359 Nonrheumatic aortic valve disorder, unspecified: Secondary | ICD-10-CM | POA: Diagnosis not present

## 2017-06-02 DIAGNOSIS — R1110 Vomiting, unspecified: Secondary | ICD-10-CM | POA: Diagnosis not present

## 2017-06-02 DIAGNOSIS — I10 Essential (primary) hypertension: Secondary | ICD-10-CM | POA: Diagnosis not present

## 2017-06-02 DIAGNOSIS — E785 Hyperlipidemia, unspecified: Secondary | ICD-10-CM | POA: Insufficient documentation

## 2017-06-02 NOTE — Progress Notes (Signed)
  Echocardiogram 2D Echocardiogram has been performed.  Darlina Sicilian M 06/02/2017, 11:29 AM

## 2017-06-03 MED FILL — IMATINIB MESYLATE 400 MG TA: 400 | 30 days supply | Qty: 30 | Fill #2

## 2017-07-10 ENCOUNTER — Inpatient Hospital Stay: Payer: Medicare Other | Attending: Nurse Practitioner | Admitting: Nurse Practitioner

## 2017-07-10 ENCOUNTER — Telehealth: Payer: Self-pay | Admitting: Hematology

## 2017-07-10 ENCOUNTER — Encounter: Payer: Self-pay | Admitting: Nurse Practitioner

## 2017-07-10 ENCOUNTER — Inpatient Hospital Stay: Payer: Medicare Other

## 2017-07-10 VITALS — BP 176/70 | HR 67 | Temp 98.5°F | Resp 17 | Ht 66.5 in | Wt 162.8 lb

## 2017-07-10 DIAGNOSIS — J449 Chronic obstructive pulmonary disease, unspecified: Secondary | ICD-10-CM | POA: Diagnosis not present

## 2017-07-10 DIAGNOSIS — C786 Secondary malignant neoplasm of retroperitoneum and peritoneum: Secondary | ICD-10-CM | POA: Insufficient documentation

## 2017-07-10 DIAGNOSIS — I700 Atherosclerosis of aorta: Secondary | ICD-10-CM | POA: Diagnosis not present

## 2017-07-10 DIAGNOSIS — Z86010 Personal history of colonic polyps: Secondary | ICD-10-CM | POA: Insufficient documentation

## 2017-07-10 DIAGNOSIS — C787 Secondary malignant neoplasm of liver and intrahepatic bile duct: Secondary | ICD-10-CM | POA: Diagnosis not present

## 2017-07-10 DIAGNOSIS — C49A2 Gastrointestinal stromal tumor of stomach: Secondary | ICD-10-CM | POA: Insufficient documentation

## 2017-07-10 DIAGNOSIS — Z79899 Other long term (current) drug therapy: Secondary | ICD-10-CM | POA: Diagnosis not present

## 2017-07-10 DIAGNOSIS — E785 Hyperlipidemia, unspecified: Secondary | ICD-10-CM | POA: Diagnosis not present

## 2017-07-10 DIAGNOSIS — I10 Essential (primary) hypertension: Secondary | ICD-10-CM | POA: Insufficient documentation

## 2017-07-10 DIAGNOSIS — I359 Nonrheumatic aortic valve disorder, unspecified: Secondary | ICD-10-CM

## 2017-07-10 LAB — CBC WITH DIFFERENTIAL/PLATELET
BASOS PCT: 0 %
Basophils Absolute: 0 10*3/uL (ref 0.0–0.1)
EOS PCT: 4 %
Eosinophils Absolute: 0.2 10*3/uL (ref 0.0–0.5)
HCT: 37 % (ref 34.8–46.6)
Hemoglobin: 12.2 g/dL (ref 11.6–15.9)
Lymphocytes Relative: 35 %
Lymphs Abs: 1.6 10*3/uL (ref 0.9–3.3)
MCH: 35.1 pg — ABNORMAL HIGH (ref 25.1–34.0)
MCHC: 33 g/dL (ref 31.5–36.0)
MCV: 106.3 fL — ABNORMAL HIGH (ref 79.5–101.0)
MONO ABS: 0.4 10*3/uL (ref 0.1–0.9)
Monocytes Relative: 9 %
NEUTROS ABS: 2.4 10*3/uL (ref 1.5–6.5)
Neutrophils Relative %: 52 %
PLATELETS: 147 10*3/uL (ref 145–400)
RBC: 3.48 MIL/uL — ABNORMAL LOW (ref 3.70–5.45)
RDW: 12.6 % (ref 11.2–16.1)
WBC: 4.7 10*3/uL (ref 3.9–10.3)

## 2017-07-10 LAB — COMPREHENSIVE METABOLIC PANEL
ALK PHOS: 68 U/L (ref 40–150)
ALT: 8 U/L (ref 0–55)
ANION GAP: 8 (ref 3–11)
AST: 20 U/L (ref 5–34)
Albumin: 3.9 g/dL (ref 3.5–5.0)
BILIRUBIN TOTAL: 0.3 mg/dL (ref 0.2–1.2)
BUN: 10 mg/dL (ref 7–26)
CALCIUM: 8.9 mg/dL (ref 8.4–10.4)
CO2: 27 mmol/L (ref 22–29)
Chloride: 109 mmol/L (ref 98–109)
Creatinine, Ser: 1.25 mg/dL — ABNORMAL HIGH (ref 0.60–1.10)
GFR calc Af Amer: 45 mL/min — ABNORMAL LOW (ref >60)
GFR, EST NON AFRICAN AMERICAN: 39 mL/min — AB (ref >60)
GLUCOSE: 88 mg/dL (ref 70–140)
Potassium: 4.1 mmol/L (ref 3.3–4.7)
Sodium: 144 mmol/L (ref 136–145)
TOTAL PROTEIN: 7.1 g/dL (ref 6.4–8.3)

## 2017-07-10 MED ORDER — IMATINIB MESYLATE 400 MG PO TABS: ORAL_TABLET | ORAL | 3 refills | Status: DC

## 2017-07-10 NOTE — Progress Notes (Signed)
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      04/09/2016 Initial Biopsy    Gastric mass biopsy showed gastrointestinal stromal tumor, I feel mitosis and necrosis, intermediate to high-grade.      04/09/2016 Imaging    CT chest, abdomen and pelvis with contrast showed a large exophytic mass in the proximal lateral stomach measuring 10 x 7.4 cm, nodular lesions within omentum and multiple liver nodules are highly suspicious for metastatic disease.      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 Today's study demonstrates a positive response to therapy with regression of the primary gastric neoplasm, as well as regression of numerous intraperitoneal  metastatic lesions. Additionally, the liver lesions seen on the prior study appear far more well-defined, decreased in size and are much lower attenuation, suggesting internal areas of necrosis. 2. Several small pulmonary nodules are noted, some of which are new compared to the prior examination. These are nonspecific, but metastatic disease to the lungs is not excluded. Attention on follow-up imaging is recommended. 3. Importantly, there is also widespread ground-glass attenuation and septal thickening in the lungs bilaterally which is new compared to the prior study. In the appropriate clinical setting, this could reflect a drug reaction. Other differential considerations include atypical infection, or even developing interstitial lung disease. Clinical correlation is suggested. 4. Aortic atherosclerosis, in addition to right coronary artery disease. 5. Mild diffuse bronchial wall thickening with mild centrilobular and paraseptal emphysema; imaging findings suggestive of underlying COPD. 6. Additional incidental findings, as above.      09/22/2016 Imaging     CT CAP W CONTRAST   IMPRESSION: 1. Nodular appearing GI stromal tumor along the posterior gastric wall with associated peritoneal implants and hepatic metastases, stable from 07/16/2016. 2. Improving mid and lower lung zone predominant peribronchovascular ground-glass, possibly related to drug therapy. 3. A few scattered pulmonary nodules are stable. 4. Aortic atherosclerosis (ICD10-170.0). Coronary artery calcification.      01/14/2017 Imaging    CT CAP w Contrast  IMPRESSION: 1. Further decrease in size of large GI stromal tumor arising from the greater curvature of the stomach with improvement in hepatic and multifocal peritoneal metastases. 2. Further improvement in lower  lung predominant peribronchovascular ground-glass density, probably inflammatory (drug reaction or or infectious). 3. No acute findings. 4.   Aortic Atherosclerosis (ICD10-I70.0).      05/08/2017 Imaging    IMPRESSION: 1. Overall, today's examination is very similar to recent prior studies again demonstrating a large proximal gastric mass with adjacent gastrosplenic ligament lymphadenopathy, multiple treated lesions in the liver, and other intraperitoneal metastases, as discussed above. No definite new sites of metastatic disease are otherwise noted. 2. Aortic atherosclerosis, in addition to right coronary artery disease. 3. Mild cardiomegaly. 4. There are calcifications of the aortic valve. Echocardiographic correlation for evaluation of potential valvular dysfunction may be warranted if clinically indicated. 5. Additional incidental findings, as above. Aortic Atherosclerosis (ICD10-I70.0).     CURRENT THERAPY: Gleevec 453m daily, started on 04/22/2016  INTERVAL HISTORY: Ms. CBerreyreturns for follow up as scheduled. Has been doing well in the interim but not sleeping well. Her husband is out of the country and she can't communicate with him; does not know when he is coming home so she doesn't set her alarm at night which causes her stress and anxiety and therefore is not sleeping well. She has stopped exercising to catch up on sleep during the day but continues to bowl regularly. Not taking lisinopril consistently, she forgets to take it some days. Denies other changes in her health recently. No abdominal pain, bloating, n/v/c/d. Eating well but not drinking enough. Has daytime fatigue due to not sleeping well. No fever or chills, cough, dyspnea, palpitations, chest pain, or lower extremity edema. Continues Gleevec daily, no complaints.   REVIEW OF SYSTEMS:   Constitutional: Denies fevers, chills or abnormal weight loss (+) daytime fatigue (+) eating well, not drinking enough water Eyes: Denies blurriness of vision Ears, nose, mouth, throat, and face: Denies mucositis or sore throat Respiratory: Denies cough, dyspnea or  wheezes Cardiovascular: Denies palpitation, chest discomfort or lower extremity swelling Gastrointestinal:  Denies abdominal pain, bloating, nausea, vomiting, constipation, diarrhea, heartburn or change in bowel habits  Skin: Denies abnormal skin rashes or dryness Lymphatics: Denies new lymphadenopathy or easy bruising Neurological:Denies numbness, tingling, lightheadedness, dizziness, or new weaknesses Behavioral/Psych: (+) mild anxiety (+) stress   All other systems were reviewed with the patient and are negative.  MEDICAL HISTORY:  Past Medical History:  Diagnosis Date  . Anemia   . Arthritis   . Colon polyps   . Olecranon bursitis    Elbow  . Other and unspecified hyperlipidemia   . Palpitations   . Unspecified essential hypertension     SURGICAL HISTORY: Past Surgical History:  Procedure Laterality Date  . COLONOSCOPY W/ POLYPECTOMY  01/2004  . CRYOTHERAPY     female-cryosurgery stated by pt  . ESOPHAGOGASTRODUODENOSCOPY N/A 04/09/2016   Procedure: ESOPHAGOGASTRODUODENOSCOPY (EGD);  Surgeon: HDoran Stabler MD;  [facility];  Service: Endoscopy;  Laterality: N/A;  . POLYPECTOMY     from vocal surgery    I have reviewed the social history and family history with the patient and they are unchanged from previous note.  ALLERGIES:  has No Known Allergies.  MEDICATIONS:  Current Outpatient Medications  Medication Sig Dispense Refill  . Cholecalciferol (VITAMIN D3) 5000 units CAPS Take 5,000 Units by mouth daily.     . Cyanocobalamin (VITAMIN B 12 PO) Take 1 tablet by mouth daily.    .Marland Kitchenimatinib (GLEEVEC) 400 MG tablet TAKE 1 TABLET BY MOUTH ONCE DAILY. TAKE WITH MEALS AND LARGE GLASS OF WATER. CAUTION:CHEMOTHERAPY 30 tablet 3  .  lisinopril (PRINIVIL,ZESTRIL) 20 MG tablet Take 1 tablet (20 mg total) by mouth daily. 90 tablet 3   No current facility-administered medications for this visit.     PHYSICAL EXAMINATION: ECOG PERFORMANCE STATUS: 1 - Symptomatic  but completely ambulatory  Vitals:   07/10/17 1100 07/10/17 1104  BP: (!) 182/70 (!) 176/70  Pulse: 67   Resp: 17   Temp: 98.5 F (36.9 C)   SpO2: 96%    Filed Weights   07/10/17 1100  Weight: 162 lb 12.8 oz (73.8 kg)    GENERAL:alert, no distress and comfortable SKIN: skin color, texture, turgor are normal, no rashes or significant lesions EYES: normal, Conjunctiva are pink and non-injected, sclera clear OROPHARYNX:no exudate, no erythema and lips, buccal mucosa, and tongue normal  NECK: supple, thyroid normal size, non-tender, without nodularity LYMPH:  no palpable cervical, supraclavicular, axillary, or inguinal lymphadenopathy LUNGS: clear to auscultation bilaterally with normal breathing effort HEART: regular rate & rhythm and no murmurs and no lower extremity edema ABDOMEN:abdomen soft, non-tender and normal bowel sounds. No palpable hepatomegaly.  Musculoskeletal:no cyanosis of digits and no clubbing  NEURO: alert & oriented x 3 with fluent speech, no focal motor/sensory deficits  LABORATORY DATA:  I have reviewed the data as listed CBC Latest Ref Rng & Units 07/10/2017 05/08/2017 03/11/2017  WBC 3.9 - 10.3 K/uL 4.7 4.5 4.4  Hemoglobin 11.6 - 15.9 g/dL 12.2 12.8 12.9  Hematocrit 34.8 - 46.6 % 37.0 38.0 37.9  Platelets 145 - 400 K/uL 147 167 163    CMP Latest Ref Rng & Units 07/10/2017 05/08/2017 03/11/2017  Glucose 70 - 140 mg/dL 88 85 95  BUN 7 - 26 mg/dL 10 10.9 13.1  Creatinine 0.60 - 1.10 mg/dL 1.25(H) 1.2(H) 1.3(H)  Sodium 136 - 145 mmol/L 144 142 144  Potassium 3.3 - 4.7 mmol/L 4.1 4.0 4.4  Chloride 98 - 109 mmol/L 109 - -  CO2 22 - 29 mmol/L _0 Calcium 8.4 - 10.4 mg/dL 8.9 9.3 9.3  Total Protein 6.4 - 8.3 g/dL 7.1 7.5 7.4  Total Bilirubin 0.2 - 1.2 mg/dL 0.3 0.54 0.44  Alkaline Phos 40 - 150 U/L 68 62 66  AST 5 - 34 U/L _1 ALT 0 - 55 U/L _2 RADIOGRAPHIC STUDIES: I have personally reviewed the radiological images as listed and agreed  with the findings in the report. No results found.   05/08/17 CT CAP IMPRESSION: 1. Overall, today's examination is very similar to recent prior studies again demonstrating a large proximal gastric mass with adjacent gastrosplenic ligament lymphadenopathy, multiple treated lesions in the liver, and other intraperitoneal metastases, as discussed above. No definite new sites of metastatic disease are otherwise noted. 2. Aortic atherosclerosis, in addition to right coronary artery disease. 3. Mild cardiomegaly. 4. There are calcifications of the aortic valve. Echocardiographic correlation for evaluation of potential valvular dysfunction may be warranted if clinically indicated. 5. Additional incidental findings, as above. Aortic Atherosclerosis (ICD10-I70.0).   ASSESSMENT & PLAN: 84 y.o. African-American female, with past medical history of hypertension, otherwise healthy and active, presented with progressive weight loss, fatigue, anorexia and intermittent epigastric pain.  1. Malignant gastrointestinal stromal tumor (GIST) of stomach, with liver and peritoneal metastasis 2. Anemia, iron deficiency 3. HTN, uncontrolled 4. Pulmonary nodules and groundglass change 5. Dry skin, peeling 6. Aortic valve calcification  Ms. Kang appears stable today. She continues Gleevec daily without difficulty. Last imaging 04/2017 shows stable disease. She is asymptomatic.  Will continue daily Gleevec, refilled today, and repeat imaging in 2 months. Labs reviewed, CBC stable, creatinine elevated but stable, Cr 1.25. I suggest she increase water intake and take lisinopril daily to control BP. I recommend stress management with exercise. She plans to live with her daughter for a while to help with her anxiety while her husband is away, I encouraged this. Echo with EF 60-65%, she did not want to pursue stress test for aortic calcifications. I recommend routine f/u with PCP and return to [place] in 2 months few  days after CT CAP, ordered today.   PLAN: -Labs reviewed, continue daily Gleevec 400 mg, refilled today -CT CAP in 2 months, f/u few days after -Continue close f/u with PCP for BP and other comorbids   All questions were answered. The patient knows to call the clinic with any problems, questions or concerns. No barriers to learning was detected.     Haley Feeling, NP 07/10/17

## 2017-07-10 NOTE — Telephone Encounter (Signed)
Scheduled appt per 12/5 los - Gave patient AVS and calender per los.   

## 2017-07-13 MED FILL — IMATINIB MESYLATE 400 MG TA: 400 | 30 days supply | Qty: 30 | Fill #0

## 2017-08-12 ENCOUNTER — Encounter: Payer: Self-pay | Admitting: Internal Medicine

## 2017-08-12 ENCOUNTER — Ambulatory Visit (INDEPENDENT_AMBULATORY_CARE_PROVIDER_SITE_OTHER): Payer: Medicare Other | Admitting: Internal Medicine

## 2017-08-12 DIAGNOSIS — I10 Essential (primary) hypertension: Secondary | ICD-10-CM | POA: Diagnosis not present

## 2017-08-12 DIAGNOSIS — R238 Other skin changes: Secondary | ICD-10-CM

## 2017-08-12 DIAGNOSIS — L659 Nonscarring hair loss, unspecified: Secondary | ICD-10-CM | POA: Insufficient documentation

## 2017-08-12 MED ORDER — SELENIUM SULFIDE 2.25 % EX SHAM: 10.0000 mL | MEDICATED_SHAMPOO | CUTANEOUS | 6 refills | Status: DC

## 2017-08-12 MED FILL — IMATINIB MESYLATE 400 MG TA: 400 | 30 days supply | Qty: 30 | Fill #1

## 2017-08-12 NOTE — Progress Notes (Signed)
   Subjective:    Patient ID: Haley Mcmillan, female    DOB: [birth year], 84 y.o.   MRN: [id number]  HPI The patient is an 84 YO female coming in for follow up of blood pressure. She has started lisinopril daily since our last visit after reassurance it was okay with gleevec). She has been taking this regularly now with good blood pressure. She denies headaches or migraines. Is having some red and dry scalp for the last 5 weeks or so. 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 She has used some olive oil on it the last couple of days.   Review of Systems  Constitutional: Negative.   HENT: Negative.   Eyes: Negative.   Respiratory: Negative for cough, chest tightness and shortness of breath.   Cardiovascular: Negative for chest pain, palpitations and leg swelling.  Gastrointestinal: Negative for abdominal distention, abdominal pain, constipation, diarrhea, nausea and vomiting.  Musculoskeletal: Negative.   Skin: Negative.   Neurological: Negative.   Psychiatric/Behavioral: Negative.       Objective:   Physical Exam  Constitutional: She is oriented to person, place, and time. She appears well-developed and well-nourished.  HENT:  Head: Normocephalic and atraumatic.  Scalp with redness and flaking skin with some loss of hair at the scalpline  Eyes: EOM are normal.  Neck: Normal range of motion.  Cardiovascular: Normal rate and regular rhythm.  Pulmonary/Chest: Effort normal and breath sounds normal. No respiratory distress. She has no wheezes. She has no rales.  Abdominal: Soft. Bowel sounds are normal. She exhibits no distension. There is no tenderness. There is no rebound.  Musculoskeletal: She exhibits no edema.  Neurological: She is alert and oriented to person, place, and time. Coordination normal.  Skin: Skin is warm and dry.   Vitals:   08/12/17 0949  BP: 110/80  Pulse: 60  Temp: 97.9 F (36.6 C)  TempSrc: Oral  Weight: 163 lb (73.9 kg)  Height: 5' 6.5" (1.689 m)        Assessment & Plan:

## 2017-08-12 NOTE — Assessment & Plan Note (Signed)
Taking lisinopril 20 mg daily and BP at goal. Last BMP with oncology without indication for change. Encouraged her to continue taking daily.

## 2017-08-12 NOTE — Patient Instructions (Addendum)
We have sent in the shampoo to use when washing your hair.   You can also use oil on the scalp to help moisturize the scalp.

## 2017-08-12 NOTE — Assessment & Plan Note (Signed)
Rx for selenium shampoo to use and can use oil daily for moisture.

## 2017-08-24 ENCOUNTER — Telehealth: Payer: Self-pay | Admitting: Emergency Medicine

## 2017-08-24 NOTE — Telephone Encounter (Signed)
CT scan appt made. Instructions given to pt. Pt verbalized understanding.

## 2017-08-25 IMAGING — CT CT ABD-PELV W/ CM
2 of 5 series · 11 of 36 positions shown, 13 images · IV contrast (APPLIED)
Comparison: CT the chest 04/09/2016. CT the abdomen and pelvis
04/08/2016.

CLINICAL DATA: 82-year-old female with history of metastatic GIST
undergoing ongoing oral chemotherapy. Decreased appetite.

EXAM:
CT CHEST, ABDOMEN, AND PELVIS WITH CONTRAST
TECHNIQUE: Multidetector CT imaging of the chest, abdomen and pelvis was
performed following the standard protocol during bolus
administration of intravenous contrast.
CONTRAST:  100mL YCPAZG-C66 IOPAMIDOL (YCPAZG-C66) INJECTION 61%

[Series 2: cap with · axial · 0.82mm/px · z∈[-748,-248]mm · 8 of 124 slices shown, 10 images]
[im 12/124  mediastinal]
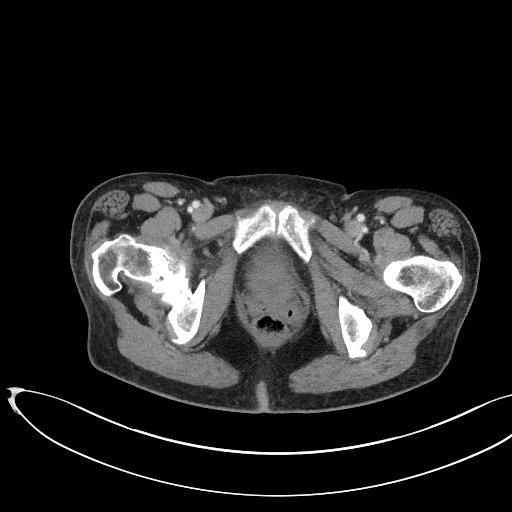
[im 12/124  lung]
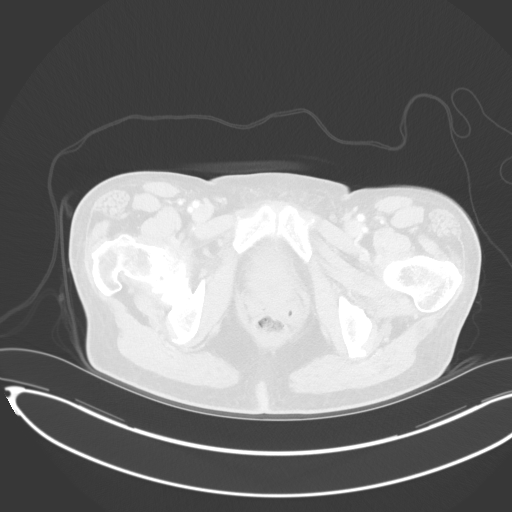
[im 23/124  lung]
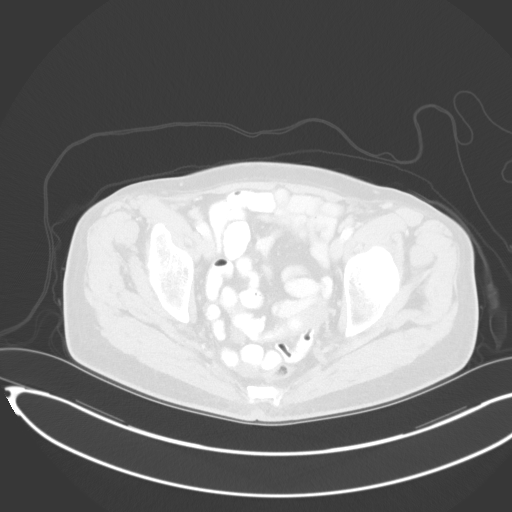
[im 45/124  lung]
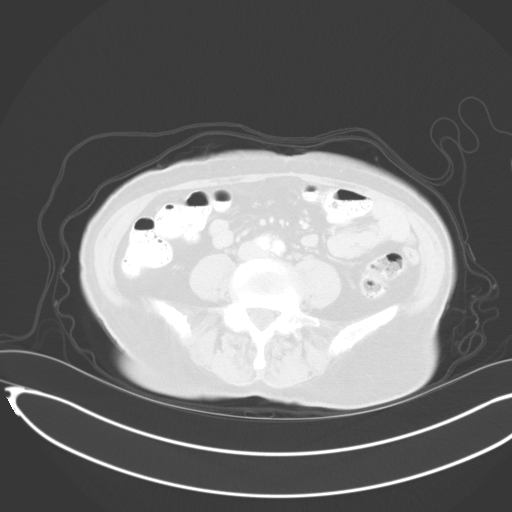
[im 56/124  lung]
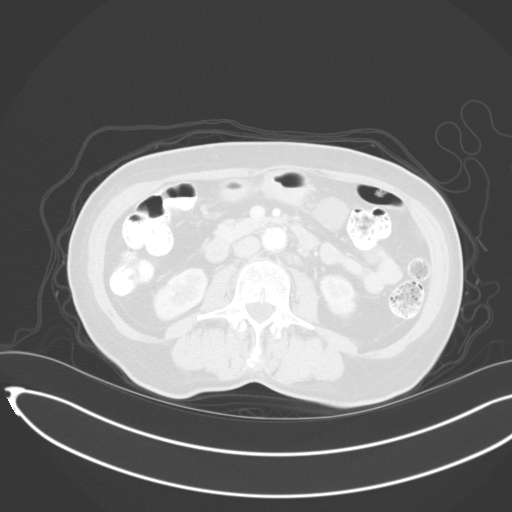
[im 68/124  mediastinal]
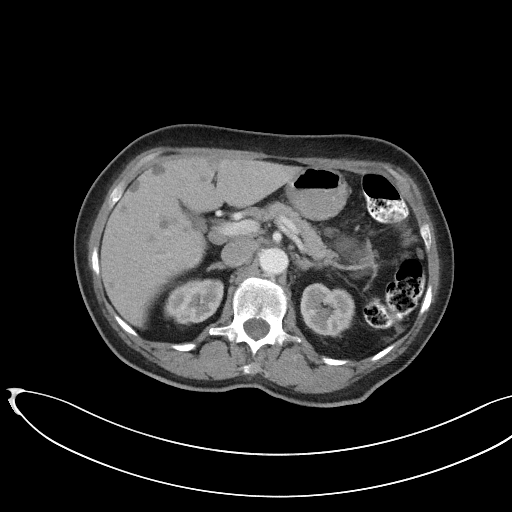
[im 68/124  lung]
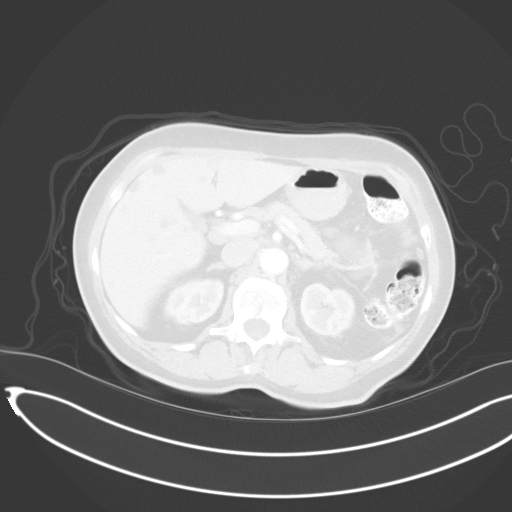
[im 79/124  lung]
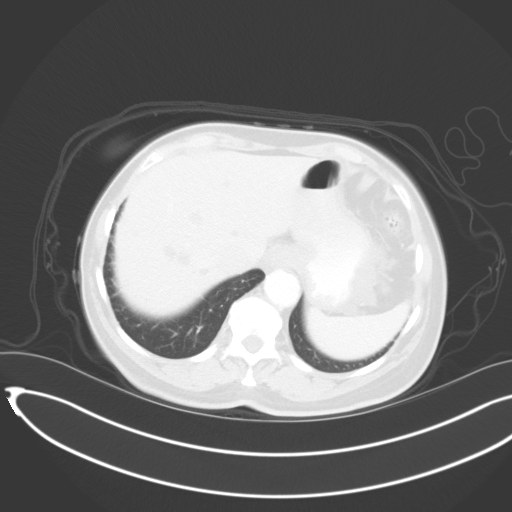
[im 101/124  lung]
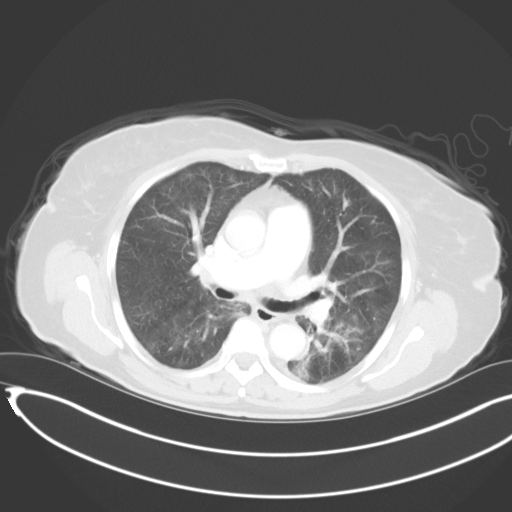
[im 112/124  lung]
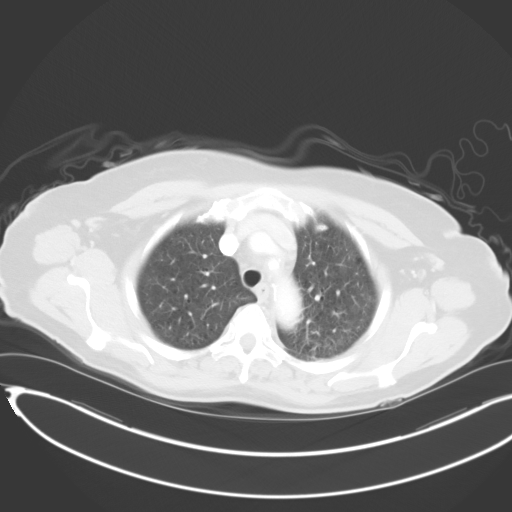

[Series 5: coronals · coronal · 0.82mm/px · 3 of 136 slices shown]
[im 28/136  lung]
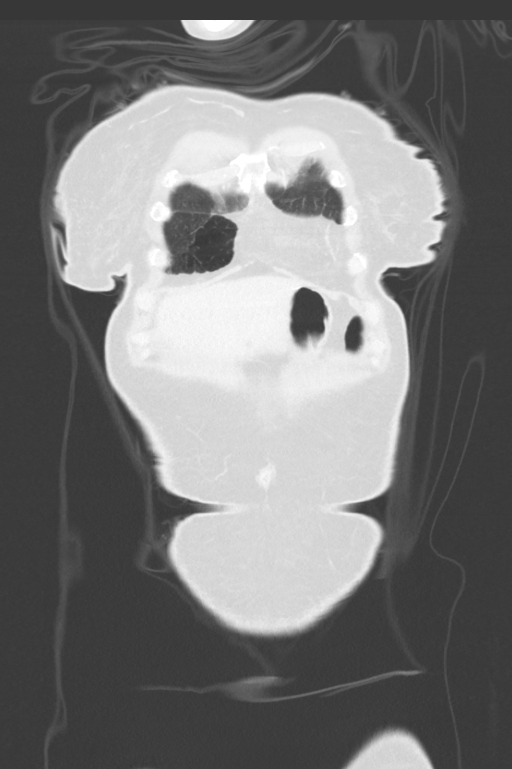
[im 55/136  lung]
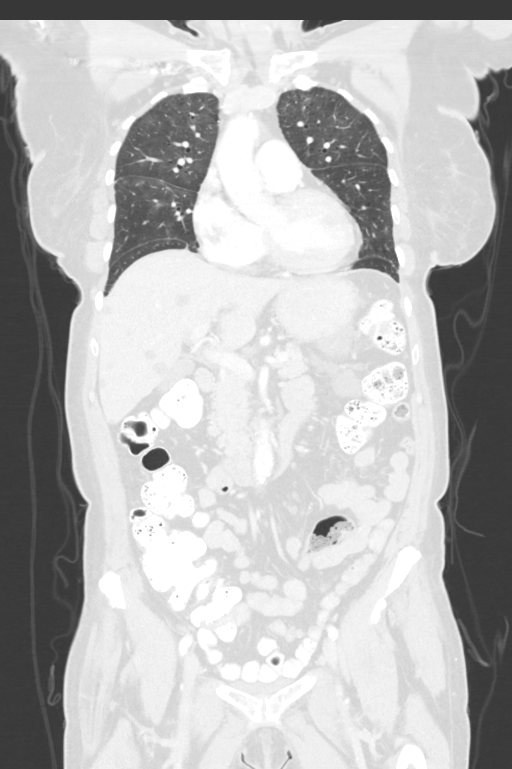
[im 82/136  lung]
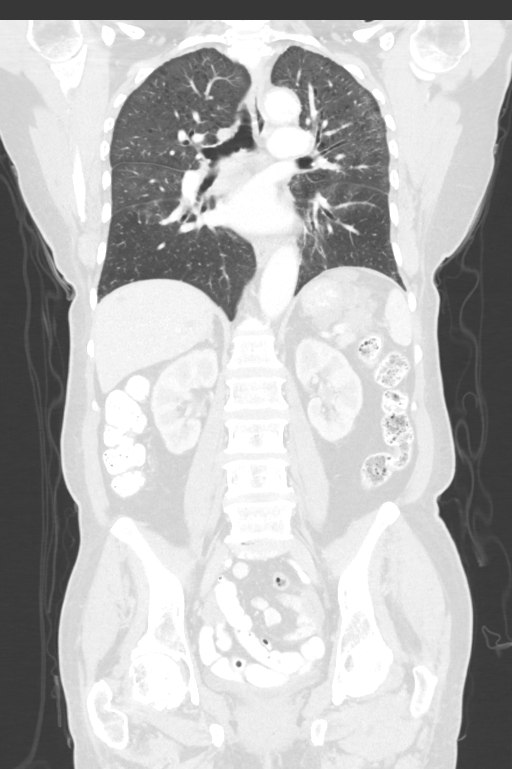

[11 of 36 positions shown; findings below may reference images not displayed]

FINDINGS: CT CHEST FINDINGS

Cardiovascular: Heart size is normal. There is no significant
pericardial fluid, thickening or pericardial calcification. There is
aortic atherosclerosis, as well as atherosclerosis of the great
vessels of the mediastinum and the coronary arteries, including
calcified atherosclerotic plaque in the right coronary artery.
Calcifications of the aortic valve.

Mediastinum/Nodes: No pathologically enlarged mediastinal or hilar
lymph nodes. The esophagus is unremarkable in appearance. No
axillary lymphadenopathy.

Lungs/Pleura: There are few scattered small pulmonary nodules which
are new compared to the prior examination, the largest of which
measures 9 x 5 mm (mean diameter of 7 mm) in the anterior aspect of
the right lower lobe (image 90 of series 4). Importantly, there are
widespread areas of ground-glass attenuation and some mild septal
thickening now noted throughout the lungs bilaterally, concerning
for potential drug reaction or developing interstitial lung disease.
No confluent consolidative airspace disease. No pleural effusions.
Mild diffuse bronchial wall thickening with mild centrilobular and
paraseptal emphysema.

Musculoskeletal: There are no aggressive appearing lytic or blastic
lesions noted in the visualized portions of the skeleton.

CT ABDOMEN PELVIS FINDINGS

Hepatobiliary: Compared to the prior study, the numerous ill-defined
hypovascular lesion seen on the prior examination have become far
more well-defined and more low-attenuation. This is favored to
reflect some interval tumoral necrosis of amongst the multiple
metastatic lesions. The largest of these is in the left lobe of the
liver between segments 2 and 3 (Image 52 of series 2) measuring
cm in diameter (previously 2 cm). No intra or extrahepatic biliary
ductal dilatation. Gallbladder is normal in appearance.

Pancreas: No pancreatic mass. No pancreatic ductal dilatation. No
pancreatic or peripancreatic fluid or inflammatory changes.

Spleen: Unremarkable.

Adrenals/Urinary Tract: Bilateral kidneys and bilateral adrenal
glands are unremarkable in appearance. No hydroureteronephrosis.
Urinary bladder nearly completely decompressed, but otherwise
unremarkable in appearance.

Stomach/Bowel: The previously noted exophytic centrally necrotic
gastric mass has significantly decreased in size, and is irregular
in shape and therefore difficult to measure, but estimated to
measure only 7.0 x 6.1 x 6.4 cm on today's examination (image 51 of
series 2 and coronal image 67 of series 5). There continues to be a
central area of low attenuation with some rim enhancement, which
likely reflects some internal central necrosis. Multiple adjacent
soft tissue nodules are again noted, compatible with peritoneal
spread of disease (discussed below). Distal aspects of the stomach
are unremarkable in appearance. There is no pathologic dilatation of
small bowel or colon. Normal appendix.

Vascular/Lymphatic: Aortic atherosclerosis, without evidence of
aneurysm or dissection in the abdominal or pelvic vasculature.
cm hepatic duodenal ligament lymph node is mildly enlarged. Several
prominent borderline enlarged retroperitoneal lymph nodes are also
noted.

Reproductive: Uterus and ovaries are atrophic.

Other: Multiple soft tissue nodules and masses are again noted
scattered throughout the peritoneal cavity, compatible with
widespread intraperitoneal metastatic disease. Because of a lesser
degree of mass effect from the large gastric mass on today's study
when compared to the prior study, the peritoneal nodules which were
previously more in the midline and left side of the abdomen have
shifted significantly into the left side of the abdomen and left
upper quadrant. The largest of these on the prior study measured up
to 6.1 x 4.3 cm and is currently only 2.2 x 2.7 cm (axial image 72
of series 2, now to the left of midline). Another large lesion in
the left upper quadrant on today's examination measures 2.5 x 3.0 cm
(axial image 62 of series 2), previously 3.9 x 4.3 cm). Several
other smaller lesions are also noted, also decreased in size
compared to the prior examination. No new metastatic deposits are
confidently identified elsewhere in the peritoneal cavity.

Musculoskeletal: There are no aggressive appearing lytic or blastic
lesions noted in the visualized portions of the skeleton.
IMPRESSION: 1. Today's study demonstrates a positive response to therapy with
regression of the primary gastric neoplasm, as well as regression of
numerous intraperitoneal metastatic lesions. Additionally, the liver
lesions seen on the prior study appear far more well-defined,
decreased in size and are much lower attenuation, suggesting
internal areas of necrosis.
2. Several small pulmonary nodules are noted, some of which are new
compared to the prior examination. These are nonspecific, but
metastatic disease to the lungs is not excluded. Attention on
follow-up imaging is recommended.
3. Importantly, there is also widespread ground-glass attenuation
and septal thickening in the lungs bilaterally which is new compared
to the prior study. In the appropriate clinical setting, this could
reflect a drug reaction. Other differential considerations include
atypical infection, or even developing interstitial lung disease.
Clinical correlation is suggested.
4. Aortic atherosclerosis, in addition to right coronary artery
disease.
5. Mild diffuse bronchial wall thickening with mild centrilobular
and paraseptal emphysema; imaging findings suggestive of underlying
COPD.
6. Additional incidental findings, as above.

## 2017-09-04 ENCOUNTER — Ambulatory Visit (HOSPITAL_COMMUNITY): Payer: Medicare Other

## 2017-09-07 ENCOUNTER — Ambulatory Visit (HOSPITAL_COMMUNITY)
Admission: RE | Admit: 2017-09-07 | Discharge: 2017-09-07 | Disposition: A | Discharge status: Home / Self Care | Payer: Medicare Other | Source: Ambulatory Visit | Attending: Nurse Practitioner | Admitting: Nurse Practitioner

## 2017-09-07 ENCOUNTER — Inpatient Hospital Stay: Payer: Medicare Other | Attending: Nurse Practitioner

## 2017-09-07 ENCOUNTER — Encounter (HOSPITAL_COMMUNITY): Payer: Self-pay

## 2017-09-07 DIAGNOSIS — E785 Hyperlipidemia, unspecified: Secondary | ICD-10-CM | POA: Diagnosis not present

## 2017-09-07 DIAGNOSIS — C787 Secondary malignant neoplasm of liver and intrahepatic bile duct: Secondary | ICD-10-CM | POA: Diagnosis not present

## 2017-09-07 DIAGNOSIS — C786 Secondary malignant neoplasm of retroperitoneum and peritoneum: Secondary | ICD-10-CM | POA: Diagnosis not present

## 2017-09-07 DIAGNOSIS — C49A2 Gastrointestinal stromal tumor of stomach: Secondary | ICD-10-CM

## 2017-09-07 DIAGNOSIS — D509 Iron deficiency anemia, unspecified: Secondary | ICD-10-CM | POA: Diagnosis not present

## 2017-09-07 DIAGNOSIS — M1990 Unspecified osteoarthritis, unspecified site: Secondary | ICD-10-CM | POA: Diagnosis not present

## 2017-09-07 DIAGNOSIS — I10 Essential (primary) hypertension: Secondary | ICD-10-CM | POA: Insufficient documentation

## 2017-09-07 DIAGNOSIS — K769 Liver disease, unspecified: Secondary | ICD-10-CM | POA: Insufficient documentation

## 2017-09-07 DIAGNOSIS — Z86010 Personal history of colonic polyps: Secondary | ICD-10-CM | POA: Insufficient documentation

## 2017-09-07 DIAGNOSIS — I700 Atherosclerosis of aorta: Secondary | ICD-10-CM | POA: Insufficient documentation

## 2017-09-07 DIAGNOSIS — Z79899 Other long term (current) drug therapy: Secondary | ICD-10-CM | POA: Insufficient documentation

## 2017-09-07 DIAGNOSIS — J449 Chronic obstructive pulmonary disease, unspecified: Secondary | ICD-10-CM | POA: Insufficient documentation

## 2017-09-07 LAB — COMPREHENSIVE METABOLIC PANEL
ALBUMIN: 4 g/dL (ref 3.5–5.0)
ALT: 11 U/L (ref 0–55)
AST: 23 U/L (ref 5–34)
Alkaline Phosphatase: 71 U/L (ref 40–150)
Anion gap: 9 (ref 3–11)
BILIRUBIN TOTAL: 0.5 mg/dL (ref 0.2–1.2)
BUN: 10 mg/dL (ref 7–26)
CHLORIDE: 111 mmol/L — AB (ref 98–109)
CO2: 24 mmol/L (ref 22–29)
Calcium: 9.4 mg/dL (ref 8.4–10.4)
Creatinine, Ser: 1.22 mg/dL — ABNORMAL HIGH (ref 0.60–1.10)
GFR calc Af Amer: 47 mL/min — ABNORMAL LOW (ref >60)
GFR calc non Af Amer: 40 mL/min — ABNORMAL LOW (ref >60)
GLUCOSE: 83 mg/dL (ref 70–140)
POTASSIUM: 3.6 mmol/L (ref 3.5–5.1)
Sodium: 144 mmol/L (ref 136–145)
Total Protein: 7.4 g/dL (ref 6.4–8.3)

## 2017-09-07 LAB — CBC WITH DIFFERENTIAL/PLATELET
BASOS ABS: 0 10*3/uL (ref 0.0–0.1)
BASOS PCT: 1 %
Eosinophils Absolute: 0.2 10*3/uL (ref 0.0–0.5)
Eosinophils Relative: 5 %
HEMATOCRIT: 36.8 % (ref 34.8–46.6)
Hemoglobin: 12.5 g/dL (ref 11.6–15.9)
Lymphocytes Relative: 34 %
Lymphs Abs: 1.5 10*3/uL (ref 0.9–3.3)
MCH: 36.2 pg — ABNORMAL HIGH (ref 25.1–34.0)
MCHC: 34 g/dL (ref 31.5–36.0)
MCV: 106.3 fL — ABNORMAL HIGH (ref 79.5–101.0)
Monocytes Absolute: 0.4 10*3/uL (ref 0.1–0.9)
Monocytes Relative: 10 %
NEUTROS ABS: 2.2 10*3/uL (ref 1.5–6.5)
NEUTROS PCT: 50 %
Platelets: 179 10*3/uL (ref 145–400)
RBC: 3.46 MIL/uL — AB (ref 3.70–5.45)
RDW: 13.4 % (ref 11.2–14.5)
WBC: 4.3 10*3/uL (ref 3.9–10.3)

## 2017-09-07 MED ORDER — IOPAMIDOL (ISOVUE-300) INJECTION 61%
INTRAVENOUS | Status: AC
  Administered 2017-09-07: 100 mL via INTRAVENOUS
  Filled 2017-09-07: qty 100

## 2017-09-07 MED ORDER — IOPAMIDOL (ISOVUE-300) INJECTION 61%
100.0000 mL | Freq: Once | INTRAVENOUS | Status: AC | PRN
  Administered 2017-09-07: 100 mL via INTRAVENOUS

## 2017-09-07 MED FILL — IMATINIB MESYLATE 400 MG TA: 400 | 30 days supply | Qty: 30 | Fill #3

## 2017-09-08 NOTE — Progress Notes (Signed)
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      04/09/2016 Initial Biopsy    Gastric mass biopsy showed gastrointestinal stromal tumor, I feel mitosis and necrosis, intermediate to high-grade.      04/09/2016 Imaging    CT chest, abdomen and pelvis with contrast showed a large exophytic mass in the proximal lateral stomach measuring 10 x 7.4 cm, nodular lesions within omentum and multiple liver nodules are highly suspicious for metastatic disease.      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 Today's study demonstrates a positive response to therapy with regression of the primary gastric neoplasm, as well as regression of numerous intraperitoneal  metastatic lesions. Additionally, the liver lesions seen on the prior study appear far more well-defined, decreased in size and are much lower attenuation, suggesting internal areas of necrosis. 2. Several small pulmonary nodules are noted, some of which are new compared to the prior examination. These are nonspecific, but metastatic disease to the lungs is not excluded. Attention on follow-up imaging is recommended. 3. Importantly, there is also widespread ground-glass attenuation and septal thickening in the lungs bilaterally which is new compared to the prior study. In the appropriate clinical setting, this could reflect a drug reaction. Other differential considerations include atypical infection, or even developing interstitial lung disease. Clinical correlation is suggested. 4. Aortic atherosclerosis, in addition to right coronary artery disease. 5. Mild diffuse bronchial wall thickening with mild centrilobular and paraseptal emphysema; imaging findings suggestive of underlying COPD. 6. Additional incidental findings, as above.      09/22/2016 Imaging     CT CAP W CONTRAST   IMPRESSION: 1. Nodular appearing GI stromal tumor along the posterior gastric wall with associated peritoneal implants and hepatic metastases, stable from 07/16/2016. 2. Improving mid and lower lung zone predominant peribronchovascular ground-glass, possibly related to drug therapy. 3. A few scattered pulmonary nodules are stable. 4. Aortic atherosclerosis (ICD10-170.0). Coronary artery calcification.      01/14/2017 Imaging    CT CAP w Contrast  IMPRESSION: 1. Further decrease in size of large GI stromal tumor arising from the greater curvature of the stomach with improvement in hepatic and multifocal peritoneal metastases. 2. Further improvement in lower  lung predominant peribronchovascular ground-glass density, probably inflammatory (drug reaction or or infectious). 3. No acute findings. 4.   Aortic Atherosclerosis (ICD10-I70.0).      05/08/2017 Imaging    IMPRESSION: 1. Overall, today's examination is very similar to recent prior studies again demonstrating a large proximal gastric mass with adjacent gastrosplenic ligament lymphadenopathy, multiple treated lesions in the liver, and other intraperitoneal metastases, as discussed above. No definite new sites of metastatic disease are otherwise noted. 2. Aortic atherosclerosis, in addition to right coronary artery disease. 3. Mild cardiomegaly. 4. There are calcifications of the aortic valve. Echocardiographic correlation for evaluation of potential valvular dysfunction may be warranted if clinically indicated. 5. Additional incidental findings, as above. Aortic Atherosclerosis (ICD10-I70.0).      09/07/2017 Imaging    CT CAP W Contrast 09/07/17 IMPRESSION: 1. Large mass extending off the greater curvature of the stomach is similar to prior. Majority of nodularity within the adjacent mesentery is similar. There is one adjacent nodule which has increased in size when compared to prior exam. 2. Grossly unchanged treated hepatic lesions.      History of present illness (04/10/2016):   Haley Mcmillan a 84 y.o.femalewith medical history significantfor anemia, arthritis, colonic polyps, hyperlipidemia, hypertension presents to the emergency department after recent GI visit with worsening abdominal pain. She states the pain is intermittent, started about 2 weeks ago, located in the epigastric area, no significant nausea or change of her bowel habits. Her appetite and energy level has decreased lately, although she is able to take care of herself. When the pain resolves, she remains to be active and do house cleaning etc. he has lost about 30 pounds in the past 10-12 months, and the most weight with loss in the past few months. A CT scan showed a large exophytic mass in proximal lateral stomach, measuring 10 x 7.4 cm, and  nodular lesions in the omentum, and a multiple liver lesions suspicious for metastatic disease. She underwent EGD by Dr. Simona Huh yesterday, which showed a large gastric tumor in the fundus, biopsy was done, pathology results still pending.  CURRENT THERAPY: Gleevec '400mg'$  daily, started on 04/22/2016  INTERVAL HISTORY:  Haley Mcmillan returns for follow-up. She presents to the clinic today by herself. She reports she is doing okay overall. She states she has some skin dryness on her forehead being managed by her PCP. She states she has one loose bowel movement daily that she thinks is related to the [place]. She is compliant with Gleevec. She does admit that she is moderately anxious today and her BP is elevated due to hearing her scan results. The scan shows stable disease and she is reassured.   On review of systems, pt denies diarhea, abdominal pain, headache, CP or any other complaints at this time. Pertinent positives are listed and detailed within the above HPI.   REVIEW OF SYSTEMS:   Constitutional: Denies fevers, chills  (+) appetite good  Eyes: Denies blurriness of vision Ears, nose, mouth, throat, and face: Denies mucositis or sore throat Respiratory: Denies dyspnea or wheezes  Cardiovascular: Denies palpitation, chest discomfort or lower extremity swelling Gastrointestinal:  Denies nausea, heartburn (+) loose bowel movement  Skin: Denies abnormal skin rashes (+) dry skin on forehead Lymphatics: Denies new lymphadenopathy or easy bruising Neurological:Denies numbness, tingling or new weaknesses Behavioral/Psych: Mood is stable, no new changes  All other systems were reviewed with the patient and are negative.  MEDICAL HISTORY:  Past Medical History:  Diagnosis Date  . Anemia   .  Arthritis   . Colon polyps   . Olecranon bursitis    Elbow  . Other and unspecified hyperlipidemia   . Palpitations   . Unspecified essential hypertension     SURGICAL HISTORY: Past Surgical  History:  Procedure Laterality Date  . COLONOSCOPY W/ POLYPECTOMY  01/2004  . CRYOTHERAPY     female-cryosurgery stated by pt  . ESOPHAGOGASTRODUODENOSCOPY N/A 04/09/2016   Procedure: ESOPHAGOGASTRODUODENOSCOPY (EGD);  Surgeon: Doran Stabler, MD;  Location: [facility] ENDOSCOPY;  Service: Endoscopy;  Laterality: N/A;  . POLYPECTOMY     from vocal surgery    I have reviewed the social history and family history with the patient and they are unchanged from previous note.  She stopped smoking in 1996 and began smoking sometime when she was a teenager.  ALLERGIES:  has No Known Allergies.  MEDICATIONS:  Current Outpatient Medications  Medication Sig Dispense Refill  . Cholecalciferol (VITAMIN D3) 5000 units CAPS Take 5,000 Units by mouth daily.     . Cyanocobalamin (VITAMIN B 12 PO) Take 1 tablet by mouth daily.    Marland Kitchen imatinib (GLEEVEC) 400 MG tablet TAKE 1 TABLET BY MOUTH ONCE DAILY. TAKE WITH MEALS AND LARGE GLASS OF WATER. CAUTION:CHEMOTHERAPY 30 tablet 3  . lisinopril (PRINIVIL,ZESTRIL) 20 MG tablet Take 1 tablet (20 mg total) by mouth daily. 90 tablet 3  . Selenium Sulfide 2.25 % SHAM Apply 10 mLs topically 2 (two) times a week. 180 mL 6   No current facility-administered medications for this visit.     PHYSICAL EXAMINATION:  ECOG PERFORMANCE STATUS: 0  Vitals:   09/09/17 1058 09/09/17 1100  BP: (!) 182/70 (!) 170/64  Pulse: 61 (!) 59  Resp:    Temp:    SpO2:     Filed Weights   09/09/17 1048  Weight: 161 lb 9.6 oz (73.3 kg)     GENERAL:alert. SKIN: skin color, texture, turgor are normal, no rashes or significant lesions EYES: normal, Conjunctiva are pink and non-injected, sclera clear OROPHARYNX:no exudate, no erythema and lips, buccal mucosa, and tongue normal  NECK: supple, thyroid normal size, non-tender, without nodularity LYMPH:  no palpable lymphadenopathy in the cervical, axillary or inguinal LUNGS: clear to auscultation and percussion with normal breathing  effort HEART: regular rate & rhythm and no murmurs and no lower extremity edema ABDOMEN:abdomen soft, non-tender and normal bowel sounds Musculoskeletal:no cyanosis of digits and no clubbing  NEURO: alert & oriented x 3 with fluent speech, no focal motor/sensory deficits  LABORATORY DATA:  I have reviewed the data as listed CBC Latest Ref Rng & Units 09/07/2017 07/10/2017 05/08/2017  WBC 3.9 - 10.3 K/uL 4.3 4.7 4.5  Hemoglobin 11.6 - 15.9 g/dL 12.5 12.2 12.8  Hematocrit 34.8 - 46.6 % 36.8 37.0 38.0  Platelets 145 - 400 K/uL 179 147 167     CMP Latest Ref Rng & Units 09/07/2017 07/10/2017 05/08/2017  Glucose 70 - 140 mg/dL 83 88 85  BUN 7 - 26 mg/dL 10 10 10.9  Creatinine 0.60 - 1.10 mg/dL 1.22(H) 1.25(H) 1.2(H)  Sodium 136 - 145 mmol/L 144 144 142  Potassium 3.5 - 5.1 mmol/L 3.6 4.1 4.0  Chloride 98 - 109 mmol/L 111(H) 109 -  CO2 22 - 29 mmol/L '24 27 26  '$ Calcium 8.4 - 10.4 mg/dL 9.4 8.9 9.3  Total Protein 6.4 - 8.3 g/dL 7.4 7.1 7.5  Total Bilirubin 0.2 - 1.2 mg/dL 0.5 0.3 0.54  Alkaline Phos 40 - 150 U/L 71 68 62  AST  5 - 34 U/L '23 20 20  '$ ALT 0 - 55 U/L '11 8 9   '$ PATHOLOGY REPORT  Diagnosis 04/09/2016 Stomach, biopsy - GASTROINTESTINAL STROMAL TUMOR (GIST) - SEE COMMENT. Microscopic Comment The biopsy fragments reveal a hypercellular spindle cell proliferation with cytologic atypia, a few mitoses and necrosis. 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 Immunohistochemical stains were performed revealing that the tumor cells are strongly positive for CD117 and CD34. They are negative for cytokeratin A18, cytokeratin AE1/AE3, desmin, smooth muscle actin, smooth muscle myosin and S100. Dr. Claudette Laws has reviewed the case and concurs with this interpretation. Dr. Annamaria Boots was paged on 04/11/16.   Diagnosis 04/14/2016 Peritoneum, biopsy, Anterior abdomen - GASTROINTESTINAL STROMAL TUMOR (GIST). - SEE COMMENT. Microscopic  Comment The tumor is morphologically identical to the patient's previous biopsy ([id number]). Additional studies can be performed upon clinical request. (JBK:gt, 04/15/16)    RADIOGRAPHIC STUDIES: I have personally reviewed the radiological images as listed and agreed with the findings in the report. No results found.   EGD 04/09/2016 Dr. Loletha Carrow  The esophagus was normal. Findings: A large, fungating and infiltrative, very deeply-ulcerated and necrotic, malignant-appearing mass with stigmata of recent bleeding was found in the gastric fundus. Biopsies were taken with a cold forceps for histology. The duodenal bulb was normal. - Normal esophagus. - Likely malignant gastric tumor in the gastric fundus. extensively biopsied. - Normal duodenal bulb.   CT CAP W CONTRAST  09/22/16 IMPRESSION: 1. Nodular appearing GI stromal tumor along the posterior gastric wall with associated peritoneal implants and hepatic metastases, stable from 07/16/2016. 2. Improving mid and lower lung zone predominant peribronchovascular ground-glass, possibly related to drug therapy. 3. A few scattered pulmonary nodules are stable. 4. Aortic atherosclerosis (ICD10-170.0). Coronary artery Calcification.  CT CAP W Contrast 09/07/17 IMPRESSION: 1. Large mass extending off the greater curvature of the stomach is similar to prior. Majority of nodularity within the adjacent mesentery is similar. There is one adjacent nodule which has increased in size when compared to prior exam. 2. Grossly unchanged treated hepatic lesions.  ASSESSMENT & PLAN:  84 y.o. African-American female, with past medical history of hypertension, otherwise healthy and active, presented with progressive weight loss, fatigue, anorexia and intermittent epigastric pain.  1. Malignant gastrointestinal stromal tumor (GIST) of stomach, with liver and peritoneal metastasis -I previously reviewed her CT scan findings, gastric mass and  peritoneal mass biopsy results, in details with patient and her family members -We previously reviewed the neck to history of GIST, and treatment options. -We previously discussed this is not curable disease, but is very treatable, with mediastinal survival 4-5 years -Her KIT mutation showed exon 11 deletion, which predicts better response to TKI,  I previously reviewed the results in detail with her -She has been tolerating Gleevec very well, no noticeable side effects, except mild anorexia which could be related and occasional cough with phlegm production.  she overall has felt better since she started the [place]. -We previously reviewed her CT scan findings from 07/16/2016, this showed a positive response to therapy with regression of the primary gastric neoplasm, as well as regression of numerous intraperitoneal metastatic lesions. But several small pulmonary nodules were noted, some of which are new compared to the prior examination. Also seen, is widespread mild ground-glass attenuation and septal thickening in the lungs bilaterally which is new compared to the prior study. I'm not sure what the etiology of her pulmonary changes, COPD vs pneumonitis from [facility]  is possible, she is clinically asymptomatic, I'll continue monitoring. -I previously reviewed her restaging CT scan from 09/22/2016, the image were reviewed in person. She had stable disease in the abdomen, her groundglass changes in bilateral lungs have much improved, no significant evidence of pulmonary metastasis. This is likely related to [place], which has improved, we'll continue monitoring. She is clinically asymptomatic.  - CT scan from 01/14/17 reviewed with pt previously, which showed continuous improvement in the gastric mass and peritoneal mets, no new lesions. She is tolerating Gleevec 400 mg very well, clinically doing very well, without any symptoms, will continue treatment. -CT CAP W Contrast from 09/07/17 showed a similar large  mass extending off the greater curvature of the stomach, similar nodularity within the adjacent mesentery and there is one adjacent nodule which has increased in size. Hepatic lesions are unchanged. I discussed the results with the pt. Her disease is overall stable and she will continue Gleevec 400 mg daily.  -I again discussed that the goal of care is palliative, and she would likely develop resistance to [place] down the road. If her future scan shows progressive disease we will change her Gleevec to another TKI such as Sutent. Plan to repeat next scan in 4 months  -Labs reviewed, CBC and CMP WNL, and the size of her RBC are slightly large but that may be due to Gleevac. She has mildly decreased kidney function with Cr 1.22 and decreased GFRs. Her labs are still adequate to continue treatment.  -I encouraged her to drink plenty of water daily.  -f/u in 2 months   2. Anemia, iron deficiency  -Her anemia has overall improved since she started iron pill. Her initial study will consistent with iron deficiency -Her anemia has resolved after oral iron supplement, so she was reduced to 1 ferrous sulfate tablet a day.  -She has been taking iron pill since hospital and her iron levels have improved overall  3. HTN, uncontrolled  -Her BP has been persistently high lately in my office, but she states her blood pressure has been mostly normal at home, she is asymptomatic.   -Continue monitoring her blood pressure at home and follow-up with her primary care physician  4. Pulmonary nodules and groundglass change -Found on CT scan in 06/2016. Uncertain etiology. Atypical infection, COPD, vs Gleevec induced pneumonitis are on the differential. She does not have a clinical presentation of pneumonia. Likely related to [place]  -She is clinically asymptomatic, exam was unremarkable.  -repeated CT scan in 09/2016 and 01/2017 showed significant improvement. She remains to be asymptomatic, we'll continue monitoring  5.  Dry skin, slight peeling -I previously suggested she uses Vaseline and lotion and wear socks to keep her skin from getting dry. From her medication use.    Plan -CT CAP reviewed with the pt, she has stable disease overall  -Continue 400 mg Gleevec daily -Plan to repeat scan in 4 months, order on nest visit -Lab and f/u in 2 months    All questions were answered. The patient knows to call the clinic with any problems, questions or concerns. No barriers to learning was detected.  I spent 20 minutes counseling the patient face to face. The total time spent in the appointment was 25 minutes and more than 50% was on counseling and review of test results  This document serves as a record of services personally performed by Truitt Merle, MD. It was created on her behalf by Theresia Bough, a trained medical scribe. The creation of  this record is based on the scribe's personal observations and the provider's statements to them.   I have reviewed the above documentation for accuracy and completeness, and I agree with the above.  Truitt Merle  09/09/17 3:46 PM

## 2017-09-09 ENCOUNTER — Telehealth: Payer: Self-pay

## 2017-09-09 ENCOUNTER — Encounter: Payer: Self-pay | Admitting: Hematology

## 2017-09-09 ENCOUNTER — Inpatient Hospital Stay (HOSPITAL_BASED_OUTPATIENT_CLINIC_OR_DEPARTMENT_OTHER): Payer: Medicare Other | Admitting: Hematology

## 2017-09-09 VITALS — BP 170/64 | HR 59 | Temp 97.6°F | Resp 18 | Ht 66.5 in | Wt 161.6 lb

## 2017-09-09 DIAGNOSIS — D509 Iron deficiency anemia, unspecified: Secondary | ICD-10-CM | POA: Diagnosis not present

## 2017-09-09 DIAGNOSIS — C786 Secondary malignant neoplasm of retroperitoneum and peritoneum: Secondary | ICD-10-CM | POA: Diagnosis not present

## 2017-09-09 DIAGNOSIS — C787 Secondary malignant neoplasm of liver and intrahepatic bile duct: Secondary | ICD-10-CM | POA: Diagnosis not present

## 2017-09-09 DIAGNOSIS — Z79899 Other long term (current) drug therapy: Secondary | ICD-10-CM | POA: Diagnosis not present

## 2017-09-09 DIAGNOSIS — D500 Iron deficiency anemia secondary to blood loss (chronic): Secondary | ICD-10-CM

## 2017-09-09 DIAGNOSIS — C49A2 Gastrointestinal stromal tumor of stomach: Secondary | ICD-10-CM

## 2017-09-09 DIAGNOSIS — I10 Essential (primary) hypertension: Secondary | ICD-10-CM

## 2017-09-09 NOTE — Telephone Encounter (Signed)
Printed avs and calender of upcoming appointment. Per 3/27 los 

## 2017-09-15 ENCOUNTER — Ambulatory Visit (INDEPENDENT_AMBULATORY_CARE_PROVIDER_SITE_OTHER): Payer: Medicare Other | Admitting: Internal Medicine

## 2017-09-15 ENCOUNTER — Encounter: Payer: Self-pay | Admitting: Internal Medicine

## 2017-09-15 VITALS — BP 140/80 | HR 62 | Temp 98.0°F | Ht 66.5 in | Wt 162.0 lb

## 2017-09-15 DIAGNOSIS — L659 Nonscarring hair loss, unspecified: Secondary | ICD-10-CM | POA: Diagnosis not present

## 2017-09-15 MED ORDER — TRIAMCINOLONE ACETONIDE 0.1 % EX CREA: 1.0000 "application " | TOPICAL_CREAM | Freq: Two times a day (BID) | CUTANEOUS | 0 refills | Status: DC

## 2017-09-15 NOTE — Progress Notes (Signed)
   Subjective:    Patient ID: Haley Mcmillan, female    DOB: [birth year], 84 y.o.   MRN: [id number]  HPI The patient is an 84 YO female coming in for problems with dry scalp. She is worried about it. Going on for some time now. She feels it is gradually worsening. Seen about 1-2 months ago and given shampoo to see if this would help. It is still itching a lot and burning. She is having a lot of dry skin to come off at home as well. The rash is spreading down onto her side of her face and ears. She is still taking gleevec.   Review of Systems  Constitutional: Negative.   Respiratory: Negative for cough, chest tightness and shortness of breath.   Cardiovascular: Negative for chest pain, palpitations and leg swelling.  Gastrointestinal: Negative for abdominal distention, abdominal pain, constipation, diarrhea, nausea and vomiting.  Musculoskeletal: Negative.   Skin: Positive for rash.       Hair loss  Neurological: Negative.   Psychiatric/Behavioral: Negative.       Objective:   Physical Exam  Constitutional: She is oriented to person, place, and time. She appears well-developed and well-nourished.  HENT:  Head: Normocephalic and atraumatic.  Scalp with dryness and some patchy alopecia, the darkening skin and rash extends onto the hairline and down the side of the faces and onto the earlobes bilaterally with dry skin and itching.   Eyes: EOM are normal.  Neck: Normal range of motion.  Cardiovascular: Normal rate and regular rhythm.  Pulmonary/Chest: Effort normal and breath sounds normal. No respiratory distress. She has no wheezes. She has no rales.  Abdominal: Soft.  Neurological: She is alert and oriented to person, place, and time. Coordination normal.  Skin: Skin is warm and dry. Rash noted.   Vitals:   09/15/17 1048  BP: 140/80  Pulse: 62  Temp: 98 F (36.7 C)  TempSrc: Oral  Weight: 162 lb (73.5 kg)  Height: 5' 6.5" (1.689 m)      Assessment & Plan:

## 2017-09-15 NOTE — Patient Instructions (Addendum)
We will get you in with the dermatology. We have sent in a cream for the face and neck you can use twice a day.

## 2017-09-15 NOTE — Assessment & Plan Note (Signed)
Suspect from the gleevec and have placed referral to dermatology to see if there is another intervention that might help. Rx for triamcinolone for ears, hairline for the itching. Advised not to scratch and continue moisturizing.

## 2017-09-28 ENCOUNTER — Other Ambulatory Visit: Payer: Self-pay | Admitting: Pharmacist

## 2017-10-07 ENCOUNTER — Other Ambulatory Visit: Payer: Self-pay | Admitting: Hematology

## 2017-10-07 DIAGNOSIS — C49A2 Gastrointestinal stromal tumor of stomach: Secondary | ICD-10-CM

## 2017-10-09 MED FILL — IMATINIB MESYLATE 400 MG TA: 400 | 30 days supply | Qty: 30 | Fill #0

## 2017-11-01 IMAGING — CT CT ABD-PELV W/ CM
2 of 5 series · 13 of 36 positions shown, 16 images · IV contrast (APPLIED)
Comparison: 07/16/2016.

CLINICAL DATA: GI stromal tumor, ongoing oral chemotherapy.

EXAM:
CT CHEST, ABDOMEN, AND PELVIS WITH CONTRAST
TECHNIQUE: Multidetector CT imaging of the chest, abdomen and pelvis was
performed following the standard protocol during bolus
administration of intravenous contrast.
CONTRAST:  100mL [id number] IOPAMIDOL ([id number]) INJECTION 61%

[Series 2: cap with · axial · 0.78mm/px · z∈[+1104,+1614]mm · 10 of 126 slices shown, 13 images]
[im 12/126  mediastinal]
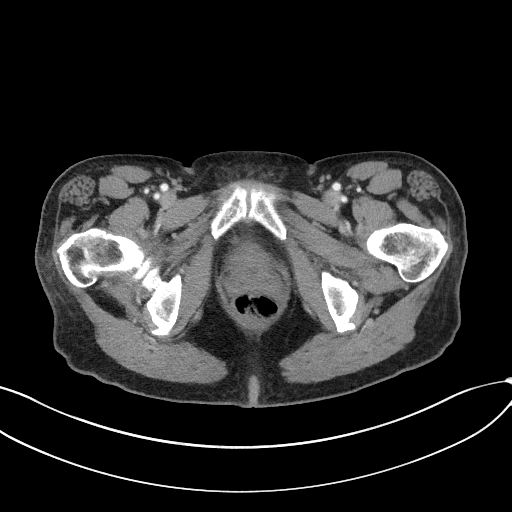
[im 12/126  lung]
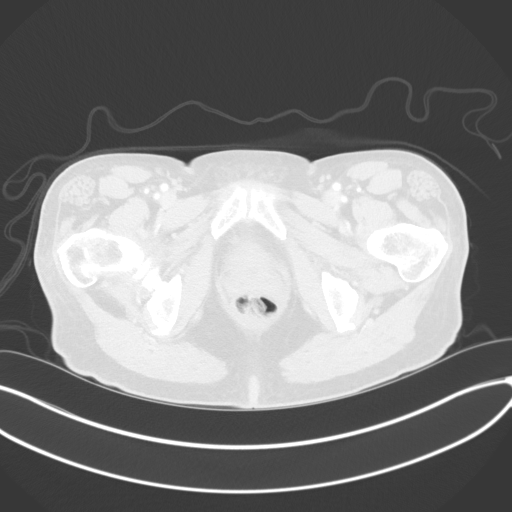
[im 23/126  lung]
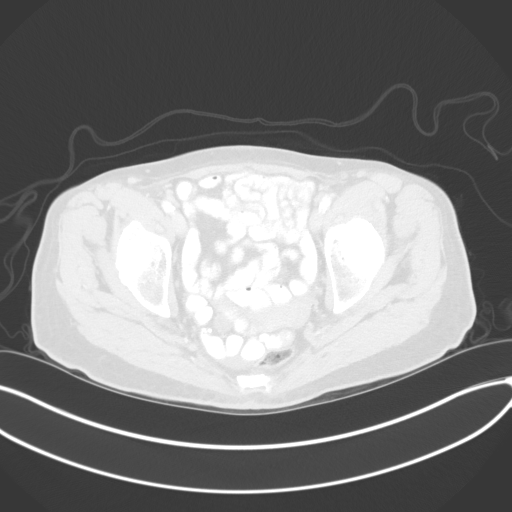
[im 35/126  lung]
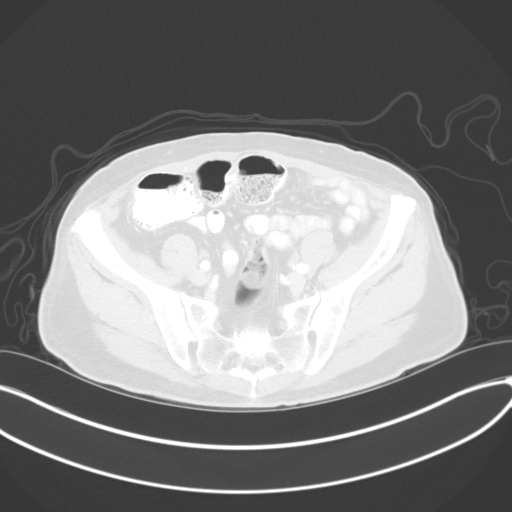
[im 46/126  lung]
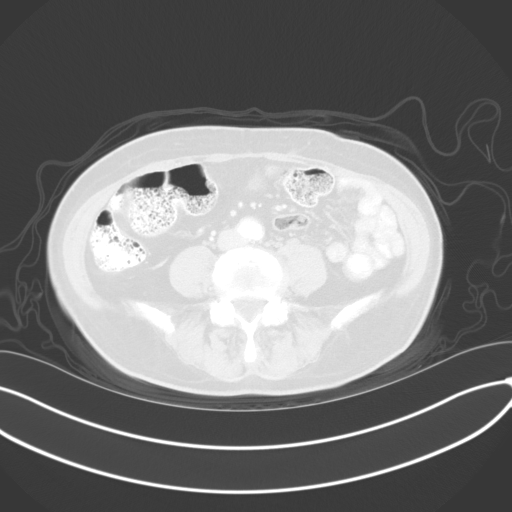
[im 57/126  mediastinal]
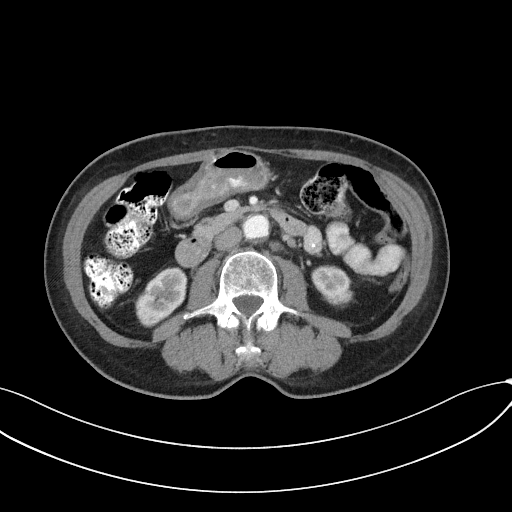
[im 57/126  lung]
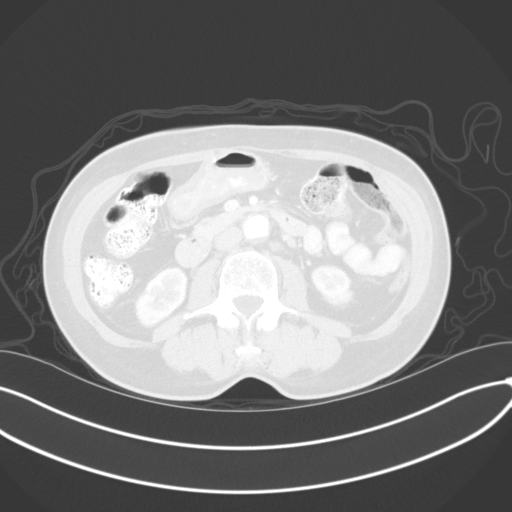
[im 69/126  lung]
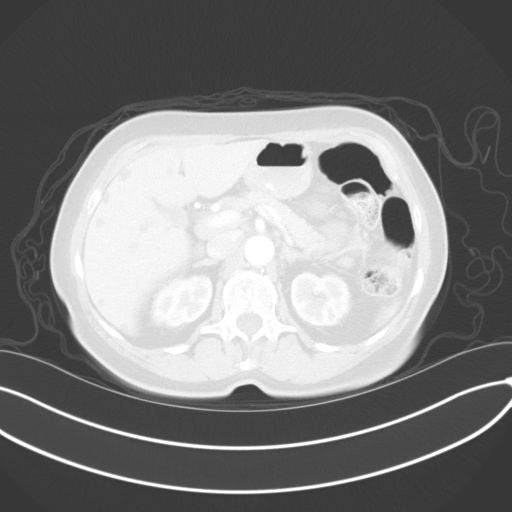
[im 80/126  lung]
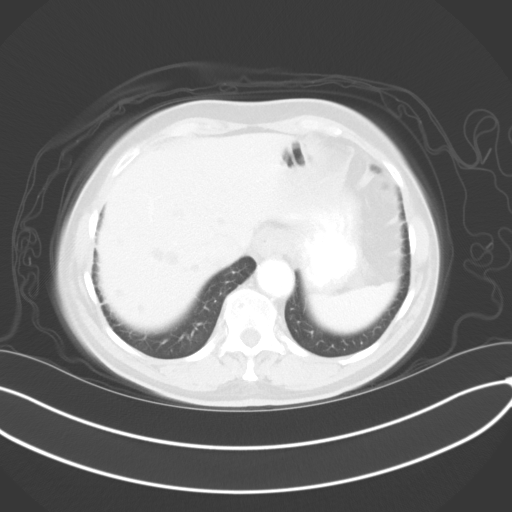
[im 91/126  lung]
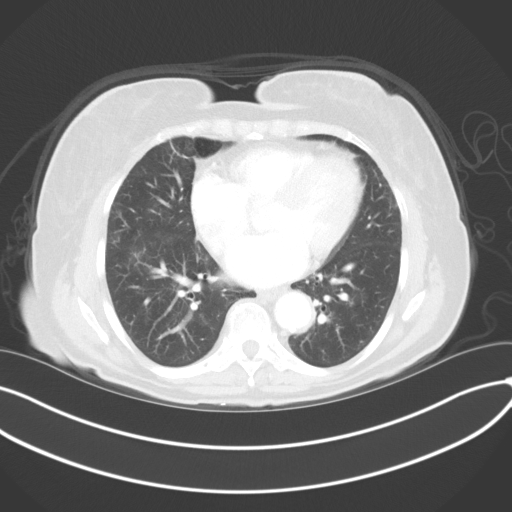
[im 103/126  mediastinal]
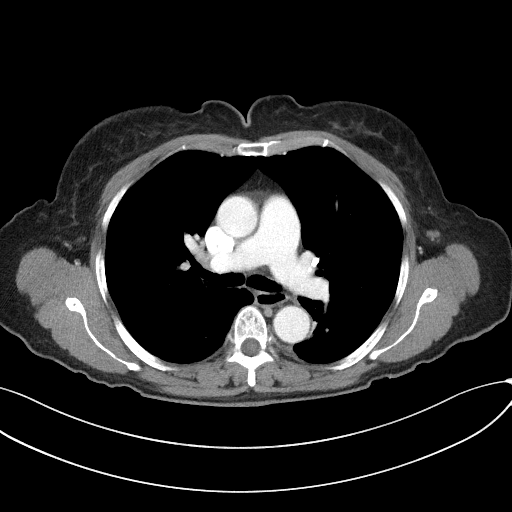
[im 103/126  lung]
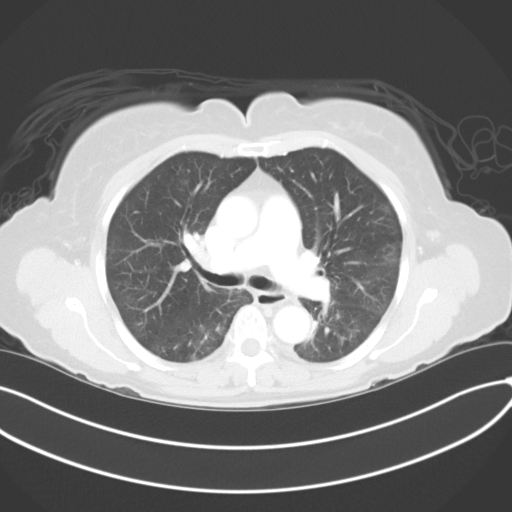
[im 114/126  lung]
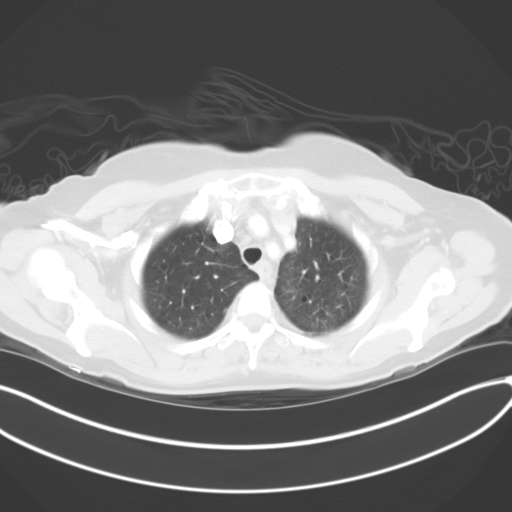

[Series 5: coronals · coronal · 0.89mm/px · 3 of 137 slices shown]
[im 28/137  lung]
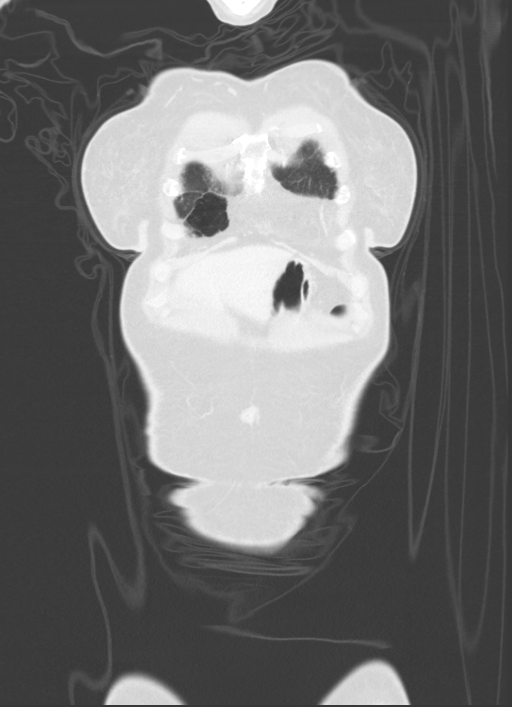
[im 55/137  lung]
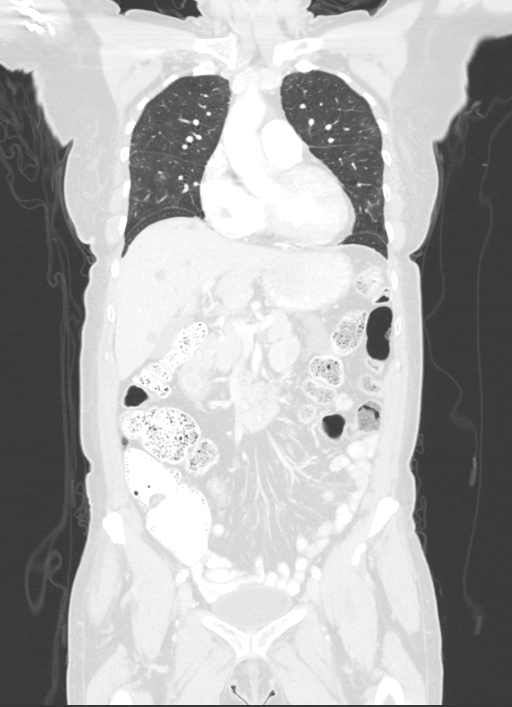
[im 82/137  lung]
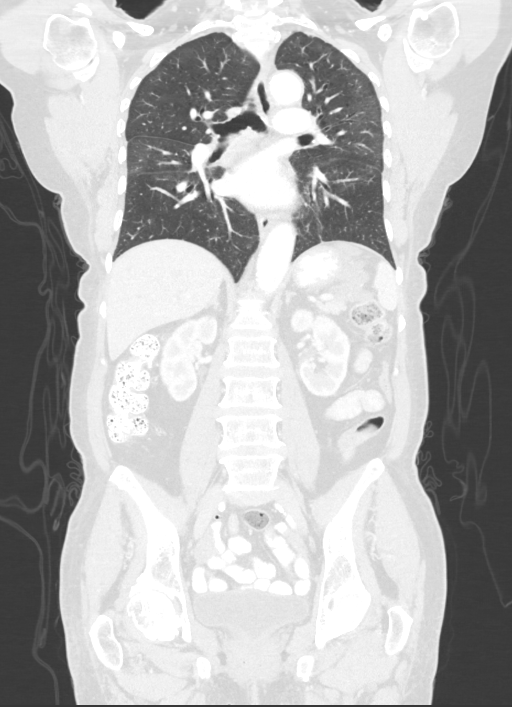

[13 of 36 positions shown; findings below may reference images not displayed]

FINDINGS: CT CHEST FINDINGS

Cardiovascular: Atherosclerotic calcification of the arterial
vasculature, including coronary arteries. Aortic valvular
calcification. Heart and pulmonary arteries are enlarged. No
pericardial effusion.

Mediastinum/Nodes: Esophagus is grossly unremarkable. Tiny hiatal
hernia. No pathologically enlarged mediastinal, hilar or axillary
lymph nodes.

Lungs/Pleura: Mild centrilobular emphysema. Slight improvement in
mid and lower lung zone predominant peribronchovascular
ground-glass, without complete resolution. 4 mm right upper lobe
nodule, unchanged. No pleural fluid. Airway is unremarkable.

Musculoskeletal: No worrisome lytic or sclerotic lesions.

CT ABDOMEN PELVIS FINDINGS

Hepatobiliary: Multiple low-attenuation lesions in the liver measure
up to 12 mm, as before. Gallbladder is unremarkable. No biliary
ductal dilatation.

Pancreas: Negative.

Spleen: Negative.

Adrenals/Urinary Tract: Adrenal glands and kidneys are unremarkable.
Ureters are decompressed. Bladder is grossly unremarkable.

Stomach/Bowel: Tiny hiatal hernia. Exophytic mass off the posterior
wall of the stomach measures 6.1 x 7.1 cm, stable. Nodularity
extends inferiorly into the small bowel mesentery, measuring up to
2.0 x 2.6 cm (series 2, image 61), previously 2.5 x 3.0 cm. Stomach,
small bowel, appendix and colon are otherwise unremarkable.

Vascular/Lymphatic: Atherosclerotic calcification of the arterial
vasculature. Retroperitoneal lymph nodes are not enlarged by CT size
criteria.

Reproductive: Uterus is visualized.  No adnexal mass.

Other: Probable peritoneal implant in the dependent aspect of the
anatomic pelvis (series 2, image 103), as before. Additional
mesenteric implants, not previously mentioned, measure up to 2.6 cm
in the low central abdomen (image 78), stable. 9 mm nodule seen in
left paracolic gutter (image 61), stable.

Musculoskeletal: No worrisome lytic or sclerotic lesions.
Degenerative changes are seen in the spine and hips.
IMPRESSION: 1. Nodular appearing GI stromal tumor along the posterior gastric
wall with associated peritoneal implants and hepatic metastases,
stable from 07/16/2016.
2. Improving mid and lower lung zone predominant peribronchovascular
ground-glass, possibly related to drug therapy.
3. A few scattered pulmonary nodules are stable.
4. Aortic atherosclerosis ([id number]). Coronary artery
calcification.

## 2017-11-03 NOTE — Progress Notes (Signed)
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      04/09/2016 Initial Biopsy    Gastric mass biopsy showed gastrointestinal stromal tumor, I feel mitosis and necrosis, intermediate to high-grade.      04/09/2016 Imaging    CT chest, abdomen and pelvis with contrast showed a large exophytic mass in the proximal lateral stomach measuring 10 x 7.4 cm, nodular lesions within omentum and multiple liver nodules are highly suspicious for metastatic disease.      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 Today's study demonstrates a positive response to therapy with regression of the primary gastric neoplasm, as well as regression of numerous  intraperitoneal metastatic lesions. Additionally, the liver lesions seen on the prior study appear far more well-defined, decreased in size and are much lower attenuation, suggesting internal areas of necrosis. 2. Several small pulmonary nodules are noted, some of which are new compared to the prior examination. These are nonspecific, but metastatic disease to the lungs is not excluded. Attention on follow-up imaging is recommended. 3. Importantly, there is also widespread ground-glass attenuation and septal thickening in the lungs bilaterally which is new compared to the prior study. In the appropriate clinical setting, this could reflect a drug reaction. Other differential considerations include atypical infection, or even developing interstitial lung disease. Clinical correlation is suggested. 4. Aortic atherosclerosis, in addition to right coronary artery disease. 5. Mild diffuse bronchial wall thickening with mild centrilobular and paraseptal emphysema; imaging findings suggestive of underlying COPD. 6. Additional incidental findings, as above.      09/22/2016 Imaging     CT CAP W CONTRAST   IMPRESSION: 1. Nodular appearing GI stromal tumor along the posterior gastric wall with associated peritoneal implants and hepatic metastases, stable from 07/16/2016. 2. Improving mid and lower lung zone predominant peribronchovascular ground-glass, possibly related to drug therapy. 3. A few scattered pulmonary nodules are stable. 4. Aortic atherosclerosis (ICD10-170.0). Coronary artery calcification.      01/14/2017 Imaging    CT CAP w Contrast  IMPRESSION: 1. Further decrease in size of large GI stromal tumor arising from the greater curvature of the stomach with improvement in hepatic and multifocal peritoneal  metastases. 2. Further improvement in lower lung predominant peribronchovascular ground-glass density, probably inflammatory (drug reaction or or infectious). 3. No acute  findings. 4.  Aortic Atherosclerosis (ICD10-I70.0).      05/08/2017 Imaging    IMPRESSION: 1. Overall, today's examination is very similar to recent prior studies again demonstrating a large proximal gastric mass with adjacent gastrosplenic ligament lymphadenopathy, multiple treated lesions in the liver, and other intraperitoneal metastases, as discussed above. No definite new sites of metastatic disease are otherwise noted. 2. Aortic atherosclerosis, in addition to right coronary artery disease. 3. Mild cardiomegaly. 4. There are calcifications of the aortic valve. Echocardiographic correlation for evaluation of potential valvular dysfunction may be warranted if clinically indicated. 5. Additional incidental findings, as above. Aortic Atherosclerosis (ICD10-I70.0).      09/07/2017 Imaging    CT CAP W Contrast 09/07/17 IMPRESSION: 1. Large mass extending off the greater curvature of the stomach is similar to prior. Majority of nodularity within the adjacent mesentery is similar. There is one adjacent nodule which has increased in size when compared to prior exam. 2. Grossly unchanged treated hepatic lesions.      History of present illness (04/10/2016):   Haley Mcmillan a 84 y.o.femalewith medical history significantfor anemia, arthritis, colonic polyps, hyperlipidemia, hypertension presents to the emergency department after recent GI visit with worsening abdominal pain. She states the pain is intermittent, started about 2 weeks ago, located in the epigastric area, no significant nausea or change of her bowel habits. Her appetite and energy level has decreased lately, although she is able to take care of herself. When the pain resolves, she remains to be active and do house cleaning etc. he has lost about 30 pounds in the past 10-12 months, and the most weight with loss in the past few months. A CT scan showed a large exophytic mass in proximal lateral stomach, measuring 10 x  7.4 cm, and nodular lesions in the omentum, and a multiple liver lesions suspicious for metastatic disease. She underwent EGD by Dr. Simona Huh yesterday, which showed a large gastric tumor in the fundus, biopsy was done, pathology results still pending.  CURRENT THERAPY: Gleevec '400mg'$  daily, started on 04/22/2016  INTERVAL HISTORY:  HALYNN REITANO returns for follow-up. She presents to the clinic today by herself. She notes she feels well and has no new changes. Her appetite is doing well. She notes she had drunk a cold water and felt sensitivity on her top tooth. This only happened one time 2 weeks ago. She notes her BP was high today, 172/20. She notes she checks her BP at home and when she does it says error. 3 weeks ago it has been 140/70 range. She is currently on lisinopril '20mg'$ . She notes she last saw her PCP recently. She plans to see her dermatologist in 2 weeks. She notes loose stool once a day in the morning between 6-7. This is tolerable for her.     REVIEW OF SYSTEMS:   Constitutional: Denies fevers, chills    Eyes: Denies blurriness of vision Ears, nose, mouth, throat, and face: Denies mucositis or sore throat Respiratory: Denies dyspnea or wheezes  Cardiovascular: Denies palpitation, chest discomfort or lower extremity swelling (+) Regulated, loose stool  Gastrointestinal:  Denies nausea, heartburn   Skin: Denies abnormal skin rashes  Lymphatics: Denies new lymphadenopathy or easy bruising Neurological:Denies numbness, tingling or new weaknesses Behavioral/Psych: Mood is stable, no new changes  All other systems were reviewed with the patient and are negative.   MEDICAL  HISTORY:  Past Medical History:  Diagnosis Date  . Anemia   . Arthritis   . Colon polyps   . Olecranon bursitis    Elbow  . Other and unspecified hyperlipidemia   . Palpitations   . Unspecified essential hypertension     SURGICAL HISTORY: Past Surgical History:  Procedure Laterality Date  .  COLONOSCOPY W/ POLYPECTOMY  01/2004  . CRYOTHERAPY     female-cryosurgery stated by pt  . ESOPHAGOGASTRODUODENOSCOPY N/A 04/09/2016   Procedure: ESOPHAGOGASTRODUODENOSCOPY (EGD);  Surgeon: Doran Stabler, MD;  Location[facility]COPY;  Service: Endoscopy;  Laterality: N/A;  . POLYPECTOMY     from vocal surgery    I have reviewed the social history and family history with the patient and they are unchanged from previous note.  She stopped smoking in 1996 and began smoking sometime when she was a teenager.  ALLERGIES:  has No Known Allergies.  MEDICATIONS:  Current Outpatient Medications  Medication Sig Dispense Refill  . Cholecalciferol (VITAMIN D3) 5000 units CAPS Take 5,000 Units by mouth daily.     . Cyanocobalamin (VITAMIN B 12 PO) Take 1 tablet by mouth daily.    Marland Kitchen imatinib (GLEEVEC) 400 MG tablet TAKE 1 TABLET BY MOUTH ONCE DAILY. TAKE WITH MEALS AND LARGE GLASS OF WATER. CAUTION:CHEMOTHERAPY 30 tablet 3  . lisinopril (PRINIVIL,ZESTRIL) 20 MG tablet Take 1 tablet (20 mg total) by mouth daily. 90 tablet 3  . Selenium Sulfide 2.25 % SHAM Apply 10 mLs topically 2 (two) times a week. 180 mL 6  . triamcinolone cream (KENALOG) 0.1 % Apply 1 application topically 2 (two) times daily. 453.6 g 0   No current facility-administered medications for this visit.     PHYSICAL EXAMINATION:  ECOG PERFORMANCE STATUS: 0  Vitals:   11/04/17 1105  BP: (!) 172/70  Pulse: 61  Resp: 18  Temp: 98 F (36.7 C)  SpO2: 97%   Filed Weights   11/04/17 1105  Weight: 161 lb 11.2 oz (73.3 kg)     GENERAL:alert. SKIN: skin color, texture, turgor are normal, no rashes or significant lesions EYES: normal, Conjunctiva are pink and non-injected, sclera clear OROPHARYNX:no exudate, no erythema and lips, buccal mucosa, and tongue normal  NECK: supple, thyroid normal size, non-tender, without nodularity LYMPH:  no palpable lymphadenopathy in the cervical, axillary or inguinal LUNGS: clear to  auscultation and percussion with normal breathing effort HEART: regular rate & rhythm and no murmurs and no lower extremity edema ABDOMEN:abdomen soft, non-tender and normal bowel sounds Musculoskeletal:no cyanosis of digits and no clubbing  NEURO: alert & oriented x 3 with fluent speech, no focal motor/sensory deficits  LABORATORY DATA:  I have reviewed the data as listed CBC Latest Ref Rng & Units 11/04/2017 09/07/2017 07/10/2017  WBC 3.9 - 10.3 K/uL 3.9 4.3 4.7  Hemoglobin 11.6 - 15.9 g/dL 12.3 12.5 12.2  Hematocrit 34.8 - 46.6 % 36.5 36.8 37.0  Platelets 145 - 400 K/uL 149 179 147     CMP Latest Ref Rng & Units 11/04/2017 09/07/2017 07/10/2017  Glucose 70 - 140 mg/dL 85 83 88  BUN 7 - 26 mg/dL '13 10 10  '$ Creatinine 0.60 - 1.10 mg/dL 1.26(H) 1.22(H) 1.25(H)  Sodium 136 - 145 mmol/L 141 144 144  Potassium 3.5 - 5.1 mmol/L 3.9 3.6 4.1  Chloride 98 - 109 mmol/L 109 111(H) 109  CO2 22 - 29 mmol/L '25 24 27  '$ Calcium 8.4 - 10.4 mg/dL 8.8 9.4 8.9  Total Protein 6.4 - 8.3  g/dL 7.1 7.4 7.1  Total Bilirubin 0.2 - 1.2 mg/dL 0.4 0.5 0.3  Alkaline Phos 40 - 150 U/L 63 71 68  AST 5 - 34 U/L '22 23 20  '$ ALT 0 - 55 U/L '12 11 8   '$ PATHOLOGY REPORT  Diagnosis 04/09/2016 Stomach, biopsy - GASTROINTESTINAL STROMAL TUMOR (GIST) - SEE COMMENT. Microscopic Comment The biopsy fragments reveal a hypercellular spindle cell proliferation with cytologic atypia, a few mitoses and necrosis. 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 Immunohistochemical stains were performed revealing that the tumor cells are strongly positive for CD117 and CD34. They are negative for cytokeratin A18, cytokeratin AE1/AE3, desmin, smooth muscle actin, smooth muscle myosin and S100. Dr. Claudette Laws has reviewed the case and concurs with this interpretation. Dr. Annamaria Boots was paged on 04/11/16.   Diagnosis 04/14/2016 Peritoneum, biopsy, Anterior abdomen - GASTROINTESTINAL  STROMAL TUMOR (GIST). - SEE COMMENT. Microscopic Comment The tumor is morphologically identical to the patient's previous biopsy ([id number]). Additional studies can be performed upon clinical request. (JBK:gt, 04/15/16)    RADIOGRAPHIC STUDIES: I have personally reviewed the radiological images as listed and agreed with the findings in the report. No results found.   EGD 04/09/2016 Dr. Loletha Carrow  The esophagus was normal. Findings: A large, fungating and infiltrative, very deeply-ulcerated and necrotic, malignant-appearing mass with stigmata of recent bleeding was found in the gastric fundus. Biopsies were taken with a cold forceps for histology. The duodenal bulb was normal. - Normal esophagus. - Likely malignant gastric tumor in the gastric fundus. extensively biopsied. - Normal duodenal bulb.   CT CAP W CONTRAST  09/22/16 IMPRESSION: 1. Nodular appearing GI stromal tumor along the posterior gastric wall with associated peritoneal implants and hepatic metastases, stable from 07/16/2016. 2. Improving mid and lower lung zone predominant peribronchovascular ground-glass, possibly related to drug therapy. 3. A few scattered pulmonary nodules are stable. 4. Aortic atherosclerosis (ICD10-170.0). Coronary artery Calcification.  CT CAP W Contrast 09/07/17 IMPRESSION: 1. Large mass extending off the greater curvature of the stomach is similar to prior. Majority of nodularity within the adjacent mesentery is similar. There is one adjacent nodule which has increased in size when compared to prior exam. 2. Grossly unchanged treated hepatic lesions.  ASSESSMENT & PLAN:  84 y.o. African-American female, with past medical history of hypertension, otherwise healthy and active, presented with progressive weight loss, fatigue, anorexia and intermittent epigastric pain.  1. Malignant gastrointestinal stromal tumor (GIST) of stomach, with liver and peritoneal metastasis -I previously  reviewed her CT scan findings, gastric mass and peritoneal mass biopsy results, in details with patient and her family members -We previously reviewed the neck to history of GIST, and treatment options. -We previously discussed this is not curable disease, but is very treatable, with mediastinal survival 4-5 years -Her KIT mutation showed exon 11 deletion, which predicts better response to TKI,  I previously reviewed the results in detail with her -She has been tolerating Gleevec very well, no noticeable side effects, except mild anorexia which could be related and occasional cough with phlegm production.  she overall has felt better since she started the [facility] [facility]. -We previously reviewed her CT scan findings from 07/16/2016, this showed a positive response to therapy with regression of the primary gastric neoplasm, as well as regression of numerous intraperitoneal metastatic lesions. But several small pulmonary nodules were noted, some of which are new compared to the prior examination. Also seen, is widespread mild ground-glass attenuation and septal thickening in  the lungs bilaterally which is new compared to the prior study. I'm not sure what the etiology of her pulmonary changes, COPD vs pneumonitis from [place] is possible, she is clinically asymptomatic, I'll continue monitoring. -I previously reviewed her restaging CT scan from 09/22/2016, the image were reviewed in person. She had stable disease in the abdomen, her groundglass changes in bilateral lungs have much improved, no significant evidence of pulmonary metastasis. This is likely related to [place], which has improved, we'll continue monitoring. She is clinically asymptomatic.  - CT scan from 01/14/17 reviewed with pt previously, which showed continuous improvement in the gastric mass and peritoneal mets, no new lesions. She is tolerating Gleevec 400 mg very well, clinically doing very well, without any symptoms, will continue treatment. -CT CAP W  Contrast from 09/07/17 showed a similar large mass extending off the greater curvature of the stomach, similar nodularity within the adjacent mesentery and there is one adjacent nodule which has increased in size. Hepatic lesions are unchanged. I discussed the results with the pt. Her disease is overall stable and she will continue Gleevec 400 mg daily.  -I again discussed that the goal of care is palliative, and she would likely develop resistance to [place] down the road. If her future scan shows progressive disease we will change her Gleevec to another TKI such as Sutent. Plan to repeat next scan in 4 months  -Labs reviewed and stable, Cr at 1.26. Overall adequate to continue with Gleevec.  -Will scan her again in 12/2017. If her creatine worsens before next scan, will hold iv contrast.  -I advised her to drink plenty of water especially after her scan. -F/u in 2 months   2. Anemia, iron deficiency  -Her anemia has overall improved since she started iron pill. Her initial study will consistent with iron deficiency -Her anemia has resolved after oral iron supplement, so she was reduced to 1 ferrous sulfate tablet a day.  -She has been taking iron pill since hospital and her iron levels have improved overall -Hg normal at 12.3 today (11/04/17)  3. HTN -Her BP has been persistently high lately in my office, but she states her blood pressure has been mostly normal at home, she is asymptomatic.   -Continue monitoring her blood pressure at home and follow-up with her primary care physician -Her BP was 172/70 in clinic today (11/04/17).  -Currently on Lisinopril '20mg'$  daily.  -Her BP has not been this high at home or previous visit. I advised her to monitor BP at home and if systolic BP higher than 161 she should contact her PCP.   4. Pulmonary nodules and groundglass change -Found on CT scan in 06/2016. Uncertain etiology. Atypical infection, COPD, vs Gleevec induced pneumonitis are on the differential.  She does not have a clinical presentation of pneumonia. Likely related to [place]  -She is clinically asymptomatic, exam was unremarkable.  -repeated CT scan in 09/2016 and 01/2017 showed significant improvement. She remains to be asymptomatic, we'll continue monitoring -stable pulmonary nodules, groundglass changes have resolved on subsequent scan   5. Dry skin, slight peeling -I previously suggested she uses Vaseline and lotion and wear socks to keep her skin from getting dry. From her medication use.  -Improved   Plan -F/u in 2 months with lab and CT CAP w contrast a few days before. Will hold iv contrast if her GFR<45  -Continue 400 mg Gleevec daily   All questions were answered. The patient knows to call the clinic with any  problems, questions or concerns. No barriers to learning was detected.  I spent 20 minutes counseling the patient face to face. The total time spent in the appointment was 25 minutes and more than 50% was on counseling and review of test results  This document serves as a record of services personally performed by Truitt Merle, MD. It was created on her behalf by Joslyn Devon, a trained medical scribe. The creation of this record is based on the scribe's personal observations and the provider's statements to them.   I have reviewed the above documentation for accuracy and completeness, and I agree with the above.    Truitt Merle  11/04/17

## 2017-11-04 ENCOUNTER — Encounter: Payer: Self-pay | Admitting: Hematology

## 2017-11-04 ENCOUNTER — Inpatient Hospital Stay: Payer: Medicare Other | Attending: Nurse Practitioner | Admitting: Hematology

## 2017-11-04 ENCOUNTER — Inpatient Hospital Stay: Payer: Medicare Other

## 2017-11-04 ENCOUNTER — Telehealth: Payer: Self-pay | Admitting: Hematology

## 2017-11-04 VITALS — BP 172/70 | HR 61 | Temp 98.0°F | Resp 18 | Ht 66.5 in | Wt 161.7 lb

## 2017-11-04 DIAGNOSIS — D500 Iron deficiency anemia secondary to blood loss (chronic): Secondary | ICD-10-CM

## 2017-11-04 DIAGNOSIS — C49A2 Gastrointestinal stromal tumor of stomach: Secondary | ICD-10-CM | POA: Insufficient documentation

## 2017-11-04 DIAGNOSIS — E785 Hyperlipidemia, unspecified: Secondary | ICD-10-CM | POA: Diagnosis not present

## 2017-11-04 DIAGNOSIS — I700 Atherosclerosis of aorta: Secondary | ICD-10-CM | POA: Insufficient documentation

## 2017-11-04 DIAGNOSIS — Z86010 Personal history of colonic polyps: Secondary | ICD-10-CM | POA: Diagnosis not present

## 2017-11-04 DIAGNOSIS — D509 Iron deficiency anemia, unspecified: Secondary | ICD-10-CM | POA: Diagnosis not present

## 2017-11-04 DIAGNOSIS — M1990 Unspecified osteoarthritis, unspecified site: Secondary | ICD-10-CM | POA: Diagnosis not present

## 2017-11-04 DIAGNOSIS — I10 Essential (primary) hypertension: Secondary | ICD-10-CM | POA: Insufficient documentation

## 2017-11-04 DIAGNOSIS — Z79899 Other long term (current) drug therapy: Secondary | ICD-10-CM | POA: Insufficient documentation

## 2017-11-04 DIAGNOSIS — C787 Secondary malignant neoplasm of liver and intrahepatic bile duct: Secondary | ICD-10-CM | POA: Diagnosis not present

## 2017-11-04 DIAGNOSIS — J449 Chronic obstructive pulmonary disease, unspecified: Secondary | ICD-10-CM | POA: Diagnosis not present

## 2017-11-04 DIAGNOSIS — C786 Secondary malignant neoplasm of retroperitoneum and peritoneum: Secondary | ICD-10-CM | POA: Insufficient documentation

## 2017-11-04 LAB — COMPREHENSIVE METABOLIC PANEL
ALT: 12 U/L (ref 0–55)
AST: 22 U/L (ref 5–34)
Albumin: 4 g/dL (ref 3.5–5.0)
Alkaline Phosphatase: 63 U/L (ref 40–150)
Anion gap: 7 (ref 3–11)
BUN: 13 mg/dL (ref 7–26)
CHLORIDE: 109 mmol/L (ref 98–109)
CO2: 25 mmol/L (ref 22–29)
CREATININE: 1.26 mg/dL — AB (ref 0.60–1.10)
Calcium: 8.8 mg/dL (ref 8.4–10.4)
GFR calc non Af Amer: 39 mL/min — ABNORMAL LOW (ref >60)
GFR, EST AFRICAN AMERICAN: 45 mL/min — AB (ref >60)
Glucose, Bld: 85 mg/dL (ref 70–140)
POTASSIUM: 3.9 mmol/L (ref 3.5–5.1)
SODIUM: 141 mmol/L (ref 136–145)
Total Bilirubin: 0.4 mg/dL (ref 0.2–1.2)
Total Protein: 7.1 g/dL (ref 6.4–8.3)

## 2017-11-04 LAB — CBC WITH DIFFERENTIAL/PLATELET
BASOS ABS: 0 10*3/uL (ref 0.0–0.1)
Basophils Relative: 1 %
EOS ABS: 0.1 10*3/uL (ref 0.0–0.5)
Eosinophils Relative: 4 %
HCT: 36.5 % (ref 34.8–46.6)
Hemoglobin: 12.3 g/dL (ref 11.6–15.9)
Lymphocytes Relative: 33 %
Lymphs Abs: 1.3 10*3/uL (ref 0.9–3.3)
MCH: 36 pg — AB (ref 25.1–34.0)
MCHC: 33.7 g/dL (ref 31.5–36.0)
MCV: 106.7 fL — ABNORMAL HIGH (ref 79.5–101.0)
Monocytes Absolute: 0.3 10*3/uL (ref 0.1–0.9)
Monocytes Relative: 7 %
Neutro Abs: 2.2 10*3/uL (ref 1.5–6.5)
Neutrophils Relative %: 55 %
PLATELETS: 149 10*3/uL (ref 145–400)
RBC: 3.42 MIL/uL — AB (ref 3.70–5.45)
RDW: 12.8 % (ref 11.2–14.5)
WBC: 3.9 10*3/uL (ref 3.9–10.3)

## 2017-11-04 MED FILL — IMATINIB MESYLATE 400 MG TA: 400 | 30 days supply | Qty: 30 | Fill #1

## 2017-11-04 NOTE — Telephone Encounter (Signed)
Appointments scheduled AVS/Calendar printed per 5/22 los °

## 2017-11-05 ENCOUNTER — Telehealth: Payer: Self-pay | Admitting: Emergency Medicine

## 2017-11-05 NOTE — Telephone Encounter (Signed)
Called patient to schedule AWV. Patient stated she would call back at later date to schedule.

## 2017-12-09 MED FILL — IMATINIB MESYLATE 400 MG TA: 400 | 30 days supply | Qty: 30 | Fill #2

## 2017-12-29 ENCOUNTER — Telehealth: Payer: Self-pay | Admitting: Hematology

## 2017-12-29 NOTE — Telephone Encounter (Signed)
Called regarding voicemail °

## 2017-12-30 ENCOUNTER — Encounter: Payer: Self-pay | Admitting: Internal Medicine

## 2017-12-30 ENCOUNTER — Ambulatory Visit (INDEPENDENT_AMBULATORY_CARE_PROVIDER_SITE_OTHER): Payer: Medicare Other | Admitting: Internal Medicine

## 2017-12-30 DIAGNOSIS — I10 Essential (primary) hypertension: Secondary | ICD-10-CM

## 2017-12-30 DIAGNOSIS — C49A2 Gastrointestinal stromal tumor of stomach: Secondary | ICD-10-CM | POA: Diagnosis not present

## 2017-12-30 NOTE — Patient Instructions (Signed)
We do not need to make any changes today.    

## 2017-12-30 NOTE — Progress Notes (Signed)
   Subjective:    Patient ID: Haley Mcmillan, female    DOB: [birth year], 84 y.o.   MRN: [id number]  HPI The patient is an 84 YO female coming in for follow up of her blood pressure. She was advised by her oncologist to come and see if her therapy needs adjustment. She does have high blood pressure when she is waiting for her CT scans and in the week before she hears the results from the doctor in the office. She denies feeling bad when this happens. Denies headaches or chest pains. BP is normally okay both at home and in the office. She does get very anxious about these tests as this could change her life.   Review of Systems  Constitutional: Negative.   HENT: Negative.   Eyes: Negative.   Respiratory: Negative for cough, chest tightness and shortness of breath.   Cardiovascular: Negative for chest pain, palpitations and leg swelling.  Gastrointestinal: Negative for abdominal distention, abdominal pain, constipation, diarrhea, nausea and vomiting.  Musculoskeletal: Negative.   Skin: Negative.   Neurological: Negative.   Psychiatric/Behavioral: Negative.       Objective:   Physical Exam  Constitutional: She is oriented to person, place, and time. She appears well-developed and well-nourished.  HENT:  Head: Normocephalic and atraumatic.  Eyes: EOM are normal.  Neck: Normal range of motion.  Cardiovascular: Normal rate and regular rhythm.  Pulmonary/Chest: Effort normal and breath sounds normal. No respiratory distress. She has no wheezes. She has no rales.  Abdominal: Soft. Bowel sounds are normal. She exhibits no distension. There is no tenderness. There is no rebound.  Musculoskeletal: She exhibits no edema.  Neurological: She is alert and oriented to person, place, and time. Coordination normal.  Skin: Skin is warm and dry.  Psychiatric: She has a normal mood and affect.   Vitals:   12/30/17 1001  BP: 120/70  Pulse: (!) 53  Temp: 97.9 F (36.6 C)  TempSrc: Oral  Weight:  159 lb (72.1 kg)  Height: 5' 6.5" (1.689 m)      Assessment & Plan:

## 2017-12-31 NOTE — Assessment & Plan Note (Signed)
She does have CT testing next week to check for any recurrence/progression of her illness which is very stressful to her. 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

## 2017-12-31 NOTE — Assessment & Plan Note (Signed)
Will continue lisinopril 20 mg daily on normal day and she can increase to 2 pills on the week between CT testing and oncology appointment to negate the effect of anxiety on her BP. Recent BMP without indication for dosing change.

## 2018-01-04 ENCOUNTER — Other Ambulatory Visit: Payer: Self-pay | Admitting: Hematology

## 2018-01-04 ENCOUNTER — Inpatient Hospital Stay: Payer: Medicare Other | Attending: Nurse Practitioner

## 2018-01-04 ENCOUNTER — Ambulatory Visit (HOSPITAL_COMMUNITY)
Admission: RE | Admit: 2018-01-04 | Discharge: 2018-01-04 | Disposition: A | Discharge status: Home / Self Care | Payer: Medicare Other | Source: Ambulatory Visit | Attending: Hematology | Admitting: Hematology

## 2018-01-04 DIAGNOSIS — Z79899 Other long term (current) drug therapy: Secondary | ICD-10-CM | POA: Diagnosis not present

## 2018-01-04 DIAGNOSIS — R918 Other nonspecific abnormal finding of lung field: Secondary | ICD-10-CM | POA: Insufficient documentation

## 2018-01-04 DIAGNOSIS — Z862 Personal history of diseases of the blood and blood-forming organs and certain disorders involving the immune mechanism: Secondary | ICD-10-CM | POA: Insufficient documentation

## 2018-01-04 DIAGNOSIS — C49A2 Gastrointestinal stromal tumor of stomach: Secondary | ICD-10-CM

## 2018-01-04 DIAGNOSIS — C786 Secondary malignant neoplasm of retroperitoneum and peritoneum: Secondary | ICD-10-CM | POA: Diagnosis not present

## 2018-01-04 DIAGNOSIS — I10 Essential (primary) hypertension: Secondary | ICD-10-CM | POA: Insufficient documentation

## 2018-01-04 DIAGNOSIS — C787 Secondary malignant neoplasm of liver and intrahepatic bile duct: Secondary | ICD-10-CM | POA: Diagnosis not present

## 2018-01-04 LAB — COMPREHENSIVE METABOLIC PANEL
ALBUMIN: 4.2 g/dL (ref 3.5–5.0)
ALK PHOS: 69 U/L (ref 38–126)
ALT: 11 U/L (ref 0–44)
ANION GAP: 9 (ref 5–15)
AST: 24 U/L (ref 15–41)
BILIRUBIN TOTAL: 0.5 mg/dL (ref 0.3–1.2)
BUN: 10 mg/dL (ref 8–23)
CO2: 26 mmol/L (ref 22–32)
Calcium: 9.2 mg/dL (ref 8.9–10.3)
Chloride: 108 mmol/L (ref 98–111)
Creatinine, Ser: 1.29 mg/dL — ABNORMAL HIGH (ref 0.44–1.00)
GFR, EST AFRICAN AMERICAN: 43 mL/min — AB (ref >60)
GFR, EST NON AFRICAN AMERICAN: 37 mL/min — AB (ref >60)
GLUCOSE: 91 mg/dL (ref 70–99)
POTASSIUM: 3.8 mmol/L (ref 3.5–5.1)
Sodium: 143 mmol/L (ref 135–145)
TOTAL PROTEIN: 7.5 g/dL (ref 6.5–8.1)

## 2018-01-04 LAB — CBC WITH DIFFERENTIAL/PLATELET
Basophils Absolute: 0 10*3/uL (ref 0.0–0.1)
Basophils Relative: 1 %
Eosinophils Absolute: 0.1 10*3/uL (ref 0.0–0.5)
Eosinophils Relative: 3 %
HCT: 37.9 % (ref 34.8–46.6)
Hemoglobin: 12.6 g/dL (ref 11.6–15.9)
Lymphocytes Relative: 38 %
Lymphs Abs: 1.6 10*3/uL (ref 0.9–3.3)
MCH: 35.6 pg — ABNORMAL HIGH (ref 25.1–34.0)
MCHC: 33.2 g/dL (ref 31.5–36.0)
MCV: 107.1 fL — ABNORMAL HIGH (ref 79.5–101.0)
Monocytes Absolute: 0.4 10*3/uL (ref 0.1–0.9)
Monocytes Relative: 9 %
Neutro Abs: 2.1 10*3/uL (ref 1.5–6.5)
Neutrophils Relative %: 49 %
Platelets: 153 10*3/uL (ref 145–400)
RBC: 3.54 MIL/uL — ABNORMAL LOW (ref 3.70–5.45)
RDW: 12.5 % (ref 11.2–14.5)
WBC: 4.2 10*3/uL (ref 3.9–10.3)

## 2018-01-04 MED FILL — IMATINIB MESYLATE 400 MG TA: 400 | 30 days supply | Qty: 30 | Fill #3

## 2018-01-05 NOTE — Progress Notes (Signed)
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      04/09/2016 Initial Biopsy    Gastric mass biopsy showed gastrointestinal stromal tumor, I feel mitosis and necrosis, intermediate to high-grade.      04/09/2016 Imaging    CT chest, abdomen and pelvis with contrast showed a large exophytic mass in the proximal lateral stomach measuring 10 x 7.4 cm, nodular lesions within omentum and multiple liver nodules are highly suspicious for metastatic disease.      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 Today's study demonstrates a positive response to therapy with regression of the primary gastric neoplasm, as well as regression of numerous  intraperitoneal metastatic lesions. Additionally, the liver lesions seen on the prior study appear far more well-defined, decreased in size and are much lower attenuation, suggesting internal areas of necrosis. 2. Several small pulmonary nodules are noted, some of which are new compared to the prior examination. These are nonspecific, but metastatic disease to the lungs is not excluded. Attention on follow-up imaging is recommended. 3. Importantly, there is also widespread ground-glass attenuation and septal thickening in the lungs bilaterally which is new compared to the prior study. In the appropriate clinical setting, this could reflect a drug reaction. Other differential considerations include atypical infection, or even developing interstitial lung disease. Clinical correlation is suggested. 4. Aortic atherosclerosis, in addition to right coronary artery disease. 5. Mild diffuse bronchial wall thickening with mild centrilobular and paraseptal emphysema; imaging findings suggestive of underlying COPD. 6. Additional incidental findings, as above.      09/22/2016 Imaging     CT CAP W CONTRAST   IMPRESSION: 1. Nodular appearing GI stromal tumor along the posterior gastric wall with associated peritoneal implants and hepatic metastases, stable from 07/16/2016. 2. Improving mid and lower lung zone predominant peribronchovascular ground-glass, possibly related to drug therapy. 3. A few scattered pulmonary nodules are stable. 4. Aortic atherosclerosis (ICD10-170.0). Coronary artery calcification.      01/14/2017 Imaging    CT CAP w Contrast  IMPRESSION: 1. Further decrease in size of large GI stromal tumor arising from the greater curvature of the stomach with improvement in hepatic and multifocal peritoneal  metastases. 2. Further improvement in lower lung predominant peribronchovascular ground-glass density, probably inflammatory (drug reaction or or infectious). 3. No acute  findings. 4.  Aortic Atherosclerosis (ICD10-I70.0).      05/08/2017 Imaging    IMPRESSION: 1. Overall, today's examination is very similar to recent prior studies again demonstrating a large proximal gastric mass with adjacent gastrosplenic ligament lymphadenopathy, multiple treated lesions in the liver, and other intraperitoneal metastases, as discussed above. No definite new sites of metastatic disease are otherwise noted. 2. Aortic atherosclerosis, in addition to right coronary artery disease. 3. Mild cardiomegaly. 4. There are calcifications of the aortic valve. Echocardiographic correlation for evaluation of potential valvular dysfunction may be warranted if clinically indicated. 5. Additional incidental findings, as above. Aortic Atherosclerosis (ICD10-I70.0).      09/07/2017 Imaging    CT CAP W Contrast 09/07/17 IMPRESSION: 1. Large mass extending off the greater curvature of the stomach is similar to prior. Majority of nodularity within the adjacent mesentery is similar. There is one adjacent nodule which has increased in size when compared to prior exam. 2. Grossly unchanged treated hepatic lesions.      01/04/2018 Imaging    CT CAP WO COntrast 01/04/18 IMPRESSION: 1. No substantial interval change in exam. 2. Irregular mass at the proximal stomach is similar to prior. Scattered soft tissue lesions in the perigastric reason, omentum, and mesentery are similar to prior. No generalized trend towards worsening or improving disease. No new or progressive findings on today's study. 3. Stable appearance multiple hepatic lesions. 4.  Aortic Atherosclerois (ICD10-170.0)      History of present illness (04/10/2016):   Haley Ludolph Cravenis a 84 y.o.femalewith medical history significantfor anemia, arthritis, colonic polyps, hyperlipidemia, hypertension presents to the emergency department after recent GI visit with worsening abdominal pain. She states the pain is  intermittent, started about 2 weeks ago, located in the epigastric area, no significant nausea or change of her bowel habits. Her appetite and energy level has decreased lately, although she is able to take care of herself. When the pain resolves, she remains to be active and do house cleaning etc. he has lost about 30 pounds in the past 10-12 months, and the most weight with loss in the past few months. A CT scan showed a large exophytic mass in proximal lateral stomach, measuring 10 x 7.4 cm, and nodular lesions in the omentum, and a multiple liver lesions suspicious for metastatic disease. She underwent EGD by Dr. Simona Huh yesterday, which showed a large gastric tumor in the fundus, biopsy was done, pathology results still pending.  CURRENT THERAPY: Gleevec '400mg'$  daily, started on 04/22/2016  INTERVAL HISTORY:  Haley Mcmillan returns for follow-up of metastatic gastric GIST. She presents to the clinic today by herself. She notes she is concerned with her BP as it is 179/65 today. She notes this is related to her anxiety and her being nervous. She notes she has not been eating much except salad mostly so she has been losing well. Her appetite is well, she just doesn't cook. She is still able to do anything she wants to do. She denies abdominal issues or new concerning pain. She is overall doing well.       REVIEW OF SYSTEMS:   Constitutional: Denies fevers, chills (+) weight loss  Eyes: Denies blurriness of vision Ears, nose, mouth, throat, and face: Denies mucositis or sore throat Respiratory: Denies dyspnea or wheezes  Cardiovascular: Denies palpitation, chest discomfort or lower extremity swelling  Gastrointestinal:  Denies nausea,  heartburn   Skin: Denies abnormal skin rashes  Lymphatics: Denies new lymphadenopathy or easy bruising Neurological:Denies numbness, tingling or new weaknesses Behavioral/Psych: Mood is stable, no new changes  All other systems were reviewed with the patient and  are negative.   MEDICAL HISTORY:  Past Medical History:  Diagnosis Date  . Anemia   . Arthritis   . Colon polyps   . Olecranon bursitis    Elbow  . Other and unspecified hyperlipidemia   . Palpitations   . Unspecified essential hypertension     SURGICAL HISTORY: Past Surgical History:  Procedure Laterality Date  . COLONOSCOPY W/ POLYPECTOMY  01/2004  . CRYOTHERAPY     female-cryosurgery stated by pt  . ESOPHAGOGASTRODUODENOSCOPY N/A 04/09/2016   Procedure: ESOPHAGOGASTRODUODENOSCOPY (EGD);  Surgeon: Doran Stabler, MD;  Location[facility]COPY;  Service: Endoscopy;  Laterality: N/A;  . POLYPECTOMY     from vocal surgery    I have reviewed the social history and family history with the patient and they are unchanged from previous note.  She stopped smoking in 1996 and began smoking sometime when she was a teenager.  ALLERGIES:  has No Known Allergies.  MEDICATIONS:  Current Outpatient Medications  Medication Sig Dispense Refill  . Cholecalciferol (VITAMIN D3) 5000 units CAPS Take 5,000 Units by mouth daily.     . Cyanocobalamin (VITAMIN B 12 PO) Take 1 tablet by mouth daily.    Marland Kitchen imatinib (GLEEVEC) 400 MG tablet Take with meals and large glass of water.Caution:Chemotherapy. 30 tablet 5  . lisinopril (PRINIVIL,ZESTRIL) 20 MG tablet Take 1 tablet (20 mg total) by mouth daily. 90 tablet 3  . Selenium Sulfide 2.25 % SHAM Apply 10 mLs topically 2 (two) times a week. 180 mL 6  . triamcinolone cream (KENALOG) 0.1 % Apply 1 application topically 2 (two) times daily. (Patient not taking: Reported on 01/06/2018) 453.6 g 0   No current facility-administered medications for this visit.     PHYSICAL EXAMINATION:  ECOG PERFORMANCE STATUS: 0  Vitals:   01/06/18 1043 01/06/18 1105  BP: (!) 179/65 (!) 170/69  Pulse: 63   Resp: 18   Temp: 98.2 F (36.8 C)    Filed Weights   01/06/18 1043  Weight: 158 lb 14.4 oz (72.1 kg)     GENERAL:alert. SKIN: skin color, texture,  turgor are normal, no rashes or significant lesions EYES: normal, Conjunctiva are pink and non-injected, sclera clear OROPHARYNX:no exudate, no erythema and lips, buccal mucosa, and tongue normal  NECK: supple, thyroid normal size, non-tender, without nodularity LYMPH:  no palpable lymphadenopathy in the cervical, axillary or inguinal LUNGS: clear to auscultation and percussion with normal breathing effort HEART: regular rate & rhythm and no murmurs and no lower extremity edema ABDOMEN:abdomen soft, non-tender and normal bowel sounds Musculoskeletal:no cyanosis of digits and no clubbing  NEURO: alert & oriented x 3 with fluent speech, no focal motor/sensory deficits  LABORATORY DATA:  I have reviewed the data as listed CBC Latest Ref Rng & Units 01/04/2018 11/04/2017 09/07/2017  WBC 3.9 - 10.3 K/uL 4.2 3.9 4.3  Hemoglobin 11.6 - 15.9 g/dL 12.6 12.3 12.5  Hematocrit 34.8 - 46.6 % 37.9 36.5 36.8  Platelets 145 - 400 K/uL 153 149 179     CMP Latest Ref Rng & Units 01/04/2018 11/04/2017 09/07/2017  Glucose 70 - 99 mg/dL 91 85 83  BUN 8 - 23 mg/dL '10 13 10  '$ Creatinine 0.44 - 1.00 mg/dL 1.29(H) 1.26(H) 1.22(H)  Sodium 135 - 145  mmol/L 143 141 144  Potassium 3.5 - 5.1 mmol/L 3.8 3.9 3.6  Chloride 98 - 111 mmol/L 108 109 111(H)  CO2 22 - 32 mmol/L '26 25 24  '$ Calcium 8.9 - 10.3 mg/dL 9.2 8.8 9.4  Total Protein 6.5 - 8.1 g/dL 7.5 7.1 7.4  Total Bilirubin 0.3 - 1.2 mg/dL 0.5 0.4 0.5  Alkaline Phos 38 - 126 U/L 69 63 71  AST 15 - 41 U/L '24 22 23  '$ ALT 0 - 44 U/L '11 12 11   '$ PATHOLOGY REPORT  Diagnosis 04/09/2016 Stomach, biopsy - GASTROINTESTINAL STROMAL TUMOR (GIST) - SEE COMMENT. Microscopic Comment The biopsy fragments reveal a hypercellular spindle cell proliferation with cytologic atypia, a few mitoses and necrosis. 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 Immunohistochemical stains were performed revealing that the tumor cells  are strongly positive for CD117 and CD34. They are negative for cytokeratin A18, cytokeratin AE1/AE3, desmin, smooth muscle actin, smooth muscle myosin and S100. Dr. Claudette Laws has reviewed the case and concurs with this interpretation. Dr. Annamaria Boots was paged on 04/11/16.   Diagnosis 04/14/2016 Peritoneum, biopsy, Anterior abdomen - GASTROINTESTINAL STROMAL TUMOR (GIST). - SEE COMMENT. Microscopic Comment The tumor is morphologically identical to the patient's previous biopsy ([id number]). Additional studies can be performed upon clinical request. (JBK:gt, 04/15/16)    RADIOGRAPHIC STUDIES: I have personally reviewed the radiological images as listed and agreed with the findings in the report. Ct Abdomen Pelvis Wo Contrast  Result Date: 01/05/2018 CLINICAL DATA:  GI stromal tumor. EXAM: CT CHEST, ABDOMEN AND PELVIS WITHOUT CONTRAST TECHNIQUE: Multidetector CT imaging of the chest, abdomen and pelvis was performed following the standard protocol without IV contrast. COMPARISON:  09/07/2017 FINDINGS: CT CHEST FINDINGS Cardiovascular: The heart size is normal. No substantial pericardial effusion. Atherosclerotic calcification is noted in the wall of the thoracic aorta. Mediastinum/Nodes: No mediastinal lymphadenopathy. No evidence for gross hilar lymphadenopathy although assessment is limited by the lack of intravenous contrast on today's study. The visualized paranasal sinuses and mastoid air cells are clear. There is no axillary lymphadenopathy. Lungs/Pleura: The central tracheobronchial airways are patent. Tiny pulmonary nodules are stable. Index right upper lobe nodule (4:45) is stable with 3 mm. Index 5 mm right lower lobe nodule (4:39) is unchanged. No focal consolidation. No pleural effusion. Musculoskeletal: No worrisome lytic or sclerotic osseous abnormality. CT ABDOMEN PELVIS FINDINGS Hepatobiliary: Scattered hypodense lesions in the liver parenchyma are unchanged. Index 10 mm lateral  segment left liver lesion (2:2) was 10 mm when remeasured on the prior study. There is no evidence for gallstones, gallbladder wall thickening, or pericholecystic fluid. No intrahepatic or extrahepatic biliary dilation. Pancreas: No focal mass lesion. No dilatation of the main duct. No intraparenchymal cyst. No peripancreatic edema. Spleen: No splenomegaly. No focal mass lesion. Adrenals/Urinary Tract: No adrenal nodule or mass. Small cyst left kidney is stable. Right kidney unremarkable. No evidence for hydroureter. The urinary bladder appears normal for the degree of distention. Stomach/Bowel: Proximal gastric mass is similar to prior. Irregular margins make reproducible measurement difficult. Lesion is 4.5 x 3.7 cm today compared to 0.6 x 4.2 cm previously. Duodenum is normally positioned as is the ligament of Treitz. No small bowel wall thickening. No small bowel dilatation. The terminal ileum is normal. The appendix is normal. No gross colonic mass. No colonic wall thickening. No substantial diverticular change. Vascular/Lymphatic: There is abdominal aortic atherosclerosis without aneurysm. No hepato duodenal ligament lymphadenopathy. No retroperitoneal lymphadenopathy. No pelvic sidewall lymphadenopathy. Reproductive:  The uterus has normal CT imaging appearance. There is no adnexal mass. Other: Multiple perigastric, omental, and mesenteric nodules again identified. 1.7 cm posterior perigastric nodule (2:51) is unchanged. 2 cm mass described previously along the transverse colon is 1.9 cm today (2:69). 2.2 cm nodule deep to the umbilicus was measured at 2.5 cm previously. Musculoskeletal: No worrisome lytic or sclerotic osseous abnormality. IMPRESSION: 1. No substantial interval change in exam. 2. Irregular mass at the proximal stomach is similar to prior. Scattered soft tissue lesions in the perigastric reason, omentum, and mesentery are similar to prior. No generalized trend towards worsening or improving  disease. No new or progressive findings on today's study. 3. Stable appearance multiple hepatic lesions. 4.  Aortic Atherosclerois (ICD10-170.0) Electronically Signed   By: Misty Stanley M.D.   On: 01/05/2018 09:29   Ct Chest Wo Contrast  Result Date: 01/05/2018 CLINICAL DATA:  GI stromal tumor. EXAM: CT CHEST, ABDOMEN AND PELVIS WITHOUT CONTRAST TECHNIQUE: Multidetector CT imaging of the chest, abdomen and pelvis was performed following the standard protocol without IV contrast. COMPARISON:  09/07/2017 FINDINGS: CT CHEST FINDINGS Cardiovascular: The heart size is normal. No substantial pericardial effusion. Atherosclerotic calcification is noted in the wall of the thoracic aorta. Mediastinum/Nodes: No mediastinal lymphadenopathy. No evidence for gross hilar lymphadenopathy although assessment is limited by the lack of intravenous contrast on today's study. The visualized paranasal sinuses and mastoid air cells are clear. There is no axillary lymphadenopathy. Lungs/Pleura: The central tracheobronchial airways are patent. Tiny pulmonary nodules are stable. Index right upper lobe nodule (4:45) is stable with 3 mm. Index 5 mm right lower lobe nodule (4:39) is unchanged. No focal consolidation. No pleural effusion. Musculoskeletal: No worrisome lytic or sclerotic osseous abnormality. CT ABDOMEN PELVIS FINDINGS Hepatobiliary: Scattered hypodense lesions in the liver parenchyma are unchanged. Index 10 mm lateral segment left liver lesion (2:2) was 10 mm when remeasured on the prior study. There is no evidence for gallstones, gallbladder wall thickening, or pericholecystic fluid. No intrahepatic or extrahepatic biliary dilation. Pancreas: No focal mass lesion. No dilatation of the main duct. No intraparenchymal cyst. No peripancreatic edema. Spleen: No splenomegaly. No focal mass lesion. Adrenals/Urinary Tract: No adrenal nodule or mass. Small cyst left kidney is stable. Right kidney unremarkable. No evidence for  hydroureter. The urinary bladder appears normal for the degree of distention. Stomach/Bowel: Proximal gastric mass is similar to prior. Irregular margins make reproducible measurement difficult. Lesion is 4.5 x 3.7 cm today compared to 0.6 x 4.2 cm previously. Duodenum is normally positioned as is the ligament of Treitz. No small bowel wall thickening. No small bowel dilatation. The terminal ileum is normal. The appendix is normal. No gross colonic mass. No colonic wall thickening. No substantial diverticular change. Vascular/Lymphatic: There is abdominal aortic atherosclerosis without aneurysm. No hepato duodenal ligament lymphadenopathy. No retroperitoneal lymphadenopathy. No pelvic sidewall lymphadenopathy. Reproductive: The uterus has normal CT imaging appearance. There is no adnexal mass. Other: Multiple perigastric, omental, and mesenteric nodules again identified. 1.7 cm posterior perigastric nodule (2:51) is unchanged. 2 cm mass described previously along the transverse colon is 1.9 cm today (2:69). 2.2 cm nodule deep to the umbilicus was measured at 2.5 cm previously. Musculoskeletal: No worrisome lytic or sclerotic osseous abnormality. IMPRESSION: 1. No substantial interval change in exam. 2. Irregular mass at the proximal stomach is similar to prior. Scattered soft tissue lesions in the perigastric reason, omentum, and mesentery are similar to prior. No generalized trend towards worsening or improving disease. No new or progressive  findings on today's study. 3. Stable appearance multiple hepatic lesions. 4.  Aortic Atherosclerois (ICD10-170.0) Electronically Signed   By: Misty Stanley M.D.   On: 01/05/2018 09:29     EGD 04/09/2016 Dr. Loletha Carrow  The esophagus was normal. Findings: A large, fungating and infiltrative, very deeply-ulcerated and necrotic, malignant-appearing mass with stigmata of recent bleeding was found in the gastric fundus. Biopsies were taken with a cold forceps for histology. The  duodenal bulb was normal. - Normal esophagus. - Likely malignant gastric tumor in the gastric fundus. extensively biopsied. - Normal duodenal bulb.   CT CAP W CONTRAST  09/22/16 IMPRESSION: 1. Nodular appearing GI stromal tumor along the posterior gastric wall with associated peritoneal implants and hepatic metastases, stable from 07/16/2016. 2. Improving mid and lower lung zone predominant peribronchovascular ground-glass, possibly related to drug therapy. 3. A few scattered pulmonary nodules are stable. 4. Aortic atherosclerosis (ICD10-170.0). Coronary artery Calcification.  CT CAP W Contrast 09/07/17 IMPRESSION: 1. Large mass extending off the greater curvature of the stomach is similar to prior. Majority of nodularity within the adjacent mesentery is similar. There is one adjacent nodule which has increased in size when compared to prior exam. 2. Grossly unchanged treated hepatic lesions.  ASSESSMENT & PLAN:  84 y.o. African-American female, with past medical history of hypertension, otherwise healthy and active, presented with progressive weight loss, fatigue, anorexia and intermittent epigastric pain.  1. Malignant gastrointestinal stromal tumor (GIST) of stomach, with liver and peritoneal metastasis -I previously reviewed her CT scan findings, gastric mass and peritoneal mass biopsy results, in details with patient and her family members -We previously discussed this is not curable disease, but is very treatable, with mediastinal survival 4-5 years -Her KIT mutation showed exon 11 deletion, which predicts better response to TKI,  I previously reviewed the results in detail with her. -She has been tolerating [place] very well since starting in 04/2016, no noticeable side effects, except mild anorexia which could be related and occasional cough with phlegm production. She overall has felt better since she started the [place]. -Her restaging scans have showed excellent response to  [place] so far. -I again discussed that the goal of care is palliative, and she would likely develop resistance to Gleevec down the road. If her future scan shows progressive disease we will change her Gleevec to another TKI such as Sutent.  -We personally reviewed her restaging CT scan from 01/04/18 and agree with the radiology interpretation of stable disease.  I reviewed the results with patient.  She is pleased. -Gleevec is controlling her disease and she is tolerating well. She will continue Gleevec, refilled today -Labs reviewed from this week, Cr at 1.29, MCH at 35.6 and MCV at 107.1. -F/u in 2 months, plan to repeat staging scan every 4 to 6 months   2. Anemia, iron deficiency  -Her anemia has overall improved since she started iron pill. Her initial study will consistent with iron deficiency -Continue oral ferous sulfate once daily.  -Currently resolved    3. HTN -Her BP has been persistently high lately in my office, but she states her blood pressure has been mostly normal at home, she is asymptomatic.   -Continue monitoring her blood pressure at home and follow-up with her primary care physician. If systolic BP higher than 627 she should contact her PCP.  -Currently on Lisinopril '20mg'$  daily.  -BP high at 179/65 today (01/06/18) likely due to anxiety about latest scan. Nurse will recheck BP in clinic.   4.  Pulmonary nodules and groundglass change -Found on CT scan in 06/2016. Uncertain etiology. Atypical infection, COPD, vs Gleevec induced pneumonitis are on the differential. She does not have a clinical presentation of pneumonia. Likely related to [place]  -repeated CT scan in 09/2016 and 01/2017 showed significant improvement. She remains to be asymptomatic, we'll continue monitoring -stable pulmonary nodules, groundglass changes have resolved on subsequent scan    Plan -Restaging CT scan reviewed, stable disease without new lesion.   -Lab and f/u in 2 months  -Continue 400 mg  Gleevec daily, refilled today    All questions were answered. The patient knows to call the clinic with any problems, questions or concerns. No barriers to learning was detected.  I spent 20 minutes counseling the patient face to face. The total time spent in the appointment was 25 minutes and more than 50% was on counseling and review of test results  I, Joslyn Devon, am acting as scribe for Truitt Merle, MD.   I have reviewed the above documentation for accuracy and completeness, and I agree with the above.     Truitt Merle  01/06/18

## 2018-01-06 ENCOUNTER — Inpatient Hospital Stay (HOSPITAL_BASED_OUTPATIENT_CLINIC_OR_DEPARTMENT_OTHER): Payer: Medicare Other | Admitting: Hematology

## 2018-01-06 ENCOUNTER — Encounter: Payer: Self-pay | Admitting: Hematology

## 2018-01-06 ENCOUNTER — Telehealth: Payer: Self-pay | Admitting: Hematology

## 2018-01-06 ENCOUNTER — Other Ambulatory Visit: Payer: Self-pay | Admitting: Pharmacist

## 2018-01-06 VITALS — BP 170/69 | HR 63 | Temp 98.2°F | Resp 18 | Ht 66.5 in | Wt 158.9 lb

## 2018-01-06 DIAGNOSIS — D500 Iron deficiency anemia secondary to blood loss (chronic): Secondary | ICD-10-CM

## 2018-01-06 DIAGNOSIS — Z862 Personal history of diseases of the blood and blood-forming organs and certain disorders involving the immune mechanism: Secondary | ICD-10-CM

## 2018-01-06 DIAGNOSIS — C786 Secondary malignant neoplasm of retroperitoneum and peritoneum: Secondary | ICD-10-CM | POA: Diagnosis not present

## 2018-01-06 DIAGNOSIS — Z79899 Other long term (current) drug therapy: Secondary | ICD-10-CM

## 2018-01-06 DIAGNOSIS — C49A2 Gastrointestinal stromal tumor of stomach: Secondary | ICD-10-CM

## 2018-01-06 DIAGNOSIS — R918 Other nonspecific abnormal finding of lung field: Secondary | ICD-10-CM

## 2018-01-06 DIAGNOSIS — C787 Secondary malignant neoplasm of liver and intrahepatic bile duct: Secondary | ICD-10-CM | POA: Diagnosis not present

## 2018-01-06 DIAGNOSIS — I10 Essential (primary) hypertension: Secondary | ICD-10-CM | POA: Diagnosis not present

## 2018-01-06 MED ORDER — IMATINIB MESYLATE 400 MG PO TABS: ORAL_TABLET | ORAL | 5 refills | Status: DC

## 2018-01-06 MED ORDER — IMATINIB MESYLATE 400 MG PO TABS: 400.0000 mg | ORAL_TABLET | Freq: Every day | ORAL | 5 refills | Status: DC

## 2018-01-06 NOTE — Telephone Encounter (Signed)
Scheduled appt per 7/24 los - gave patient AVS and calender per los.  

## 2018-02-08 MED FILL — IMATINIB MESYLATE 400 MG TA: 400 | 30 days supply | Qty: 30 | Fill #0

## 2018-02-10 ENCOUNTER — Telehealth: Payer: Self-pay | Admitting: Hematology

## 2018-02-10 NOTE — Telephone Encounter (Signed)
LVm for PT regarding vm she left earlier to verify appt

## 2018-02-27 ENCOUNTER — Emergency Department (HOSPITAL_COMMUNITY)
Admission: EM | Admit: 2018-02-27 | Discharge: 2018-02-27 | Disposition: A | Discharge status: Home / Self Care | Payer: Medicare Other | Attending: Emergency Medicine | Admitting: Emergency Medicine

## 2018-02-27 ENCOUNTER — Encounter (HOSPITAL_COMMUNITY): Payer: Self-pay

## 2018-02-27 ENCOUNTER — Other Ambulatory Visit: Payer: Self-pay

## 2018-02-27 DIAGNOSIS — Z79899 Other long term (current) drug therapy: Secondary | ICD-10-CM | POA: Insufficient documentation

## 2018-02-27 DIAGNOSIS — Z87891 Personal history of nicotine dependence: Secondary | ICD-10-CM | POA: Diagnosis not present

## 2018-02-27 DIAGNOSIS — H539 Unspecified visual disturbance: Secondary | ICD-10-CM | POA: Diagnosis present

## 2018-02-27 DIAGNOSIS — I10 Essential (primary) hypertension: Secondary | ICD-10-CM | POA: Insufficient documentation

## 2018-02-27 LAB — COMPREHENSIVE METABOLIC PANEL
ALT: 12 U/L (ref 0–44)
AST: 22 U/L (ref 15–41)
Albumin: 3.9 g/dL (ref 3.5–5.0)
Alkaline Phosphatase: 57 U/L (ref 38–126)
Anion gap: 12 (ref 5–15)
BUN: 8 mg/dL (ref 8–23)
CHLORIDE: 108 mmol/L (ref 98–111)
CO2: 22 mmol/L (ref 22–32)
CREATININE: 1.05 mg/dL — AB (ref 0.44–1.00)
Calcium: 9.1 mg/dL (ref 8.9–10.3)
GFR calc Af Amer: 56 mL/min — ABNORMAL LOW (ref >60)
GFR calc non Af Amer: 48 mL/min — ABNORMAL LOW (ref >60)
Glucose, Bld: 94 mg/dL (ref 70–99)
POTASSIUM: 3.1 mmol/L — AB (ref 3.5–5.1)
SODIUM: 142 mmol/L (ref 135–145)
Total Bilirubin: 0.6 mg/dL (ref 0.3–1.2)
Total Protein: 7.3 g/dL (ref 6.5–8.1)

## 2018-02-27 LAB — CBC WITH DIFFERENTIAL/PLATELET
Abs Immature Granulocytes: 0 10*3/uL (ref 0.0–0.1)
Basophils Absolute: 0.1 10*3/uL (ref 0.0–0.1)
Basophils Relative: 1 %
EOS ABS: 0.3 10*3/uL (ref 0.0–0.7)
EOS PCT: 8 %
HCT: 37.4 % (ref 36.0–46.0)
HEMOGLOBIN: 12.2 g/dL (ref 12.0–15.0)
IMMATURE GRANULOCYTES: 0 %
LYMPHS PCT: 32 %
Lymphs Abs: 1.4 10*3/uL (ref 0.7–4.0)
MCH: 35.6 pg — ABNORMAL HIGH (ref 26.0–34.0)
MCHC: 32.6 g/dL (ref 30.0–36.0)
MCV: 109 fL — AB (ref 78.0–100.0)
MONOS PCT: 11 %
Monocytes Absolute: 0.5 10*3/uL (ref 0.1–1.0)
NEUTROS PCT: 48 %
Neutro Abs: 2.1 10*3/uL (ref 1.7–7.7)
Platelets: 157 10*3/uL (ref 150–400)
RBC: 3.43 MIL/uL — AB (ref 3.87–5.11)
RDW: 12.4 % (ref 11.5–15.5)
WBC: 4.3 10*3/uL (ref 4.0–10.5)

## 2018-02-27 NOTE — ED Triage Notes (Signed)
Pt endorses seeing floaters and spots starting 2 hours ago. Denies Neuro sx. Pt has hx of htn and BP found to be 214/86. Denies CP, shob, dizziness, ha, numbness, tingling, or weakness.

## 2018-02-27 NOTE — Discharge Instructions (Addendum)
Please call the eye doctor for follow up

## 2018-02-27 NOTE — ED Provider Notes (Signed)
[place] EMERGENCY DEPARTMENT Provider Note   CSN: [id number] Arrival date & time: 02/27/18  1230     History   Chief Complaint Chief Complaint  Patient presents with  . Visual Field Change  . Hypertension    HPI Haley Mcmillan is a 84 y.o. female.  HPI 74-year-old female presents the emergency department complaints of abnormal vision out of her right eye that began around 10:00 this morning.  She states that she initially saw a "blob of red".  She then saw things that look like strings or cracks or cobwebs.  This has since significantly resolved.  She is without symptoms at this time.  No prior history.  No eye pain.  No fevers or chills.  Denies headache.  No recent eye trauma.  Wears corrective glasses.  No pain with extraocular movement.  She noticed this only out of the right eye.  She checked both eyes.    Past Medical History:  Diagnosis Date  . Anemia   . Arthritis   . Colon polyps   . Olecranon bursitis    Elbow  . Other and unspecified hyperlipidemia   . Palpitations   . Unspecified essential hypertension     Patient Active Problem List   Diagnosis Date Noted  . Alopecia 08/12/2017  . Atherosclerosis of aorta ([place]) 05/12/2017  . Iron deficiency anemia due to chronic blood loss 05/12/2016  . Numbness of foot 05/05/2016  . 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 Liver nodule 04/08/2016  . Loss of weight 03/04/2016  . Routine health maintenance 12/25/2012  . Hyperlipidemia 06/21/2007  . Essential hypertension 06/21/2007  . Palpitations 06/21/2007    Past Surgical History:  Procedure Laterality Date  . COLONOSCOPY W/ POLYPECTOMY  01/2004  . CRYOTHERAPY     female-cryosurgery stated by pt  . ESOPHAGOGASTRODUODENOSCOPY N/A 04/09/2016   Procedure: ESOPHAGOGASTRODUODENOSCOPY (EGD);  Surgeon: Doran Stabler, MD;  Location: [facility] ENDOSCOPY;  Service: Endoscopy;  Laterality: N/A;  . POLYPECTOMY     from vocal surgery     OB History   None      Home Medications    Prior to Admission medications   Medication Sig Start Date End Date Taking? Authorizing Provider  Cholecalciferol (VITAMIN D3) 5000 units CAPS Take 5,000 Units by mouth daily.    Yes [provider]  Cyanocobalamin (VITAMIN B 12 PO) Take 1 tablet by mouth daily.   Yes [provider]  imatinib (GLEEVEC) 400 MG tablet Take 1 tablet (400 mg total) by mouth daily. Take with meals and large glass of water.Caution:Chemotherapy. 01/06/18  Yes Truitt Merle, MD  lisinopril (PRINIVIL,ZESTRIL) 20 MG tablet Take 1 tablet (20 mg total) by mouth daily. 03/17/17  Yes Hoyt Koch, MD  Selenium Sulfide 2.25 % SHAM Apply 10 mLs topically 2 (two) times a week. Patient taking differently: Apply 10 mLs topically once a week.  08/13/17  Yes Hoyt Koch, MD  triamcinolone cream (KENALOG) 0.1 % Apply 1 application topically 2 (two) times daily. Patient not taking: Reported on 01/06/2018 09/15/17   Hoyt Koch, MD    Family History Family History  Problem Relation Age of Onset  . Gallstones Mother   . Aneurysm Father        brain  . Heart disease Father        CAD  . Breast cancer Sister   . Breast cancer Sister   . Heart attack Brother   .  Cancer Brother   . Lung cancer Son   . Diabetes Neg Hx     Social History Social History   Tobacco Use  . Smoking status: Former Smoker    Packs/day: 0.50    Years: 50.00    Pack years: 25.00    Types: Cigarettes    Last attempt to quit: 10/31/1994    Years since quitting: 23.3  . Smokeless tobacco: Never Used  . Tobacco comment: SMOKED SOME; [place];   Substance Use Topics  . Alcohol use: Yes    Alcohol/week: 0.0 standard drinks    Comment: wine  . Drug use: No     Allergies   Patient has no known allergies.   Review of Systems Review of Systems  All other systems reviewed and are negative.    Physical Exam Updated Vital  Signs BP (!) 179/91   Pulse 68   Temp 98 F (36.7 C) (Oral)   Resp 15   Ht 5\' 7"  (1.702 m)   Wt 71.7 kg   SpO2 100%   BMI 24.75 kg/m   Physical Exam  Constitutional: She is oriented to person, place, and time. She appears well-developed and well-nourished.  HENT:  Head: Normocephalic.  Eyes: Pupils are equal, round, and reactive to light. Conjunctivae, EOM and lids are normal. Lids are everted and swept, no foreign bodies found. Right eye exhibits no chemosis and no discharge. Right conjunctiva is not injected.  Neck: Normal range of motion.  Pulmonary/Chest: Effort normal.  Abdominal: She exhibits no distension.  Musculoskeletal: Normal range of motion.  Neurological: She is alert and oriented to person, place, and time.  Psychiatric: She has a normal mood and affect.  Nursing note and vitals reviewed.    ED Treatments / Results  Labs (all labs ordered are listed, but only abnormal results are displayed) Labs Reviewed  COMPREHENSIVE METABOLIC PANEL - Abnormal; Notable for the following components:      Result Value   Potassium 3.1 (*)    Creatinine, Ser 1.05 (*)    GFR calc non Af Amer 48 (*)    GFR calc Af Amer 56 (*)    All other components within normal limits  CBC WITH DIFFERENTIAL/PLATELET - Abnormal; Notable for the following components:   RBC 3.43 (*)    MCV 109.0 (*)    MCH 35.6 (*)    All other components within normal limits    EKG None  Radiology No results found.  Procedures [place] bedside Performed by: Jola Schmidt, MD Authorized by: Jola Schmidt, MD     EMERGENCY DEPARTMENT [place] OCULAR EXAM "Study: Limited Ultrasound of Orbit " INDICATIONS: Floaters/Flashes Linear probe utilized to obtain images in both long and short axis of the orbit having the patient look left and right if possible.  PERFORMED BY: Myself IMAGES ARCHIVED?: Yes LIMITATIONS: none VIEWS USED: Right orbit INTERPRETATION: No retinal detachment, Lens in proper  position    Medications Ordered in ED Medications - No data to display   Initial Impression / Assessment and Plan / ED Course  I have reviewed the triage vital signs and the nursing notes.  Pertinent labs & imaging results that were available during my care of the patient were reviewed by me and considered in my medical decision making (see chart for details).     Ocular ultrasound demonstrates no evidence of retinal detachment.  No obvious evidence of vitreous hemorrhage.  Outpatient ophthalmology follow-up.  Final Clinical Impressions(s) / ED Diagnoses   Final  diagnoses:  Visual changes    ED Discharge Orders    None       Jola Schmidt, MD 02/27/18 1644

## 2018-03-08 MED FILL — IMATINIB MESYLATE 400 MG TA: 400 | 30 days supply | Qty: 30 | Fill #1

## 2018-03-09 ENCOUNTER — Telehealth: Payer: Self-pay | Admitting: Hematology

## 2018-03-09 NOTE — Progress Notes (Signed)
[place]  Telephone:([phone] Fax:([phone]) [phone]  Clinic Follow up Note   Patient Care Team: Hoyt Koch, MD as PCP - General (Internal Medicine) Loletha Carrow, Kirke Corin, MD as Consulting Physician (Gastroenterology) Truitt Merle, MD as Consulting Physician (Hematology)   Date of Service:  03/10/2018  CHIEF COMPLAIN: follow up metastatic gastric GIST   SUMMARY OF ONCOLOGIC HISTORY: Oncology History   Malignant gastrointestinal stromal tumor (GIST) of stomach ([place])   Staging form: Gastric Stromal Tumor - Gastric Gist, AJCC 7th Edition   - Clinical stage from 04/09/2016: Stage IV (T3, N0, M1, Mitotic rate: Low) - Signed by Truitt Merle, MD on 04/20/2016      Malignant gastrointestinal stromal tumor (GIST) of stomach ([place])   04/09/2016 Initial Diagnosis    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    04/09/2016 Initial Biopsy    Gastric mass biopsy showed gastrointestinal stromal tumor, I feel mitosis and necrosis, intermediate to high-grade.    04/09/2016 Imaging    CT chest, abdomen and pelvis with contrast showed a large exophytic mass in the proximal lateral stomach measuring 10 x 7.4 cm, nodular lesions within omentum and multiple liver nodules are highly suspicious for metastatic disease.    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 Today's study demonstrates a positive response to therapy with regression of the primary gastric neoplasm, as well as regression of numerous intraperitoneal  metastatic lesions. Additionally, the liver lesions seen on the prior study appear far more well-defined, decreased in size and are much lower attenuation, suggesting internal areas of necrosis. 2. Several small pulmonary nodules are noted, some of which are new compared to the prior examination. These are nonspecific, but metastatic disease to the lungs is not excluded. Attention on follow-up imaging is recommended. 3. Importantly, there is also widespread ground-glass attenuation and septal thickening in the lungs bilaterally which is new compared to the prior study. In the appropriate clinical setting, this could reflect a drug reaction. Other differential considerations include atypical infection, or even developing interstitial lung disease. Clinical correlation is suggested. 4. Aortic atherosclerosis, in addition to right coronary artery disease. 5. Mild diffuse bronchial wall thickening with mild centrilobular and paraseptal emphysema; imaging findings suggestive of underlying COPD. 6. Additional incidental findings, as above.    09/22/2016 Imaging     CT CAP W CONTRAST   IMPRESSION: 1. Nodular appearing GI stromal tumor along the posterior gastric wall with associated peritoneal implants and hepatic metastases, stable from 07/16/2016. 2. Improving mid and lower lung zone predominant peribronchovascular ground-glass, possibly related to drug therapy. 3. A few scattered pulmonary nodules are stable. 4. Aortic atherosclerosis (ICD10-170.0). Coronary artery calcification.    01/14/2017 Imaging    CT CAP w Contrast  IMPRESSION: 1. Further decrease in size of large GI stromal tumor arising from the greater curvature of the stomach with improvement in hepatic and multifocal peritoneal metastases. 2. Further improvement in lower lung predominant peribronchovascular ground-glass density, probably inflammatory (drug reaction or or infectious).  3. No acute findings. 4.  Aortic  Atherosclerosis (ICD10-I70.0).    05/08/2017 Imaging    IMPRESSION: 1. Overall, today's examination is very similar to recent prior studies again demonstrating a large proximal gastric mass with adjacent gastrosplenic ligament lymphadenopathy, multiple treated lesions in the liver, and other intraperitoneal metastases, as discussed above. No definite new sites of metastatic disease are otherwise noted. 2. Aortic atherosclerosis, in addition to right coronary artery disease. 3. Mild cardiomegaly. 4. There are calcifications of the aortic valve. Echocardiographic correlation for evaluation of potential valvular dysfunction may be warranted if clinically indicated. 5. Additional incidental findings, as above. Aortic Atherosclerosis (ICD10-I70.0).    09/07/2017 Imaging    CT CAP W Contrast 09/07/17 IMPRESSION: 1. Large mass extending off the greater curvature of the stomach is similar to prior. Majority of nodularity within the adjacent mesentery is similar. There is one adjacent nodule which has increased in size when compared to prior exam. 2. Grossly unchanged treated hepatic lesions.    01/04/2018 Imaging    CT CAP WO COntrast 01/04/18 IMPRESSION: 1. No substantial interval change in exam. 2. Irregular mass at the proximal stomach is similar to prior. Scattered soft tissue lesions in the perigastric reason, omentum, and mesentery are similar to prior. No generalized trend towards worsening or improving disease. No new or progressive findings on today's study. 3. Stable appearance multiple hepatic lesions. 4.  Aortic Atherosclerois (ICD10-170.0)    History of present illness (04/10/2016):   Haley Abbett Cravenis a 84 y.o.femalewith medical history significantfor anemia, arthritis, colonic polyps, hyperlipidemia, hypertension presents to the emergency department after recent GI visit with worsening abdominal pain. She states the pain is intermittent, started about 2 weeks ago,  located in the epigastric area, no significant nausea or change of her bowel habits. Her appetite and energy level has decreased lately, although she is able to take care of herself. When the pain resolves, she remains to be active and do house cleaning etc. he has lost about 30 pounds in the past 10-12 months, and the most weight with loss in the past few months. A CT scan showed a large exophytic mass in proximal lateral stomach, measuring 10 x 7.4 cm, and nodular lesions in the omentum, and a multiple liver lesions suspicious for metastatic disease. She underwent EGD by Dr. Simona Huh yesterday, which showed a large gastric tumor in the fundus, biopsy was done, pathology results still pending.  CURRENT THERAPY: Gleevec '400mg'$  daily, started on 04/22/2016  INTERVAL HISTORY:  Haley Mcmillan returns for follow-up of metastatic gastric GIST. Unfortunately, she went to the ER on 02/27/2018 due to floaters and vision changes in her right eye. BP was found to be 214/86. There was no vitreous hemorrhage and she was set to f/u with OP ophthalmology.  Today, she is here alone at the clinic. She is doing well, but her BP is still high at 197/69. She has no new complaints. She has one BM a day.    She states that she will have cataract surgery soon and gets headaches when she doesn't eat on time.    REVIEW OF SYSTEMS:   Constitutional: Denies fevers, chills (+) headaches when she doesn't eat Eyes: Denies blurriness of vision (+) recent floaters Ears, nose, mouth, throat, and face: Denies mucositis or sore throat Respiratory: Denies dyspnea or wheezes  Cardiovascular: Denies palpitation, chest discomfort or lower extremity swelling  Gastrointestinal:  Denies nausea, heartburn   Skin: Denies abnormal skin rashes  Lymphatics: Denies new lymphadenopathy or easy  bruising Neurological:Denies numbness, tingling or new weaknesses Behavioral/Psych: Mood is stable, no new changes  All other systems were reviewed with  the patient and are negative.   MEDICAL HISTORY:  Past Medical History:  Diagnosis Date  . Anemia   . Arthritis   . Colon polyps   . Olecranon bursitis    Elbow  . Other and unspecified hyperlipidemia   . Palpitations   . Unspecified essential hypertension     SURGICAL HISTORY: Past Surgical History:  Procedure Laterality Date  . COLONOSCOPY W/ POLYPECTOMY  01/2004  . CRYOTHERAPY     female-cryosurgery stated by pt  . ESOPHAGOGASTRODUODENOSCOPY N/A 04/09/2016   Procedure: ESOPHAGOGASTRODUODENOSCOPY (EGD);  Surgeon: Doran Stabler, MD;  Location[facility] ENDOSCOPY;  Service: Endoscopy;  Laterality: N/A;  . POLYPECTOMY     from vocal surgery    I have reviewed the social history and family history with the patient and they are unchanged from previous note.  She stopped smoking in 1996 and began smoking sometime when she was a teenager.  ALLERGIES:  has No Known Allergies.  MEDICATIONS:  Current Outpatient Medications  Medication Sig Dispense Refill  . Cholecalciferol (VITAMIN D3) 5000 units CAPS Take 5,000 Units by mouth daily.     . Cyanocobalamin (VITAMIN B 12 PO) Take 1 tablet by mouth daily.    Marland Kitchen imatinib (GLEEVEC) 400 MG tablet Take 1 tablet (400 mg total) by mouth daily. Take with meals and large glass of water.Caution:Chemotherapy. 30 tablet 5  . lisinopril (PRINIVIL,ZESTRIL) 20 MG tablet Take 1 tablet (20 mg total) by mouth daily. 90 tablet 3  . Selenium Sulfide 2.25 % SHAM Apply 10 mLs topically 2 (two) times a week. (Patient taking differently: Apply 10 mLs topically once a week. ) 180 mL 6  . amLODipine (NORVASC) 5 MG tablet Take 1 tablet (5 mg total) by mouth daily. 30 tablet 0  . potassium chloride SA (K-DUR,KLOR-CON) 20 MEQ tablet Take 1 tablet (20 mEq total) by mouth 2 (two) times daily. 20 tablet 0   No current facility-administered medications for this visit.     PHYSICAL EXAMINATION:  ECOG PERFORMANCE STATUS: 0  Vitals:   03/10/18 1346 03/10/18 1417    BP: (!) 197/69 (!) 201/80  Pulse:  (!) 57  Resp:    Temp:    SpO2:     Filed Weights   03/10/18 1344  Weight: 161 lb (73 kg)     GENERAL:alert. SKIN: skin color, texture, turgor are normal, no rashes or significant lesions EYES: normal, Conjunctiva are pink and non-injected, sclera clear OROPHARYNX:no exudate, no erythema and lips, buccal mucosa, and tongue normal  NECK: supple, thyroid normal size, non-tender, without nodularity LYMPH:  no palpable lymphadenopathy in the cervical, axillary or inguinal LUNGS: clear to auscultation and percussion with normal breathing effort HEART: regular rate & rhythm and no murmurs and no lower extremity edema ABDOMEN:abdomen soft, non-tender and normal bowel sounds Musculoskeletal:no cyanosis of digits and no clubbing  MSK: (+) ankle itching and discoloration NEURO: alert & oriented x 3 with fluent speech, no focal motor/sensory deficits  LABORATORY DATA:  I have reviewed the data as listed CBC Latest Ref Rng & Units 03/10/2018 02/27/2018 01/04/2018  WBC 3.9 - 10.3 K/uL 4.3 4.3 4.2  Hemoglobin 11.6 - 15.9 g/dL 12.0 12.2 12.6  Hematocrit 34.8 - 46.6 % 35.8 37.4 37.9  Platelets 145 - 400 K/uL 163 157 153     CMP Latest Ref Rng & Units 03/10/2018 02/27/2018 01/04/2018  Glucose 70 - 99 mg/dL 89 94 91  BUN 8 - 23 mg/dL '9 8 10  '$ Creatinine 0.44 - 1.00 mg/dL 1.12(H) 1.05(H) 1.29(H)  Sodium 135 - 145 mmol/L 147(H) 142 143  Potassium 3.5 - 5.1 mmol/L 3.0(LL) 3.1(L) 3.8  Chloride 98 - 111 mmol/L 110 108 108  CO2 22 - 32 mmol/L '28 22 26  '$ Calcium 8.9 - 10.3 mg/dL 9.0 9.1 9.2  Total Protein 6.5 - 8.1 g/dL 7.2 7.3 7.5  Total Bilirubin 0.3 - 1.2 mg/dL 0.5 0.6 0.5  Alkaline Phos 38 - 126 U/L 66 57 69  AST 15 - 41 U/L '17 22 24  '$ ALT 0 - 44 U/L '9 12 11   '$ PATHOLOGY REPORT   Diagnosis 04/09/2016 Stomach, biopsy - GASTROINTESTINAL STROMAL TUMOR (GIST) - SEE COMMENT. Microscopic Comment The biopsy fragments reveal a hypercellular spindle cell  proliferation with cytologic atypia, a few mitoses and necrosis. 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 Immunohistochemical stains were performed revealing that the tumor cells are strongly positive for CD117 and CD34. They are negative for cytokeratin A18, cytokeratin AE1/AE3, desmin, smooth muscle actin, smooth muscle myosin and S100. Dr. Claudette Laws has reviewed the case and concurs with this interpretation. Dr. Annamaria Boots was paged on 04/11/16.   Diagnosis 04/14/2016 Peritoneum, biopsy, Anterior abdomen - GASTROINTESTINAL STROMAL TUMOR (GIST). - SEE COMMENT. Microscopic Comment The tumor is morphologically identical to the patient's previous biopsy ([id number]). Additional studies can be performed upon clinical request. (JBK:gt, 04/15/16)    RADIOGRAPHIC STUDIES: I have personally reviewed the radiological images as listed and agreed with the findings in the report. No results found.   01/05/2018 CT CAP IMPRESSION: 1. No substantial interval change in exam. 2. Irregular mass at the proximal stomach is similar to prior. Scattered soft tissue lesions in the perigastric reason, omentum, and mesentery are similar to prior. No generalized trend towards worsening or improving disease. No new or progressive findings on today's study. 3. Stable appearance multiple hepatic lesions. 4. Aortic Atherosclerois (ICD10-170.0)  EGD 04/09/2016 Dr. Loletha Carrow  The esophagus was normal. Findings: A large, fungating and infiltrative, very deeply-ulcerated and necrotic, malignant-appearing mass with stigmata of recent bleeding was found in the gastric fundus. Biopsies were taken with a cold forceps for histology. The duodenal bulb was normal. - Normal esophagus. - Likely malignant gastric tumor in the gastric fundus. extensively biopsied. - Normal duodenal bulb.   CT CAP W CONTRAST  09/22/16 IMPRESSION: 1. Nodular appearing GI stromal  tumor along the posterior gastric wall with associated peritoneal implants and hepatic metastases, stable from 07/16/2016. 2. Improving mid and lower lung zone predominant peribronchovascular ground-glass, possibly related to drug therapy. 3. A few scattered pulmonary nodules are stable. 4. Aortic atherosclerosis (ICD10-170.0). Coronary artery Calcification.  CT CAP W Contrast 09/07/17 IMPRESSION: 1. Large mass extending off the greater curvature of the stomach is similar to prior. Majority of nodularity within the adjacent mesentery is similar. There is one adjacent nodule which has increased in size when compared to prior exam. 2. Grossly unchanged treated hepatic lesions.  ASSESSMENT & PLAN:  84 y.o. African-American female, with past medical history of hypertension, otherwise healthy and active, presented with progressive weight loss, fatigue, anorexia and intermittent epigastric pain.  1. Malignant gastrointestinal stromal tumor (GIST) of stomach, with liver and peritoneal metastasis -I previously reviewed her CT scan findings, gastric mass and peritoneal mass biopsy results, in details with patient and her family members -We previously discussed this is  not curable disease, but is very treatable, with mediastinal survival 4-5 years -Her KIT mutation showed exon 11 deletion, which predicts better response to TKI,  I previously reviewed the results in detail with her. -She has been tolerating Gleevec very well since starting in 04/2016, no noticeable side effects, except mild anorexia which could be related and occasional cough with phlegm production. She overall has felt better since she started the [place]. -Her restaging scans have showed excellent response to [place] so far. -I again discussed that the goal of care is palliative, and she would likely develop resistance to Gleevec down the road. If her future scan shows progressive disease we will change her Gleevec to another TKI  such as Sutent.  -We personally reviewed her restaging CT scan from 01/04/18 and agree with the radiology interpretation of stable disease.  I reviewed the results with patient.  She is pleased. -Gleevec is controlling her disease and she is tolerating well. She will continue [place] reviewed, CBC showed WBC 4.3 Hg 12.0 MCV 106.3. CMP showed K 3. -I will prescribe potassium pills due to her hypokalemia. I also advised her to eat high K food like bananas.  -F/u in 2 months, plan to repeat staging scan before next visit   2. Anemia, iron deficiency  -Her anemia has overall improved since she started iron pill. Her initial study will consistent with iron deficiency -Continue oral ferous sulfate once daily.  -Currently resolved    3. HTN, uncontrolled  -Her BP has been persistently high lately in my office, but she states her blood pressure has been mostly normal at home, she is asymptomatic.   -Currently on Lisinopril '20mg'$  daily.   -BP today is high at 197/69. She will contact her PCP today   4. Pulmonary nodules and groundglass change -Found on CT scan in 06/2016. Uncertain etiology. Atypical infection, COPD, vs Gleevec induced pneumonitis are on the differential. She does not have a clinical presentation of pneumonia. Likely related to [place]  -repeated CT scan in 09/2016 and 01/2017 showed significant improvement. She remains to be asymptomatic, we'll continue monitoring -stable pulmonary nodules, groundglass changes have resolved on subsequent scan    Plan -Restaging CT scan before next visit   -Lab and f/u in 2 months  -I prescribes potassium pills today, she will take 20 [place] twice daily for 3 days, then once daily for total of 20 tablets. -Continue 400 mg Gleevec daily, refilled today    All questions were answered. The patient knows to call the clinic with any problems, questions or concerns. No barriers to learning was detected.  I spent 20 minutes counseling the patient  face to face. The total time spent in the appointment was 25 minutes and more than 50% was on counseling and review of test results  I, Noor Dweik am acting as scribe for Dr. Truitt Merle.  I have reviewed the above documentation for accuracy and completeness, and I agree with the above.    Truitt Merle  03/10/18

## 2018-03-09 NOTE — Telephone Encounter (Signed)
Scheduled appt per 9/23 sch message - pt is aware of appt date and time

## 2018-03-10 ENCOUNTER — Inpatient Hospital Stay: Payer: Medicare Other

## 2018-03-10 ENCOUNTER — Other Ambulatory Visit: Payer: Self-pay | Admitting: Internal Medicine

## 2018-03-10 ENCOUNTER — Encounter: Payer: Self-pay | Admitting: Hematology

## 2018-03-10 ENCOUNTER — Inpatient Hospital Stay: Payer: Medicare Other | Attending: Nurse Practitioner

## 2018-03-10 ENCOUNTER — Telehealth: Payer: Self-pay

## 2018-03-10 ENCOUNTER — Inpatient Hospital Stay: Payer: Medicare Other | Admitting: Hematology

## 2018-03-10 ENCOUNTER — Other Ambulatory Visit: Payer: Self-pay | Admitting: Hematology

## 2018-03-10 ENCOUNTER — Inpatient Hospital Stay (HOSPITAL_BASED_OUTPATIENT_CLINIC_OR_DEPARTMENT_OTHER): Payer: Medicare Other | Admitting: Hematology

## 2018-03-10 VITALS — BP 201/80 | HR 57 | Temp 98.1°F | Resp 22 | Ht 67.0 in | Wt 161.0 lb

## 2018-03-10 DIAGNOSIS — I700 Atherosclerosis of aorta: Secondary | ICD-10-CM | POA: Insufficient documentation

## 2018-03-10 DIAGNOSIS — C786 Secondary malignant neoplasm of retroperitoneum and peritoneum: Secondary | ICD-10-CM | POA: Diagnosis not present

## 2018-03-10 DIAGNOSIS — I10 Essential (primary) hypertension: Secondary | ICD-10-CM | POA: Insufficient documentation

## 2018-03-10 DIAGNOSIS — R918 Other nonspecific abnormal finding of lung field: Secondary | ICD-10-CM | POA: Insufficient documentation

## 2018-03-10 DIAGNOSIS — E876 Hypokalemia: Secondary | ICD-10-CM

## 2018-03-10 DIAGNOSIS — C49A2 Gastrointestinal stromal tumor of stomach: Secondary | ICD-10-CM

## 2018-03-10 DIAGNOSIS — C787 Secondary malignant neoplasm of liver and intrahepatic bile duct: Secondary | ICD-10-CM | POA: Diagnosis not present

## 2018-03-10 DIAGNOSIS — D509 Iron deficiency anemia, unspecified: Secondary | ICD-10-CM | POA: Diagnosis not present

## 2018-03-10 DIAGNOSIS — Z79899 Other long term (current) drug therapy: Secondary | ICD-10-CM

## 2018-03-10 LAB — CBC WITH DIFFERENTIAL/PLATELET
BASOS PCT: 1 %
Basophils Absolute: 0 10*3/uL (ref 0.0–0.1)
Eosinophils Absolute: 0.4 10*3/uL (ref 0.0–0.5)
Eosinophils Relative: 10 %
HCT: 35.8 % (ref 34.8–46.6)
HEMOGLOBIN: 12 g/dL (ref 11.6–15.9)
Lymphocytes Relative: 28 %
Lymphs Abs: 1.2 10*3/uL (ref 0.9–3.3)
MCH: 35.6 pg — ABNORMAL HIGH (ref 25.1–34.0)
MCHC: 33.5 g/dL (ref 31.5–36.0)
MCV: 106.3 fL — ABNORMAL HIGH (ref 79.5–101.0)
Monocytes Absolute: 0.4 10*3/uL (ref 0.1–0.9)
Monocytes Relative: 10 %
NEUTROS PCT: 51 %
Neutro Abs: 2.3 10*3/uL (ref 1.5–6.5)
Platelets: 163 10*3/uL (ref 145–400)
RBC: 3.36 MIL/uL — AB (ref 3.70–5.45)
RDW: 12.9 % (ref 11.2–14.5)
WBC: 4.3 10*3/uL (ref 3.9–10.3)

## 2018-03-10 LAB — COMPREHENSIVE METABOLIC PANEL
ALBUMIN: 3.9 g/dL (ref 3.5–5.0)
ALK PHOS: 66 U/L (ref 38–126)
ALT: 9 U/L (ref 0–44)
AST: 17 U/L (ref 15–41)
Anion gap: 9 (ref 5–15)
BUN: 9 mg/dL (ref 8–23)
CALCIUM: 9 mg/dL (ref 8.9–10.3)
CO2: 28 mmol/L (ref 22–32)
Chloride: 110 mmol/L (ref 98–111)
Creatinine, Ser: 1.12 mg/dL — ABNORMAL HIGH (ref 0.44–1.00)
GFR calc Af Amer: 52 mL/min — ABNORMAL LOW (ref >60)
GFR calc non Af Amer: 44 mL/min — ABNORMAL LOW (ref >60)
GLUCOSE: 89 mg/dL (ref 70–99)
Potassium: 3 mmol/L — CL (ref 3.5–5.1)
SODIUM: 147 mmol/L — AB (ref 135–145)
Total Bilirubin: 0.5 mg/dL (ref 0.3–1.2)
Total Protein: 7.2 g/dL (ref 6.5–8.1)

## 2018-03-10 MED ORDER — AMLODIPINE BESYLATE 5 MG PO TABS: 5.0000 mg | ORAL_TABLET | Freq: Every day | ORAL | 0 refills | Status: DC

## 2018-03-10 MED ORDER — POTASSIUM CHLORIDE CRYS ER 20 MEQ PO TBCR: 20.0000 meq | EXTENDED_RELEASE_TABLET | Freq: Two times a day (BID) | ORAL | 0 refills | Status: DC

## 2018-03-10 NOTE — Progress Notes (Signed)
Patient to follow up with Dr. Sharlet Salina her PCP this afternoon regarding her elevated BP, wrote down readings for the patient to take with her.

## 2018-03-10 NOTE — Telephone Encounter (Signed)
Printed avs and calender of upcoming appointment. Per 9/25 los 

## 2018-03-11 ENCOUNTER — Ambulatory Visit (INDEPENDENT_AMBULATORY_CARE_PROVIDER_SITE_OTHER): Payer: Medicare Other | Admitting: Family

## 2018-03-11 ENCOUNTER — Encounter: Payer: Self-pay | Admitting: Family

## 2018-03-11 VITALS — BP 160/78 | HR 59 | Temp 98.0°F | Ht 67.0 in | Wt 161.1 lb

## 2018-03-11 DIAGNOSIS — I10 Essential (primary) hypertension: Secondary | ICD-10-CM

## 2018-03-11 NOTE — Progress Notes (Signed)
Haley Mcmillan is a 84 y.o. female with the following history as recorded in EpicCare:  Patient Active Problem List   Diagnosis Date Noted  . Alopecia 08/12/2017  . Atherosclerosis of aorta ([place]) 05/12/2017  . Iron deficiency anemia due to chronic blood loss 05/12/2016  . Numbness of foot 05/05/2016  . Malignant gastrointestinal stromal tumor (GIST) of stomach ([place]) 04/20/2016  . Liver nodule 04/08/2016  . Loss of weight 03/04/2016  . Routine health maintenance 12/25/2012  . Hyperlipidemia 06/21/2007  . Essential hypertension 06/21/2007  . Palpitations 06/21/2007    Current Outpatient Medications  Medication Sig Dispense Refill  . amLODipine (NORVASC) 5 MG tablet Take 1 tablet (5 mg total) by mouth daily. 30 tablet 0  . Cholecalciferol (VITAMIN D3) 5000 units CAPS Take 5,000 Units by mouth daily.     . Cyanocobalamin (VITAMIN B 12 PO) Take 1 tablet by mouth daily.    Marland Kitchen imatinib (GLEEVEC) 400 MG tablet Take 1 tablet (400 mg total) by mouth daily. Take with meals and large glass of water.Caution:Chemotherapy. 30 tablet 5  . lisinopril (PRINIVIL,ZESTRIL) 20 MG tablet Take 1 tablet (20 mg total) by mouth daily. 90 tablet 3  . potassium chloride SA (K-DUR,KLOR-CON) 20 MEQ tablet Take 1 tablet (20 mEq total) by mouth 2 (two) times daily. 20 tablet 0  . Selenium Sulfide 2.25 % SHAM Apply 10 mLs topically 2 (two) times a week. (Patient taking differently: Apply 10 mLs topically once a week. ) 180 mL 6   No current facility-administered medications for this visit.     Allergies: Patient has no known allergies.  Past Medical History:  Diagnosis Date  . Anemia   . Arthritis   . Colon polyps   . Olecranon bursitis    Elbow  . Other and unspecified hyperlipidemia   . Palpitations   . Unspecified essential hypertension     Past Surgical History:  Procedure Laterality Date  . COLONOSCOPY W/ POLYPECTOMY  01/2004  . CRYOTHERAPY     female-cryosurgery stated by pt  .  ESOPHAGOGASTRODUODENOSCOPY N/A 04/09/2016   Procedure: ESOPHAGOGASTRODUODENOSCOPY (EGD);  Surgeon: Doran Stabler, MD;  Location: [facility] ENDOSCOPY;  Service: Endoscopy;  Laterality: N/A;  . POLYPECTOMY     from vocal surgery    Family History  Problem Relation Age of Onset  . Gallstones Mother   . Aneurysm Father        brain  . Heart disease Father        CAD  . Breast cancer Sister   . Breast cancer Sister   . Heart attack Brother   . Cancer Brother   . Lung cancer Son   . Diabetes Neg Hx     Social History   Tobacco Use  . Smoking status: Former Smoker    Packs/day: 0.50    Years: 50.00    Pack years: 25.00    Types: Cigarettes    Last attempt to quit: 10/31/1994    Years since quitting: 23.3  . Smokeless tobacco: Never Used  . Tobacco comment: SMOKED SOME; [place];   Substance Use Topics  . Alcohol use: Yes    Alcohol/week: 0.0 standard drinks    Comment: wine    Subjective:  Patient has history of hypertension; sent by her oncologist yesterday with concerns for extremely elevated blood pressure; denies any chest pain or shortness of breath; was started on Amlodipine yesterday in addition to her Lisinopril; Was also found to have very low potassium reading by  her oncologist yesterday.   Objective:  Vitals:   03/11/18 1348  BP: (!) 160/78  Pulse: (!) 59  Temp: 98 F (36.7 C)  TempSrc: Oral  SpO2: 99%  Weight: 161 lb 1.9 oz (73.1 kg)  Height: 5\' 7"  (1.702 m)    General: Well developed, well nourished, in no acute distress  Skin : Warm and dry.  Head: Normocephalic and atraumatic  Lungs: Respirations unlabored; clear to auscultation bilaterally without wheeze, rales, rhonchi  CVS exam: normal rate and regular rhythm.  Neurologic: Alert and oriented; speech intact; face symmetrical; moves all extremities well; CNII-XII intact without focal deficit   Assessment:  1. Hypertension, unspecified type     Plan:  Update EKG- no acute changes; appears to be  responding well to new medication regimen; continue both Amlodipine and Lisinopril; follow-up in 7-10 days for re-check/ re-check potassium.   Return for 03/19/2018 with Dr. Sharlet Salina.  Orders Placed This Encounter  Procedures  . EKG 12-Lead    Requested Prescriptions    No prescriptions requested or ordered in this encounter

## 2018-03-19 ENCOUNTER — Ambulatory Visit (INDEPENDENT_AMBULATORY_CARE_PROVIDER_SITE_OTHER): Payer: Medicare Other | Admitting: Internal Medicine

## 2018-03-19 ENCOUNTER — Encounter: Payer: Self-pay | Admitting: Internal Medicine

## 2018-03-19 ENCOUNTER — Other Ambulatory Visit (INDEPENDENT_AMBULATORY_CARE_PROVIDER_SITE_OTHER): Payer: Medicare Other

## 2018-03-19 VITALS — BP 132/68 | HR 60 | Temp 98.5°F | Ht 67.0 in | Wt 158.0 lb

## 2018-03-19 DIAGNOSIS — I10 Essential (primary) hypertension: Secondary | ICD-10-CM

## 2018-03-19 DIAGNOSIS — E876 Hypokalemia: Secondary | ICD-10-CM

## 2018-03-19 LAB — COMPREHENSIVE METABOLIC PANEL
ALT: 9 U/L (ref 0–35)
AST: 19 U/L (ref 0–37)
Albumin: 4.3 g/dL (ref 3.5–5.2)
Alkaline Phosphatase: 62 U/L (ref 39–117)
BUN: 16 mg/dL (ref 6–23)
CO2: 28 mEq/L (ref 19–32)
Calcium: 9.6 mg/dL (ref 8.4–10.5)
Chloride: 108 mEq/L (ref 96–112)
Creatinine, Ser: 1.47 mg/dL — ABNORMAL HIGH (ref 0.40–1.20)
GFR: 43.71 mL/min — ABNORMAL LOW (ref >60.00)
Glucose, Bld: 87 mg/dL (ref 70–99)
Potassium: 4 mEq/L (ref 3.5–5.1)
Sodium: 142 mEq/L (ref 135–145)
Total Bilirubin: 0.4 mg/dL (ref 0.2–1.2)
Total Protein: 7.7 g/dL (ref 6.0–8.3)

## 2018-03-19 NOTE — Patient Instructions (Signed)
We will check the labs today.  When you run out of amlodipine just stop taking it.

## 2018-03-19 NOTE — Assessment & Plan Note (Signed)
BP at goal on lisinopril and amlodipine 5 mg daily. Once her stress is passes will resume lisinopril 20 mg daily only. There is still room to increase this if needed. Checking CMP and adjust as needed.

## 2018-03-19 NOTE — Progress Notes (Signed)
   Subjective:    Patient ID: Haley Mcmillan, female    DOB: [birth year], 84 y.o.   MRN: [id number]  HPI The patient is an 84 YO female coming in for follow up of blood pressure and low potassium (having poor appetite for several days prior to labs, not taking any diuretics, BP was elevated and amlodipine was added). She previously has had normal BP. Denies headaches or chest pains during this time. Overall she is not having any problems with her medication or the new medication.   Review of Systems  Constitutional: Negative.   HENT: Negative.   Eyes: Negative.   Respiratory: Negative for cough, chest tightness and shortness of breath.   Cardiovascular: Negative for chest pain, palpitations and leg swelling.  Gastrointestinal: Negative for abdominal distention, abdominal pain, constipation, diarrhea, nausea and vomiting.  Musculoskeletal: Negative.   Skin: Negative.   Neurological: Negative.   Psychiatric/Behavioral: Negative.       Objective:   Physical Exam  Constitutional: She is oriented to person, place, and time. She appears well-developed and well-nourished.  HENT:  Head: Normocephalic and atraumatic.  Eyes: EOM are normal.  Neck: Normal range of motion.  Cardiovascular: Normal rate and regular rhythm.  Pulmonary/Chest: Effort normal and breath sounds normal. No respiratory distress. She has no wheezes. She has no rales.  Abdominal: Soft. Bowel sounds are normal. She exhibits no distension. There is no tenderness. There is no rebound.  Musculoskeletal: She exhibits no edema.  Neurological: She is alert and oriented to person, place, and time. Coordination normal.  Skin: Skin is warm and dry.   Vitals:   03/19/18 1526  BP: 132/68  Pulse: 60  Temp: 98.5 F (36.9 C)  TempSrc: Oral  Weight: 158 lb (71.7 kg)  Height: 5\' 7"  (1.702 m)      Assessment & Plan:

## 2018-04-05 MED FILL — IMATINIB MESYLATE 400 MG TA: 400 | 30 days supply | Qty: 30 | Fill #2

## 2018-04-08 ENCOUNTER — Other Ambulatory Visit: Payer: Self-pay | Admitting: Internal Medicine

## 2018-04-08 ENCOUNTER — Telehealth: Payer: Self-pay | Admitting: Internal Medicine

## 2018-04-08 MED ORDER — AMLODIPINE BESYLATE 5 MG PO TABS: 5.0000 mg | ORAL_TABLET | Freq: Every day | ORAL | 0 refills | Status: DC

## 2018-04-08 NOTE — Telephone Encounter (Signed)
Copied from [place] [id number]. Topic: General - Other >> Apr 08, 2018 10:35 AM Carolyn Stare wrote:  Pt call to say she saw Dr Burn in Sep 2019 for her BP being high and she was given  amlODipine (NORVASC) 5 MG tablet. She called today to say she is out and is asking if she needs to continue to take the amlodipine and if so she will need a refill . ll she was taking it along with her  lisinopril (PRINIVIL,ZESTRIL) 20 MG tablet   Phamracy  [facility] [address] at [employer]

## 2018-04-08 NOTE — Telephone Encounter (Signed)
Send in refill of amlodipine.

## 2018-04-08 NOTE — Telephone Encounter (Signed)
Refill sent.

## 2018-04-27 ENCOUNTER — Other Ambulatory Visit: Payer: Self-pay | Admitting: Internal Medicine

## 2018-04-27 MED ORDER — LISINOPRIL 20 MG PO TABS: 20.0000 mg | ORAL_TABLET | Freq: Every day | ORAL | 1 refills | Status: DC

## 2018-04-27 NOTE — Telephone Encounter (Signed)
Copied from [place] [id number]. Topic: Quick Communication - Rx Refill/Question >> Apr 27, 2018 11:44 AM Bea Graff, NT wrote: Medication: lisinopril (PRINIVIL,ZESTRIL) 20 MG tablet   Pt would like to speak with a nurse regarding the refill.  Has the patient contacted their pharmacy? Yes.   (Agent: If no, request that the patient contact the pharmacy for the refill.) (Agent: If yes, when and what did the pharmacy advise?)  Preferred Pharmacy (with phone number or street name): [facility], [place] - [place] [phone] (Phone) [phone] (Fax)    Agent: Please be advised that RX refills may take up to 3 business days. We ask that you follow-up with your pharmacy.

## 2018-04-27 NOTE — Telephone Encounter (Signed)
Labs have been reviewed by provider and addressed- Rx refilled. Requested Prescriptions  Pending Prescriptions Disp Refills  . lisinopril (PRINIVIL,ZESTRIL) 20 MG tablet 90 tablet 3    Sig: Take 1 tablet (20 mg total) by mouth daily.     Cardiovascular:  ACE Inhibitors Failed - 04/27/2018 11:50 AM      Failed - Cr in normal range and within 180 days    Creatinine  Date Value Ref Range Status  05/08/2017 1.2 (H) 0.6 - 1.1 mg/dL Final   Creatinine, Ser  Date Value Ref Range Status  03/19/2018 1.47 (H) 0.40 - 1.20 mg/dL Final         Passed - K in normal range and within 180 days    Potassium  Date Value Ref Range Status  03/19/2018 4.0 3.5 - 5.1 mEq/L Final  05/08/2017 4.0 3.5 - 5.1 mEq/L Final         Passed - Patient is not pregnant      Passed - Last BP in normal range    BP Readings from Last 1 Encounters:  03/19/18 132/68         Passed - Valid encounter within last 6 months    Recent Outpatient Visits          1 month ago Essential hypertension   [place], Elizabeth A, MD   1 month ago Hypertension, unspecified type   [place], Marvis Repress, FNP   3 months ago Essential hypertension   [place], Elizabeth A, MD   7 months ago [facility] -Chuck Hint, MD   8 months ago Essential hypertension   [place], Elizabeth A, MD

## 2018-04-27 NOTE — Telephone Encounter (Signed)
Call to patient - she is up to date with visits- she should be fine for her refill- sent to refill pool

## 2018-05-05 ENCOUNTER — Encounter (HOSPITAL_COMMUNITY): Payer: Self-pay

## 2018-05-05 ENCOUNTER — Other Ambulatory Visit: Payer: Self-pay | Admitting: Hematology

## 2018-05-05 ENCOUNTER — Ambulatory Visit (HOSPITAL_COMMUNITY)
Admission: RE | Admit: 2018-05-05 | Discharge: 2018-05-05 | Disposition: A | Discharge status: Home / Self Care | Payer: Medicare Other | Source: Ambulatory Visit | Attending: Hematology | Admitting: Hematology

## 2018-05-05 ENCOUNTER — Inpatient Hospital Stay: Payer: Medicare Other | Attending: Nurse Practitioner

## 2018-05-05 DIAGNOSIS — C7989 Secondary malignant neoplasm of other specified sites: Secondary | ICD-10-CM | POA: Insufficient documentation

## 2018-05-05 DIAGNOSIS — C787 Secondary malignant neoplasm of liver and intrahepatic bile duct: Secondary | ICD-10-CM | POA: Insufficient documentation

## 2018-05-05 DIAGNOSIS — I700 Atherosclerosis of aorta: Secondary | ICD-10-CM | POA: Diagnosis not present

## 2018-05-05 DIAGNOSIS — I7789 Other specified disorders of arteries and arterioles: Secondary | ICD-10-CM | POA: Diagnosis not present

## 2018-05-05 DIAGNOSIS — R918 Other nonspecific abnormal finding of lung field: Secondary | ICD-10-CM | POA: Insufficient documentation

## 2018-05-05 DIAGNOSIS — D509 Iron deficiency anemia, unspecified: Secondary | ICD-10-CM | POA: Insufficient documentation

## 2018-05-05 DIAGNOSIS — Z79899 Other long term (current) drug therapy: Secondary | ICD-10-CM | POA: Diagnosis not present

## 2018-05-05 DIAGNOSIS — C49A2 Gastrointestinal stromal tumor of stomach: Secondary | ICD-10-CM | POA: Insufficient documentation

## 2018-05-05 DIAGNOSIS — I289 Disease of pulmonary vessels, unspecified: Secondary | ICD-10-CM | POA: Diagnosis not present

## 2018-05-05 DIAGNOSIS — I10 Essential (primary) hypertension: Secondary | ICD-10-CM | POA: Insufficient documentation

## 2018-05-05 HISTORY — DX: Malignant (primary) neoplasm, unspecified: C80.1

## 2018-05-05 LAB — CBC WITH DIFFERENTIAL/PLATELET
ABS IMMATURE GRANULOCYTES: 0.01 10*3/uL (ref 0.00–0.07)
BASOS PCT: 1 %
Basophils Absolute: 0 10*3/uL (ref 0.0–0.1)
Eosinophils Absolute: 0.2 10*3/uL (ref 0.0–0.5)
Eosinophils Relative: 4 %
HCT: 36.8 % (ref 36.0–46.0)
Hemoglobin: 12.3 g/dL (ref 12.0–15.0)
IMMATURE GRANULOCYTES: 0 %
Lymphocytes Relative: 33 %
Lymphs Abs: 1.7 10*3/uL (ref 0.7–4.0)
MCH: 35.9 pg — AB (ref 26.0–34.0)
MCHC: 33.4 g/dL (ref 30.0–36.0)
MCV: 107.3 fL — ABNORMAL HIGH (ref 80.0–100.0)
Monocytes Absolute: 0.4 10*3/uL (ref 0.1–1.0)
Monocytes Relative: 9 %
NEUTROS PCT: 53 %
NRBC: 0 % (ref 0.0–0.2)
Neutro Abs: 2.7 10*3/uL (ref 1.7–7.7)
PLATELETS: 156 10*3/uL (ref 150–400)
RBC: 3.43 MIL/uL — AB (ref 3.87–5.11)
RDW: 12.6 % (ref 11.5–15.5)
WBC: 5 10*3/uL (ref 4.0–10.5)

## 2018-05-05 LAB — COMPREHENSIVE METABOLIC PANEL
ALT: 9 U/L (ref 0–44)
AST: 21 U/L (ref 15–41)
Albumin: 4 g/dL (ref 3.5–5.0)
Alkaline Phosphatase: 65 U/L (ref 38–126)
Anion gap: 10 (ref 5–15)
BUN: 10 mg/dL (ref 8–23)
CHLORIDE: 110 mmol/L (ref 98–111)
CO2: 25 mmol/L (ref 22–32)
CREATININE: 1.37 mg/dL — AB (ref 0.44–1.00)
Calcium: 9.2 mg/dL (ref 8.9–10.3)
GFR calc Af Amer: 40 mL/min — ABNORMAL LOW (ref >60)
GFR calc non Af Amer: 35 mL/min — ABNORMAL LOW (ref >60)
Glucose, Bld: 92 mg/dL (ref 70–99)
Potassium: 4 mmol/L (ref 3.5–5.1)
SODIUM: 145 mmol/L (ref 135–145)
Total Bilirubin: 0.4 mg/dL (ref 0.3–1.2)
Total Protein: 7.4 g/dL (ref 6.5–8.1)

## 2018-05-05 MED FILL — IMATINIB MESYLATE 400 MG TA: 400 | 30 days supply | Qty: 30 | Fill #3

## 2018-05-07 ENCOUNTER — Telehealth: Payer: Self-pay | Admitting: Hematology

## 2018-05-07 ENCOUNTER — Inpatient Hospital Stay (HOSPITAL_BASED_OUTPATIENT_CLINIC_OR_DEPARTMENT_OTHER): Payer: Medicare Other | Admitting: Hematology

## 2018-05-07 ENCOUNTER — Encounter: Payer: Self-pay | Admitting: Hematology

## 2018-05-07 VITALS — BP 161/71 | HR 62 | Temp 98.0°F | Resp 18 | Ht 67.0 in | Wt 162.6 lb

## 2018-05-07 DIAGNOSIS — C7989 Secondary malignant neoplasm of other specified sites: Secondary | ICD-10-CM

## 2018-05-07 DIAGNOSIS — R918 Other nonspecific abnormal finding of lung field: Secondary | ICD-10-CM | POA: Diagnosis not present

## 2018-05-07 DIAGNOSIS — D509 Iron deficiency anemia, unspecified: Secondary | ICD-10-CM

## 2018-05-07 DIAGNOSIS — I10 Essential (primary) hypertension: Secondary | ICD-10-CM

## 2018-05-07 DIAGNOSIS — C49A2 Gastrointestinal stromal tumor of stomach: Secondary | ICD-10-CM

## 2018-05-07 DIAGNOSIS — Z79899 Other long term (current) drug therapy: Secondary | ICD-10-CM

## 2018-05-07 DIAGNOSIS — I700 Atherosclerosis of aorta: Secondary | ICD-10-CM

## 2018-05-07 MED ORDER — IMATINIB MESYLATE 400 MG PO TABS: 400.0000 mg | ORAL_TABLET | Freq: Every day | ORAL | 5 refills | Status: DC

## 2018-05-07 NOTE — Telephone Encounter (Signed)
Gave pt avs and calendar  °

## 2018-05-07 NOTE — Progress Notes (Signed)
[place]   [phone] [phone]   Clinic Follow up Note   Patient Care Team: Hoyt Koch, MD as PCP - General (Internal Medicine) Loletha Carrow, Kirke Corin, MD as Consulting Physician (Gastroenterology) Truitt Merle, MD as Consulting Physician (Hematology)  Date of Service:  05/07/2018  CHIEF COMPLAINT: F/u for GIST  SUMMARY OF ONCOLOGIC HISTORY: Oncology History   Malignant gastrointestinal stromal tumor (GIST) of stomach ([place])   Staging form: Gastric Stromal Tumor - Gastric Gist, AJCC 7th Edition   - Clinical stage from 04/09/2016: Stage IV (T3, N0, M1, Mitotic rate: Low) - Signed by Truitt Merle, MD on 04/20/2016      Malignant gastrointestinal stromal tumor (GIST) of stomach ([place])   04/09/2016 Initial Diagnosis    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    04/09/2016 Initial Biopsy    Gastric mass biopsy showed gastrointestinal stromal tumor, I feel mitosis and necrosis, intermediate to high-grade.    04/09/2016 Imaging    CT chest, abdomen and pelvis with contrast showed a large exophytic mass in the proximal lateral stomach measuring 10 x 7.4 cm, nodular lesions within omentum and multiple liver nodules are highly suspicious for metastatic disease.    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 Today's study demonstrates a positive response to therapy with regression of the primary gastric neoplasm, as well as regression of numerous intraperitoneal metastatic lesions.  Additionally, the liver lesions seen on the prior study appear far more well-defined, decreased in size and are much lower attenuation, suggesting internal areas of necrosis. 2. Several small pulmonary nodules are noted, some of which are new compared to the prior examination. These are nonspecific, but metastatic disease to the lungs is not excluded. Attention on follow-up imaging is recommended. 3. Importantly, there is also widespread ground-glass attenuation and septal thickening in the lungs bilaterally which is new compared to the prior study. In the appropriate clinical setting, this could reflect a drug reaction. Other differential considerations include atypical infection, or even developing interstitial lung disease. Clinical correlation is suggested. 4. Aortic atherosclerosis, in addition to right coronary artery disease. 5. Mild diffuse bronchial wall thickening with mild centrilobular and paraseptal emphysema; imaging findings suggestive of underlying COPD. 6. Additional incidental findings, as above.    09/22/2016 Imaging     CT CAP W CONTRAST   IMPRESSION: 1. Nodular appearing GI stromal tumor along the posterior gastric wall with associated peritoneal implants and hepatic metastases, stable from 07/16/2016. 2. Improving mid and lower lung zone predominant peribronchovascular ground-glass, possibly related to drug therapy. 3. A few scattered pulmonary nodules are stable. 4. Aortic atherosclerosis (ICD10-170.0). Coronary artery calcification.    01/14/2017 Imaging    CT CAP w Contrast  IMPRESSION: 1. Further decrease in size of large GI stromal tumor arising from the greater curvature of the stomach with improvement in hepatic and multifocal peritoneal metastases. 2. Further improvement in lower lung predominant peribronchovascular ground-glass density, probably inflammatory (drug reaction or or infectious). 3. No  acute findings. 4.  Aortic Atherosclerosis  (ICD10-I70.0).    05/08/2017 Imaging    IMPRESSION: 1. Overall, today's examination is very similar to recent prior studies again demonstrating a large proximal gastric mass with adjacent gastrosplenic ligament lymphadenopathy, multiple treated lesions in the liver, and other intraperitoneal metastases, as discussed above. No definite new sites of metastatic disease are otherwise noted. 2. Aortic atherosclerosis, in addition to right coronary artery disease. 3. Mild cardiomegaly. 4. There are calcifications of the aortic valve. Echocardiographic correlation for evaluation of potential valvular dysfunction may be warranted if clinically indicated. 5. Additional incidental findings, as above. Aortic Atherosclerosis (ICD10-I70.0).    09/07/2017 Imaging    CT CAP W Contrast 09/07/17 IMPRESSION: 1. Large mass extending off the greater curvature of the stomach is similar to prior. Majority of nodularity within the adjacent mesentery is similar. There is one adjacent nodule which has increased in size when compared to prior exam. 2. Grossly unchanged treated hepatic lesions.    01/04/2018 Imaging    CT CAP WO COntrast 01/04/18 IMPRESSION: 1. No substantial interval change in exam. 2. Irregular mass at the proximal stomach is similar to prior. Scattered soft tissue lesions in the perigastric reason, omentum, and mesentery are similar to prior. No generalized trend towards worsening or improving disease. No new or progressive findings on today's study. 3. Stable appearance multiple hepatic lesions. 4.  Aortic Atherosclerois (ICD10-170.0)    05/05/2018 Imaging    CT CAP 05/05/18  IMPRESSION: 1. Mild response to therapy, as evidenced by decreased size of many soft tissue lesions along the greater curvature of the stomach, as well as within the omentum. 2. Similar hepatic metastasis. 3.  No acute process or evidence of metastatic disease in the chest. 4.  Aortic Atherosclerosis  (ICD10-I70.0). 5. Pulmonary artery enlargement suggests pulmonary arterial hypertension.      CURRENT THERAPY:  Gleevec 491m daily starting 04/22/16  INTERVAL HISTORY:  [name] here for a follow up of her GIST. She presents to the clinic today by herself. She notes she is doing well. She denies nausea, pain or abdominal bloating. She notes having loose stool once in the morning. This is exacerbated with CT Contrast. She notes she is doing all activities she would like to do. She does note she is no longer taking oral potassium as she ran out, but levels are adequate to not need a refill. She notes she is not consistently drinking enough water. She wonders if she is able to have some wine for the holidays.    REVIEW OF SYSTEMS:   Constitutional: Denies fevers, chills or abnormal weight loss Eyes: Denies blurriness of vision Ears, nose, mouth, throat, and face: Denies mucositis or sore throat Respiratory: Denies cough, dyspnea or wheezes Cardiovascular: Denies palpitation, chest discomfort or lower extremity swelling (+) Loose stool once daily  Gastrointestinal:  Denies nausea, heartburn or change in bowel habits Skin: Denies abnormal skin rashes Lymphatics: Denies new lymphadenopathy or easy bruising Neurological:Denies numbness, tingling or new weaknesses Behavioral/Psych: Mood is stable, no new changes  All other systems were reviewed with the patient and are negative.  MEDICAL HISTORY:  Past Medical History:  Diagnosis Date  . Anemia   . Arthritis   . Colon polyps   . GIST DX'D  03/2016  . Olecranon bursitis    Elbow  . Other and unspecified hyperlipidemia   . Palpitations   . Unspecified essential hypertension     SURGICAL HISTORY: Past Surgical History:  Procedure Laterality Date  .  COLONOSCOPY W/ POLYPECTOMY  01/2004  . CRYOTHERAPY     female-cryosurgery stated by pt  . ESOPHAGOGASTRODUODENOSCOPY N/A 04/09/2016   Procedure: ESOPHAGOGASTRODUODENOSCOPY  (EGD);  Surgeon: Doran Stabler, MD;  Location: [facility] ENDOSCOPY;  Service: Endoscopy;  Laterality: N/A;  . POLYPECTOMY     from vocal surgery    I have reviewed the social history and family history with the patient and they are unchanged from previous note.  ALLERGIES:  has No Known Allergies.  MEDICATIONS:  Current Outpatient Medications  Medication Sig Dispense Refill  . amLODipine (NORVASC) 5 MG tablet TAKE 1 TABLET(5 MG) BY MOUTH DAILY 90 tablet 1  . Cholecalciferol (VITAMIN D3) 5000 units CAPS Take 5,000 Units by mouth daily.     . Cyanocobalamin (VITAMIN B 12 PO) Take 1 tablet by mouth daily.    Marland Kitchen imatinib (GLEEVEC) 400 MG tablet Take 1 tablet (400 mg total) by mouth daily. Take with meals and large glass of water.Caution:Chemotherapy. 30 tablet 5  . lisinopril (PRINIVIL,ZESTRIL) 20 MG tablet Take 1 tablet (20 mg total) by mouth daily. 90 tablet 1  . potassium chloride SA (K-DUR,KLOR-CON) 20 MEQ tablet Take 1 tablet (20 mEq total) by mouth 2 (two) times daily. 20 tablet 0  . Selenium Sulfide 2.25 % SHAM Apply 10 mLs topically 2 (two) times a week. (Patient taking differently: Apply 10 mLs topically once a week. ) 180 mL 6   No current facility-administered medications for this visit.     PHYSICAL EXAMINATION: ECOG PERFORMANCE STATUS: 1 - Symptomatic but completely ambulatory  Vitals:   05/07/18 1316  BP: (!) 161/71  Pulse: 62  Resp: 18  Temp: 98 F (36.7 C)  SpO2: 97%   Filed Weights   05/07/18 1316  Weight: 162 lb 9.6 oz (73.8 kg)    GENERAL:alert, no distress and comfortable SKIN: skin color, texture, turgor are normal, no rashes or significant lesions EYES: normal, Conjunctiva are pink and non-injected, sclera clear OROPHARYNX:no exudate, no erythema and lips, buccal mucosa, and tongue normal  NECK: supple, thyroid normal size, non-tender, without nodularity LYMPH:  no palpable lymphadenopathy in the cervical, axillary or inguinal LUNGS: clear to auscultation  and percussion with normal breathing effort HEART: regular rate & rhythm and no murmurs and no lower extremity edema ABDOMEN:abdomen soft, non-tender and normal bowel sounds Musculoskeletal:no cyanosis of digits and no clubbing  NEURO: alert & oriented x 3 with fluent speech, no focal motor/sensory deficits  LABORATORY DATA:  I have reviewed the data as listed CBC Latest Ref Rng & Units 05/05/2018 03/10/2018 02/27/2018  WBC 4.0 - 10.5 K/uL 5.0 4.3 4.3  Hemoglobin 12.0 - 15.0 g/dL 12.3 12.0 12.2  Hematocrit 36.0 - 46.0 % 36.8 35.8 37.4  Platelets 150 - 400 K/uL 156 163 157     CMP Latest Ref Rng & Units 05/05/2018 03/19/2018 03/10/2018  Glucose 70 - 99 mg/dL 92 87 89  BUN 8 - 23 mg/dL _0 Creatinine 0.44 - 1.00 mg/dL 1.37(H) 1.47(H) 1.12(H)  Sodium 135 - 145 mmol/L 145 142 147(H)  Potassium 3.5 - 5.1 mmol/L 4.0 4.0 3.0(LL)  Chloride 98 - 111 mmol/L 110 108 110  CO2 22 - 32 mmol/L _1 Calcium 8.9 - 10.3 mg/dL 9.2 9.6 9.0  Total Protein 6.5 - 8.1 g/dL 7.4 7.7 7.2  Total Bilirubin 0.3 - 1.2 mg/dL 0.4 0.4 0.5  Alkaline Phos 38 - 126 U/L 65 62 66  AST 15 - 41 U/L 21 19 17  ALT 0 - 44 U/L _0 RADIOGRAPHIC STUDIES: I have personally reviewed the radiological images as listed and agreed with the findings in the report. No results found.   ASSESSMENT & PLAN:  Haley Mcmillan is a 84 y.o. female with   1. Malignant gastrointestinal stromal tumor (GIST) of stomach, with liver and peritoneal metastasis -Diagnosed in 03/2016 and has been treated with Gleevec 449m daily since 04/22/16. Tolerating well.  -Her restaging scans have showed great response to [place]  -CT CAP from 05/05/18 was reviewed with pt in person. It shows mild response to treatment, overall stable disease, no new lesions. Gleevec is controlling her disease well and she is tolerating well. She will continue Gleevec, I refilled today  -She is clinically doing well without symptoms. Labs reviewed, Cr at  1.37. I strongly suggest she increase her water intake.  -Continue Gleevec -F/u in 3 months  2. Anemia, iron deficiency  -Continue oral ferous sulfate once daily.  -Currently resolved    3. HTN, uncontrolled  -Currently on Lisinopril 258mdaily, managed by PCP  -BP at 161/71 today (05/07/18)  4. Pulmonary nodules and groundglass change -noticed on previous CT scan, follow up scan showed stable pulmonary nodules, groundglass changes have resolved    Plan -Labs and CT scan reviewed with pt, disease is well controlled -continue Gleevec, refilled today  -Lab and f/u in 3 months       No problem-specific Assessment & Plan notes found for this encounter.   No orders of the defined types were placed in this encounter.  All questions were answered. The patient knows to call the clinic with any problems, questions or concerns. No barriers to learning was detected. I spent 20 minutes counseling the patient face to face. The total time spent in the appointment was 25 minutes and more than 50% was on counseling and review of test results     YaTruitt MerleMD 05/07/2018   I, AmJoslyn Devonam acting as scribe for YaTruitt MerleMD.   I have reviewed the above documentation for accuracy and completeness, and I agree with the above.

## 2018-06-01 MED FILL — IMATINIB MESYLATE 400 MG TA: 400 | 30 days supply | Qty: 30 | Fill #4

## 2018-07-05 MED FILL — IMATINIB MESYLATE 400 MG TA: 400 | 30 days supply | Qty: 30 | Fill #5

## 2018-07-07 ENCOUNTER — Telehealth: Payer: Self-pay

## 2018-07-07 ENCOUNTER — Telehealth: Payer: Self-pay | Admitting: Hematology

## 2018-07-07 NOTE — Telephone Encounter (Signed)
error 

## 2018-07-07 NOTE — Telephone Encounter (Signed)
Routing to dr burns in the absence of dr crawford:  Dr Deatra Ina (dentist) has called in stating patient will be having oral sx scheduled at beginning of next week (1/27)---this will require local anesthesia with epinephrine---patient reports she is on blood pressure medicine and has history of continual spikes with blood pressure---so this epi administration may possibly cause increased blood pressure---please advise in the absence of dr crawford how patient should proceed concerning blood pressure---also can we call in anti-anxiety medicine to help with dental sx fear patient has to be used with this sx---dr kaplan has already contacted patient's oncology doctor for advisement on chemo drug she is currently taking.  Please advise, I will call dr Kelby Fam office back at [place] is aware dr Sharlet Salina is out of the office and a response may not be given until tomorrow or Friday---thanks

## 2018-07-07 NOTE — Telephone Encounter (Signed)
I called her dentist Dr. Deatra Ina back, and cleared her for dental procedures.  Haley Mcmillan  07/07/2018

## 2018-07-07 NOTE — Telephone Encounter (Signed)
Your blood pressure is still having spikes she really needs to be seen for blood pressure medication can be adjusted.  Is her anyway she can come in tomorrow to see someone.  As long as her blood pressure is controlled she can have an epinephrine.

## 2018-07-08 NOTE — Telephone Encounter (Signed)
Patient has schedule office visit with dr burns 1/24---dr kaplan advised we need to get bp more controlled before proceeding with sx

## 2018-07-08 NOTE — Progress Notes (Signed)
Subjective:    Patient ID: Haley Mcmillan, female    DOB: Jul 16, 1933, 85 y.o.   MRN: [id number]  HPI The patient is here for an acute visit.  She is having oral surgery hopefully next month - she has a consult on the 6th of Feb.  She is having a tooth extracted.  She will be sedated and the oral surgeon will use epinephrine.  She was told the epi may cause her BP to go up.  Her BP has been variable for a while.    Hypertension: She is taking her lisinopril daily, but has been out of the amlodipine for while. She is compliant with a low sodium diet.  She denies chest pain, palpitations, edema, shortness of breath and regular headaches. She is exercising bowls once a week, walks dog daily, active.  She does not monitor her blood pressure at home because her cuff is not working - at her friends it was less than 140/90 twice-she did this on Monday, 4 days ago.       Medications and allergies reviewed with patient and updated if appropriate.  Patient Active Problem List   Diagnosis Date Noted  . Alopecia 08/12/2017  . Atherosclerosis of aorta ([place]) 05/12/2017  . Iron deficiency anemia due to chronic blood loss 05/12/2016  . Numbness of foot 05/05/2016  . 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 Liver nodule 04/08/2016  . Loss of weight 03/04/2016  . Routine health maintenance 12/25/2012  . Hyperlipidemia 06/21/2007  . Essential hypertension 06/21/2007  . Palpitations 06/21/2007    Current Outpatient Medications on File Prior to Visit  Medication Sig Dispense Refill  . amLODipine (NORVASC) 5 MG tablet TAKE 1 TABLET(5 MG) BY MOUTH DAILY 90 tablet 1  . Cholecalciferol (VITAMIN D3) 5000 units CAPS Take 5,000 Units by mouth daily.     . Cyanocobalamin (VITAMIN B 12 PO) Take 1 tablet by mouth daily.    Marland Kitchen imatinib (GLEEVEC) 400 MG tablet Take 1 tablet (400 mg total) by mouth daily. Take with meals and large glass of water.Caution:Chemotherapy. 30  tablet 5  . lisinopril (PRINIVIL,ZESTRIL) 20 MG tablet Take 1 tablet (20 mg total) by mouth daily. 90 tablet 1   No current facility-administered medications on file prior to visit.     Past Medical History:  Diagnosis Date  . Anemia   . Arthritis   . Colon polyps   . GIST DX'D  03/2016  . Olecranon bursitis    Elbow  . Other and unspecified hyperlipidemia   . Palpitations   . Unspecified essential hypertension     Past Surgical History:  Procedure Laterality Date  . COLONOSCOPY W/ POLYPECTOMY  01/2004  . CRYOTHERAPY     female-cryosurgery stated by pt  . ESOPHAGOGASTRODUODENOSCOPY N/A 04/09/2016   Procedure: ESOPHAGOGASTRODUODENOSCOPY (EGD);  Surgeon: Doran Stabler, MD;  Location: [facility] ENDOSCOPY;  Service: Endoscopy;  Laterality: N/A;  . POLYPECTOMY     from vocal surgery    Social History   Socioeconomic History  . Marital status: Married    Spouse name: Not on file  . Number of children: 1  . Years of education: Not on file  . Highest education level: Not on file  Occupational History  . Occupation: retired    Fish farm manager: RETIRED  Social Needs  . Financial resource strain: Not on file  . Food insecurity:    Worry: Not on file    Inability: Not on file  .  Transportation needs:    Medical: Not on file    Non-medical: Not on file  Tobacco Use  . Smoking status: Former Smoker    Packs/day: 0.50    Years: 50.00    Pack years: 25.00    Types: Cigarettes    Last attempt to quit: 10/31/1994    Years since quitting: 23.7  . Smokeless tobacco: Never Used  . Tobacco comment: SMOKED SOME; QUIT 1996;   Substance and Sexual Activity  . Alcohol use: Yes    Alcohol/week: 0.0 standard drinks    Comment: wine  . Drug use: No  . Sexual activity: Not on file  Lifestyle  . Physical activity:    Days per week: Not on file    Minutes per session: Not on file  . Stress: Not on file  Relationships  . Social connections:    Talks on phone: Not on file    Gets  together: Not on file    Attends religious service: Not on file    Active member of club or organization: Not on file    Attends meetings of clubs or organizations: Not on file    Relationship status: Not on file  Other Topics Concern  . 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          Family History  Problem Relation Age of Onset  . Gallstones Mother   . Aneurysm Father        brain  . Heart disease Father        CAD  . Breast cancer Sister   . Breast cancer Sister   . Heart attack Brother   . Cancer Brother   . Lung cancer Son   . Diabetes Neg Hx     Review of Systems  Constitutional: Negative for chills and fever.  Respiratory: Negative for cough, shortness of breath and wheezing.   Cardiovascular: Negative for chest pain, palpitations and leg swelling.  Neurological: Positive for headaches (occ - from not eating). Negative for dizziness and light-headedness.       Objective:   Vitals:   07/09/18 1055  BP: (!) 188/72  Pulse: (!) 57  Resp: 16  Temp: 99 F (37.2 C)  SpO2: 97%   BP Readings from Last 3 Encounters:  07/09/18 (!) 188/72  05/07/18 (!) 161/71  03/19/18 132/68   Wt Readings from Last 3 Encounters:  07/09/18 165 lb (74.8 kg)  05/07/18 162 lb 9.6 oz (73.8 kg)  03/19/18 158 lb (71.7 kg)   Body mass index is 25.84 kg/m.   Physical Exam    Constitutional: Appears well-developed and well-nourished. No distress.  HENT:  Head: Normocephalic and atraumatic.  Neck: Neck supple. No tracheal deviation present. No thyromegaly present.  No cervical lymphadenopathy Cardiovascular: Normal rate, regular rhythm and normal heart sounds.   No murmur heard. No carotid bruit .  No edema Pulmonary/Chest: Effort  normal and breath sounds normal. No respiratory distress. No has no wheezes. No rales.  Skin: Skin is warm and dry. Not diaphoretic.  Psychiatric: Normal mood and affect. Behavior is normal.       Assessment & Plan:    See Problem List for  Assessment and Plan of chronic medical problems.

## 2018-07-09 ENCOUNTER — Encounter: Payer: Self-pay | Admitting: Internal Medicine

## 2018-07-09 ENCOUNTER — Ambulatory Visit (INDEPENDENT_AMBULATORY_CARE_PROVIDER_SITE_OTHER): Payer: Medicare Other | Admitting: Internal Medicine

## 2018-07-09 VITALS — BP 188/72 | HR 57 | Temp 99.0°F | Resp 16 | Ht 67.0 in | Wt 165.0 lb

## 2018-07-09 DIAGNOSIS — I10 Essential (primary) hypertension: Secondary | ICD-10-CM | POA: Diagnosis not present

## 2018-07-09 MED ORDER — BLOOD PRESSURE CUFF MISC: 0 refills | Status: DC

## 2018-07-09 MED ORDER — AMLODIPINE BESYLATE 5 MG PO TABS
ORAL_TABLET | ORAL | 1 refills | Status: DC
Start: 2018-07-09 — End: 2019-01-14

## 2018-07-09 NOTE — Patient Instructions (Signed)
Restart the amlodipine 5 mg daily.  Continue the lisinopril 20 mg daily.  Monitor your BP daily at home  - keep a log.     Follow up with Dr Sharlet Salina in 10-14 days.

## 2018-07-09 NOTE — Assessment & Plan Note (Signed)
Blood pressure high here today and has been variable for patient She is taking lisinopril daily, but has not been out of the amlodipine for a while Restart amlodipine 5 mg daily Continue lisinopril 20 mg daily Stressed getting her blood pressure better controlled Advised regular blood pressure cuff-prescription given to see if it is covered-monitor blood pressure daily and keep a log Follow-up in 10-14 days with Dr. Sharlet Salina to recheck and adjust medication

## 2018-07-20 ENCOUNTER — Ambulatory Visit (INDEPENDENT_AMBULATORY_CARE_PROVIDER_SITE_OTHER): Payer: Medicare Other | Admitting: Internal Medicine

## 2018-07-20 ENCOUNTER — Encounter: Payer: Self-pay | Admitting: Internal Medicine

## 2018-07-20 DIAGNOSIS — I10 Essential (primary) hypertension: Secondary | ICD-10-CM

## 2018-07-20 NOTE — Assessment & Plan Note (Addendum)
BP normal on recheck and average at home is down from prior as well. She is having some lower readings at home and some higher readings and most discrepancies are based on time of day. Continue amlodipine 5 mg daily and lisinopril 20 mg daily.

## 2018-07-20 NOTE — Patient Instructions (Addendum)
I think that you are safe to do the dental work. Keep taking the lisinopril and the amlodipine.

## 2018-07-20 NOTE — Progress Notes (Signed)
   Subjective:   Patient ID: Haley Mcmillan, female    DOB: 01/28/1934, 85 y.o.   MRN: [id number]  HPI The patient is an 85 YO female coming in for blood pressure follow up. She was started back on amlodipine about 1 week ago and has been taking this. She is currently taking lisinopril 20 mg daily and amlodipine 5 mg daily. Denies side effects. Denies SOB or chest pains. Denies headaches.   Review of Systems  Constitutional: Negative.   HENT: Positive for dental problem.   Eyes: Negative.   Respiratory: Negative for cough, chest tightness and shortness of breath.   Cardiovascular: Negative for chest pain, palpitations and leg swelling.  Gastrointestinal: Negative for abdominal distention, abdominal pain, constipation, diarrhea, nausea and vomiting.  Musculoskeletal: Negative.   Skin: Negative.   Neurological: Negative.     Objective:  Physical Exam Constitutional:      Appearance: She is well-developed.  HENT:     Head: Normocephalic and atraumatic.  Neck:     Musculoskeletal: Normal range of motion.  Cardiovascular:     Rate and Rhythm: Normal rate and regular rhythm.  Pulmonary:     Effort: Pulmonary effort is normal. No respiratory distress.     Breath sounds: Normal breath sounds. No wheezing or rales.  Abdominal:     General: Bowel sounds are normal. There is no distension.     Palpations: Abdomen is soft.     Tenderness: There is no abdominal tenderness. There is no rebound.  Skin:    General: Skin is warm and dry.  Neurological:     Mental Status: She is alert and oriented to person, place, and time.     Coordination: Coordination normal.     Vitals:   07/20/18 1039 07/20/18 1111  BP: (!) 160/70 138/72  Pulse: 68   Temp: 98.1 F (36.7 C)   TempSrc: Oral   SpO2: 98%   Weight: 162 lb (73.5 kg)   Height: 5\' 7"  (1.702 m)     Assessment & Plan:

## 2018-07-28 NOTE — Progress Notes (Signed)
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    04/09/2016 Initial Biopsy    Gastric mass biopsy showed gastrointestinal stromal tumor, I feel mitosis and necrosis, intermediate to high-grade.    04/09/2016 Imaging    CT chest, abdomen and pelvis with contrast showed a large exophytic mass in the proximal lateral stomach measuring 10 x 7.4 cm, nodular lesions within omentum and multiple liver nodules are highly suspicious for metastatic disease.    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 Today's study demonstrates a positive response to therapy with regression of the primary gastric neoplasm, as well as regression of numerous intraperitoneal metastatic lesions.  Additionally, the liver lesions seen on the prior study appear far more well-defined, decreased in size and are much lower attenuation, suggesting internal areas of necrosis. 2. Several small pulmonary nodules are noted, some of which are new compared to the prior examination. These are nonspecific, but metastatic disease to the lungs is not excluded. Attention on follow-up imaging is recommended. 3. Importantly, there is also widespread ground-glass attenuation and septal thickening in the lungs bilaterally which is new compared to the prior study. In the appropriate clinical setting, this could reflect a drug reaction. Other differential considerations include atypical infection, or even developing interstitial lung disease. Clinical correlation is suggested. 4. Aortic atherosclerosis, in addition to right coronary artery disease. 5. Mild diffuse bronchial wall thickening with mild centrilobular and paraseptal emphysema; imaging findings suggestive of underlying COPD. 6. Additional incidental findings, as above.    09/22/2016 Imaging     CT CAP W CONTRAST   IMPRESSION: 1. Nodular appearing GI stromal tumor along the posterior gastric wall with associated peritoneal implants and hepatic metastases, stable from 07/16/2016. 2. Improving mid and lower lung zone predominant peribronchovascular ground-glass, possibly related to drug therapy. 3. A few scattered pulmonary nodules are stable. 4. Aortic atherosclerosis (ICD10-170.0). Coronary artery calcification.    01/14/2017 Imaging    CT CAP w Contrast  IMPRESSION: 1. Further decrease in size of large GI stromal tumor arising from the greater curvature of the stomach with improvement in hepatic and multifocal peritoneal metastases. 2. Further improvement in lower lung predominant peribronchovascular ground-glass density, probably inflammatory (drug reaction or or infectious). 3. No  acute findings. 4.  Aortic Atherosclerosis  (ICD10-I70.0).    05/08/2017 Imaging    IMPRESSION: 1. Overall, today's examination is very similar to recent prior studies again demonstrating a large proximal gastric mass with adjacent gastrosplenic ligament lymphadenopathy, multiple treated lesions in the liver, and other intraperitoneal metastases, as discussed above. No definite new sites of metastatic disease are otherwise noted. 2. Aortic atherosclerosis, in addition to right coronary artery disease. 3. Mild cardiomegaly. 4. There are calcifications of the aortic valve. Echocardiographic correlation for evaluation of potential valvular dysfunction may be warranted if clinically indicated. 5. Additional incidental findings, as above. Aortic Atherosclerosis (ICD10-I70.0).    09/07/2017 Imaging    CT CAP W Contrast 09/07/17 IMPRESSION: 1. Large mass extending off the greater curvature of the stomach is similar to prior. Majority of nodularity within the adjacent mesentery is similar. There is one adjacent nodule which has increased in size when compared to prior exam. 2. Grossly unchanged treated hepatic lesions.    01/04/2018 Imaging    CT CAP WO COntrast 01/04/18 IMPRESSION: 1. No substantial interval change in exam. 2. Irregular mass at the proximal stomach is similar to prior. Scattered soft tissue lesions in the perigastric reason, omentum, and mesentery are similar to prior. No generalized trend towards worsening or improving disease. No new or progressive findings on today's study. 3. Stable appearance multiple hepatic lesions. 4.  Aortic Atherosclerois (ICD10-170.0)    05/05/2018 Imaging    CT CAP 05/05/18  IMPRESSION: 1. Mild response to therapy, as evidenced by decreased size of many soft tissue lesions along the greater curvature of the stomach, as well as within the omentum. 2. Similar hepatic metastasis. 3.  No acute process or evidence of metastatic disease in the chest. 4.  Aortic Atherosclerosis  (ICD10-I70.0). 5. Pulmonary artery enlargement suggests pulmonary arterial hypertension.      CURRENT THERAPY:  Gleevec 464m daily starting 04/22/16  INTERVAL HISTORY:  Haley FRIERis here for a follow up of her GIST. She presents to the clinic today by herself. She notes she is doing well. She plans to have dental surgery for 3 teeth extraction on 08/05/18. She denies any abdominal pain or issues. She notes her energy level is adequate.      REVIEW OF SYSTEMS:   Constitutional: Denies fevers, chills or abnormal weight loss Eyes: Denies blurriness of vision Ears, nose, mouth, throat, and face: Denies mucositis or sore throat Respiratory: Denies cough, dyspnea or wheezes Cardiovascular: Denies palpitation, chest discomfort or lower extremity swelling Gastrointestinal:  Denies nausea, heartburn or change in bowel habits Skin: Denies abnormal skin rashes Lymphatics: Denies new lymphadenopathy or easy bruising Neurological:Denies numbness, tingling or new weaknesses Behavioral/Psych: Mood is stable, no new changes  All other systems were reviewed with the patient and are negative.  MEDICAL HISTORY:  Past Medical History:  Diagnosis Date  . Anemia   . Arthritis   . Colon polyps   . GIST DX'D  03/2016  . Olecranon bursitis    Elbow  . Other and unspecified hyperlipidemia   . Palpitations   . Unspecified essential hypertension     SURGICAL HISTORY: Past Surgical History:  Procedure Laterality Date  . COLONOSCOPY W/ POLYPECTOMY  01/2004  . CRYOTHERAPY     female-cryosurgery stated by pt  . ESOPHAGOGASTRODUODENOSCOPY N/A 04/09/2016   Procedure: ESOPHAGOGASTRODUODENOSCOPY (EGD);  Surgeon: HDoran Stabler MD;  [facility];  Service: Endoscopy;  Laterality: N/A;  . POLYPECTOMY     from vocal surgery  I have reviewed the social history and family history with the patient and they are unchanged from previous note.  ALLERGIES:  has No Known  Allergies.  MEDICATIONS:  Current Outpatient Medications  Medication Sig Dispense Refill  . amLODipine (NORVASC) 5 MG tablet TAKE 1 TABLET(5 MG) BY MOUTH DAILY 90 tablet 1  . Blood Pressure Monitoring (BLOOD PRESSURE CUFF) MISC Measure BP daily  Dx: hypertension 1 each 0  . Cholecalciferol (VITAMIN D3) 5000 units CAPS Take 5,000 Units by mouth daily.     . Cyanocobalamin (VITAMIN B 12 PO) Take 1 tablet by mouth daily.    Marland Kitchen imatinib (GLEEVEC) 400 MG tablet Take 1 tablet (400 mg total) by mouth daily. Take with meals and large glass of water.Caution:Chemotherapy. 30 tablet 5  . lisinopril (PRINIVIL,ZESTRIL) 20 MG tablet Take 1 tablet (20 mg total) by mouth daily. 90 tablet 1   No current facility-administered medications for this visit.     PHYSICAL EXAMINATION: ECOG PERFORMANCE STATUS: 1 - Symptomatic but completely ambulatory  Vitals:   07/30/18 1347  BP: 136/63  Pulse: 62  Resp: 17  Temp: 98.4 F (36.9 C)  SpO2: 96%   Filed Weights   07/30/18 1347  Weight: 161 lb 8 oz (73.3 kg)    GENERAL:alert, no distress and comfortable SKIN: skin color, texture, turgor are normal, no rashes or significant lesions EYES: normal, Conjunctiva are pink and non-injected, sclera clear OROPHARYNX:no exudate, no erythema and lips, buccal mucosa, and tongue normal  NECK: supple, thyroid normal size, non-tender, without nodularity LYMPH:  no palpable lymphadenopathy in the cervical, axillary or inguinal LUNGS: clear to auscultation and percussion with normal breathing effort HEART: regular rate & rhythm and no murmurs and no lower extremity edema ABDOMEN:abdomen soft, non-tender and normal bowel sounds Musculoskeletal:no cyanosis of digits and no clubbing  NEURO: alert & oriented x 3 with fluent speech, no focal motor/sensory deficits  LABORATORY DATA:  I have reviewed the data as listed CBC Latest Ref Rng & Units 07/30/2018 05/05/2018 03/10/2018  WBC 4.0 - 10.5 K/uL 4.4 5.0 4.3  Hemoglobin  12.0 - 15.0 g/dL 12.6 12.3 12.0  Hematocrit 36.0 - 46.0 % 37.7 36.8 35.8  Platelets 150 - 400 K/uL 154 156 163     CMP Latest Ref Rng & Units 07/30/2018 05/05/2018 03/19/2018  Glucose 70 - 99 mg/dL 86 92 87  BUN 8 - 23 mg/dL _0 Creatinine 0.44 - 1.00 mg/dL 1.28(H) 1.37(H) 1.47(H)  Sodium 135 - 145 mmol/L 143 145 142  Potassium 3.5 - 5.1 mmol/L 4.1 4.0 4.0  Chloride 98 - 111 mmol/L 109 110 108  CO2 22 - 32 mmol/L _1 Calcium 8.9 - 10.3 mg/dL 9.2 9.2 9.6  Total Protein 6.5 - 8.1 g/dL 7.3 7.4 7.7  Total Bilirubin 0.3 - 1.2 mg/dL 0.5 0.4 0.4  Alkaline Phos 38 - 126 U/L 60 65 62  AST 15 - 41 U/L _2 ALT 0 - 44 U/L _3 RADIOGRAPHIC STUDIES: I have personally reviewed the radiological images as listed and agreed with the findings in the report. No results found.   ASSESSMENT & PLAN:  Haley Mcmillan is a 85 y.o. female with   1. Malignant gastrointestinal stromal tumor (GIST) of stomach, with liver and peritoneal metastasis -Diagnosed in 03/2016 and has been treated with Gleevec 440m daily since 04/22/16. Tolerating well.  -Her restaging scans have showed great response to GAdrian  -She  is clinically doing well without symptoms. Labs reviewed, RBC at 3.53, MCV at 106.8, MCH at 35.7 otherwise CBC WNL, Cr at 1.28 otherwise CMP is normal. Physical exam was unremarkable.  -She is fine to proceed with teeth extraction next week.  -She will continue Gleevec, -Next CT scan in 09/2018. She is fine to use clear contrast by radiology department.  -F/u in 2 months   2. Anemia, iron deficiency  -Continue oral ferous sulfate once daily.  -Currently resolved   3. HTN, uncontrolled -Currently on amlodipine 5 mg and lisinopril 8m daily, managed by PCP  -BP normal today at 136/63 (07/30/18), better controlled.  4. Pulmonary nodules and groundglass change -noticed on previous CT scan, follow up scan showed stable pulmonary nodules, groundglass changes have  resolved  5. CKD, Stage III -Cr at 1.28 today (07/30/18), overall stable. -I advised her to avoid regular use of Advil or Aleve  -I encouraged her to drink more water  Plan -continue Gleevec, refilled today  -F/u in 2 months with lab and CT CAP with oral contrast a few days before    No problem-specific Assessment & Plan notes found for this encounter.   Orders Placed This Encounter  Procedures  . CT Abdomen Pelvis Wo Contrast    Standing Status:   Future    Standing Expiration Date:   07/30/2019    Order Specific Question:   [facility]    [facility]   Order Specific Question:   Is Oral Contrast requested for this exam?    Answer:   Yes, Per Radiology protocol    Order Specific Question:   Radiology Contrast Protocol - do NOT remove file path    Answer:   \\[email or web address]  . CT Chest Wo Contrast    Standing Status:   Future    Standing Expiration Date:   07/30/2019    Order Specific Question:   [facility]    [facility]   Order Specific Question:   Radiology Contrast Protocol - do NOT remove file path    Answer:   \\[email or web address]   All questions were answered. The patient knows to call the clinic with any problems, questions or concerns. No barriers to learning was detected. I spent 20 minutes counseling the patient face to face. The total time spent in the appointment was 25 minutes and more than 50% was on counseling and review of test results     YTruitt Merle MD 07/30/2018   I, AJoslyn Devon am acting as scribe for YTruitt Merle MD.   I have reviewed the above documentation for accuracy and completeness, and I agree with the above.

## 2018-07-29 MED FILL — IMATINIB MESYLATE 400 MG TA: 400 | 30 days supply | Qty: 30 | Fill #0

## 2018-07-30 ENCOUNTER — Telehealth: Payer: Self-pay | Admitting: Hematology

## 2018-07-30 ENCOUNTER — Encounter: Payer: Self-pay | Admitting: Hematology

## 2018-07-30 ENCOUNTER — Inpatient Hospital Stay: Payer: Medicare Other

## 2018-07-30 ENCOUNTER — Inpatient Hospital Stay: Payer: Medicare Other | Attending: Nurse Practitioner | Admitting: Hematology

## 2018-07-30 VITALS — BP 136/63 | HR 62 | Temp 98.4°F | Resp 17 | Ht 67.0 in | Wt 161.5 lb

## 2018-07-30 DIAGNOSIS — C49A2 Gastrointestinal stromal tumor of stomach: Secondary | ICD-10-CM

## 2018-07-30 DIAGNOSIS — C786 Secondary malignant neoplasm of retroperitoneum and peritoneum: Secondary | ICD-10-CM | POA: Diagnosis not present

## 2018-07-30 DIAGNOSIS — I129 Hypertensive chronic kidney disease with stage 1 through stage 4 chronic kidney disease, or unspecified chronic kidney disease: Secondary | ICD-10-CM | POA: Insufficient documentation

## 2018-07-30 DIAGNOSIS — D509 Iron deficiency anemia, unspecified: Secondary | ICD-10-CM | POA: Insufficient documentation

## 2018-07-30 DIAGNOSIS — C787 Secondary malignant neoplasm of liver and intrahepatic bile duct: Secondary | ICD-10-CM | POA: Diagnosis not present

## 2018-07-30 DIAGNOSIS — R918 Other nonspecific abnormal finding of lung field: Secondary | ICD-10-CM | POA: Diagnosis not present

## 2018-07-30 DIAGNOSIS — D500 Iron deficiency anemia secondary to blood loss (chronic): Secondary | ICD-10-CM

## 2018-07-30 DIAGNOSIS — I10 Essential (primary) hypertension: Secondary | ICD-10-CM

## 2018-07-30 DIAGNOSIS — Z79899 Other long term (current) drug therapy: Secondary | ICD-10-CM | POA: Diagnosis not present

## 2018-07-30 DIAGNOSIS — N183 Chronic kidney disease, stage 3 (moderate): Secondary | ICD-10-CM | POA: Insufficient documentation

## 2018-07-30 LAB — CBC WITH DIFFERENTIAL/PLATELET
ABS IMMATURE GRANULOCYTES: 0.01 10*3/uL (ref 0.00–0.07)
Basophils Absolute: 0 10*3/uL (ref 0.0–0.1)
Basophils Relative: 1 %
EOS ABS: 0.2 10*3/uL (ref 0.0–0.5)
Eosinophils Relative: 5 %
HCT: 37.7 % (ref 36.0–46.0)
Hemoglobin: 12.6 g/dL (ref 12.0–15.0)
Immature Granulocytes: 0 %
Lymphocytes Relative: 39 %
Lymphs Abs: 1.7 10*3/uL (ref 0.7–4.0)
MCH: 35.7 pg — AB (ref 26.0–34.0)
MCHC: 33.4 g/dL (ref 30.0–36.0)
MCV: 106.8 fL — AB (ref 80.0–100.0)
MONO ABS: 0.4 10*3/uL (ref 0.1–1.0)
MONOS PCT: 9 %
NEUTROS ABS: 2 10*3/uL (ref 1.7–7.7)
Neutrophils Relative %: 46 %
PLATELETS: 154 10*3/uL (ref 150–400)
RBC: 3.53 MIL/uL — ABNORMAL LOW (ref 3.87–5.11)
RDW: 11.9 % (ref 11.5–15.5)
WBC: 4.4 10*3/uL (ref 4.0–10.5)
nRBC: 0 % (ref 0.0–0.2)

## 2018-07-30 LAB — COMPREHENSIVE METABOLIC PANEL
ALT: 8 U/L (ref 0–44)
ANION GAP: 8 (ref 5–15)
AST: 20 U/L (ref 15–41)
Albumin: 4.2 g/dL (ref 3.5–5.0)
Alkaline Phosphatase: 60 U/L (ref 38–126)
BUN: 12 mg/dL (ref 8–23)
CALCIUM: 9.2 mg/dL (ref 8.9–10.3)
CO2: 26 mmol/L (ref 22–32)
Chloride: 109 mmol/L (ref 98–111)
Creatinine, Ser: 1.28 mg/dL — ABNORMAL HIGH (ref 0.44–1.00)
GFR, EST AFRICAN AMERICAN: 45 mL/min — AB (ref >60)
GFR, EST NON AFRICAN AMERICAN: 39 mL/min — AB (ref >60)
GLUCOSE: 86 mg/dL (ref 70–99)
POTASSIUM: 4.1 mmol/L (ref 3.5–5.1)
SODIUM: 143 mmol/L (ref 135–145)
TOTAL PROTEIN: 7.3 g/dL (ref 6.5–8.1)
Total Bilirubin: 0.5 mg/dL (ref 0.3–1.2)

## 2018-07-30 NOTE — Telephone Encounter (Signed)
Scheduled appt per 02/14 los.  Gave patient contrast, and the number to central radiology.  Printed calendar and avs.

## 2018-08-30 MED FILL — IMATINIB MESYLATE 400 MG TA: 400 | 30 days supply | Qty: 30 | Fill #1

## 2018-09-23 ENCOUNTER — Ambulatory Visit: Payer: Medicare Other

## 2018-09-23 NOTE — Progress Notes (Unsigned)
Subjective:   Haley Mcmillan is a 85 y.o. female who presents for Medicare Annual (Subsequent) preventive examination.  I connected with patient 09/23/18 at  3:00 PM EDT by a video enabled telemedicine application and verified that I am speaking with the correct person using two identifiers, patient stated full name and DOB. Patient gave permission to proceed with virtual visit. Patient was located at home and Emelia Loron RN was located in [place] office.  Review of Systems:  No ROS.  Medicare Wellness Visit. Additional risk factors are reflected in the social history.    Sleep patterns: no sleep issues, feels rested on waking, gets up 0-1 times nightly to void and sleeps 7-8 hours nightly.    Home Safety/Smoke Alarms: Feels safe in home. Smoke alarms in place.  Living environment; residence and Firearm Safety: 1-story house/ trailer. Lives with husband, no needs for DME, good support system Seat Belt Safety/Bike Helmet: Wears seat belt.     Objective:     Vitals: There were no vitals taken for this visit.  There is no height or weight on file to calculate BMI.  Advanced Directives 02/27/2018 11/04/2017 09/09/2017 09/25/2016 07/21/2016 04/21/2016 04/08/2016  Does Patient Have a Medical Advance Directive? No No No Yes No No No  Type of Advance Directive - - Public librarian;Living will - - -  Does patient want to make changes to medical advance directive? - - - Yes (MAU/Ambulatory/Procedural Areas - Information given) - - -  Copy of [place] in Chart? - - - - - - -  Would patient like information on creating a medical advance directive? No - Patient declined - - - - - No - patient declined information    Tobacco Social History   Tobacco Use  Smoking Status Former Smoker  . Packs/day: 0.50  . Years: 50.00  . Pack years: 25.00  . Types: Cigarettes  . Last attempt to quit: 10/31/1994  . Years since quitting: 23.9  Smokeless Tobacco Never Used   Tobacco Comment   SMOKED SOME; QUIT 1996;      Counseling given: Not Answered Comment: SMOKED SOME; QUIT 1996;   Past Medical History:  Diagnosis Date  . Anemia   . Arthritis   . Colon polyps   . GIST DX'D  03/2016  . Olecranon bursitis    Elbow  . Other and unspecified hyperlipidemia   . Palpitations   . Unspecified essential hypertension    Past Surgical History:  Procedure Laterality Date  . COLONOSCOPY W/ POLYPECTOMY  01/2004  . CRYOTHERAPY     female-cryosurgery stated by pt  . ESOPHAGOGASTRODUODENOSCOPY N/A 04/09/2016   Procedure: ESOPHAGOGASTRODUODENOSCOPY (EGD);  Surgeon: Doran Stabler, MD;  Location: [facility] ENDOSCOPY;  Service: Endoscopy;  Laterality: N/A;  . POLYPECTOMY     from vocal surgery   Family History  Problem Relation Age of Onset  . Gallstones Mother   . Aneurysm Father        brain  . Heart disease Father        CAD  . Breast cancer Sister   . Breast cancer Sister   . Heart attack Brother   . Cancer Brother   . Lung cancer Son   . Diabetes Neg Hx    Social History   Socioeconomic History  . Marital status: Married    Spouse name: Not on file  . Number of children: 1  . Years of education: Not on file  .  Highest education level: Not on file  Occupational History  . Occupation: retired    Fish farm manager: RETIRED  Social Needs  . Financial resource strain: Not on file  . Food insecurity:    Worry: Not on file    Inability: Not on file  . Transportation needs:    Medical: Not on file    Non-medical: Not on file  Tobacco Use  . Smoking status: Former Smoker    Packs/day: 0.50    Years: 50.00    Pack years: 25.00    Types: Cigarettes    Last attempt to quit: 10/31/1994    Years since quitting: 23.9  . Smokeless tobacco: Never Used  . Tobacco comment: SMOKED SOME; QUIT 1996;   Substance and Sexual Activity  . Alcohol use: Yes    Alcohol/week: 0.0 standard drinks    Comment: Raeanne Deschler  . Drug use: No  . Sexual activity: Not on file   Lifestyle  . Physical activity:    Days per week: Not on file    Minutes per session: Not on file  . Stress: Not on file  Relationships  . Social connections:    Talks on phone: Not on file    Gets together: Not on file    Attends religious service: Not on file    Active member of club or organization: Not on file    Attends meetings of clubs or organizations: Not on file    Relationship status: Not on file  Other Topics Concern  . 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          Outpatient Encounter Medications as of 09/23/2018  Medication Sig  . amLODipine (NORVASC) 5 MG tablet TAKE 1 TABLET(5 MG) BY MOUTH DAILY  . Blood Pressure Monitoring (BLOOD PRESSURE CUFF) MISC Measure BP daily  Dx: hypertension  . Cholecalciferol (VITAMIN D3) 5000 units CAPS Take 5,000 Units by mouth daily.   . Cyanocobalamin (VITAMIN B 12 PO) Take 1 tablet by mouth daily.  Marland Kitchen imatinib (GLEEVEC) 400 MG tablet Take 1 tablet (400 mg total) by mouth daily. Take with meals and large glass of water.Caution:Chemotherapy.  Marland Kitchen lisinopril (PRINIVIL,ZESTRIL) 20 MG tablet Take 1 tablet (20 mg total) by mouth daily.   No facility-administered encounter medications on file as of 09/23/2018.     Activities of Daily Living No flowsheet data found.  Patient Care Team: Hoyt Koch, MD as PCP - General (Internal Medicine) Loletha Carrow Kirke Corin, MD as Consulting Physician (Gastroenterology) Truitt Merle, MD as Consulting Physician (Hematology)    Assessment:   This is a routine wellness examination for Haley Mcmillan. Physical assessment deferred to PCP.  Exercise Activities and Dietary recommendations   Diet (meal preparation, eat out, water intake, caffeinated beverages,  dairy products, fruits and vegetables): in general, a "healthy" diet  , well balanced   Reviewed heart healthy diet.Encouraged patient to increase daily water and healthy fluid intake.  Goals    . Have 3 meals a day     Start buying groceries and start cooking again;        Fall Risk Fall Risk  03/19/2018 03/17/2017 05/17/2015 04/24/2015  Falls in the past year? No  No No No   Depression Screen PHQ 2/9 Scores 03/19/2018 03/17/2017 05/17/2015 05/17/2015  PHQ - 2 Score 0 0 0 1     Cognitive Function MMSE - Mini Mental State Exam 05/17/2015  Not completed: (No Data)       Ad8 score reviewed for issues:  Issues making decisions: no  Less interest in hobbies / activities: no  Repeats questions, stories (family complaining): no  Trouble using ordinary gadgets (microwave, computer, phone):no  Forgets the month or year: no  Mismanaging finances: no  Remembering appts: no  Daily problems with thinking and/or memory: no Ad8 score is= 0  Immunization History  Administered Date(s) Administered  . Influenza, High Dose Seasonal PF 03/17/2017  . Influenza,inj,Quad PF,6+ Mos 05/12/2016  . Pneumococcal Conjugate-13 05/23/2015  . Pneumococcal Polysaccharide-23 12/22/2012  . Tetanus 12/22/2012   Screening Tests Health Maintenance  Topic Date Due  . INFLUENZA VACCINE  01/15/2019  . TETANUS/TDAP  12/23/2022  . DEXA SCAN  Completed  . PNA vac Low Risk Adult  Completed      Plan:    Reviewed health maintenance screenings with patient today and relevant education, vaccines, and/or referrals were provided.   Continue to eat heart healthy diet (full of fruits, vegetables, whole grains, lean protein, water--limit salt, fat, and sugar intake) and increase physical activity as tolerated.  Continue doing brain stimulating activities (puzzles, reading, adult coloring books, staying active) to keep memory sharp.   I have personally reviewed and noted the following in the patient's chart:    . Medical and social history . Use of alcohol, tobacco or illicit drugs  . Current medications and supplements . Functional ability and status . Nutritional status . Physical activity . Advanced directives . List of other physicians . Vitals . Screenings to include cognitive, depression, and falls . Referrals and appointments  In addition, I have reviewed and discussed with patient certain preventive protocols, quality metrics, and best practice recommendations. A written personalized care plan for preventive services as well as general preventive health recommendations were provided to patient.     Michiel Cowboy, RN  09/23/2018

## 2018-09-27 ENCOUNTER — Ambulatory Visit (INDEPENDENT_AMBULATORY_CARE_PROVIDER_SITE_OTHER): Payer: Medicare Other | Admitting: *Deleted

## 2018-09-27 ENCOUNTER — Other Ambulatory Visit: Payer: Medicare Other

## 2018-09-27 DIAGNOSIS — Z0000 Encounter for general adult medical examination without abnormal findings: Secondary | ICD-10-CM

## 2018-09-27 NOTE — Patient Instructions (Signed)
Continue doing brain stimulating activities (puzzles, reading, adult coloring books, staying active) to keep memory sharp.   Continue to eat heart healthy diet (full of fruits, vegetables, whole grains, lean protein, water--limit salt, fat, and sugar intake) and increase physical activity as tolerated.  Health Maintenance, Female Adopting a healthy lifestyle and getting preventive care can go a long way to promote health and wellness. Talk with your health care provider about what schedule of regular examinations is right for you. This is a good chance for you to check in with your provider about disease prevention and staying healthy. In between checkups, there are plenty of things you can do on your own. Experts have done a lot of research about which lifestyle changes and preventive measures are most likely to keep you healthy. Ask your health care provider for more information. Weight and diet Eat a healthy diet  Be sure to include plenty of vegetables, fruits, low-fat dairy products, and lean protein.  Do not eat a lot of foods high in solid fats, added sugars, or salt.  Get regular exercise. This is one of the most important things you can do for your health. ? Most adults should exercise for at least 150 minutes each week. The exercise should increase your heart rate and make you sweat (moderate-intensity exercise). ? Most adults should also do strengthening exercises at least twice a week. This is in addition to the moderate-intensity exercise. Maintain a healthy weight  Body mass index (BMI) is a measurement that can be used to identify possible weight problems. It estimates body fat based on height and weight. Your health care provider can help determine your BMI and help you achieve or maintain a healthy weight.  For females 20 years of age and older: ? A BMI below 18.5 is considered underweight. ? A BMI of 18.5 to 24.9 is normal. ? A BMI of 25 to 29.9 is considered  overweight. ? A BMI of 30 and above is considered obese. Watch levels of cholesterol and blood lipids  You should start having your blood tested for lipids and cholesterol at 83 years of age, then have this test every 5 years.  You may need to have your cholesterol levels checked more often if: ? Your lipid or cholesterol levels are high. ? You are older than 83 years of age. ? You are at high risk for heart disease. Cancer screening Lung Cancer  Lung cancer screening is recommended for adults 55-80 years old who are at high risk for lung cancer because of a history of smoking.  A yearly low-dose CT scan of the lungs is recommended for people who: ? Currently smoke. ? Have quit within the past 15 years. ? Have at least a 30-pack-year history of smoking. A pack year is smoking an average of one pack of cigarettes a day for 1 year.  Yearly screening should continue until it has been 15 years since you quit.  Yearly screening should stop if you develop a health problem that would prevent you from having lung cancer treatment. Breast Cancer  Practice breast self-awareness. This means understanding how your breasts normally appear and feel.  It also means doing regular breast self-exams. Let your health care provider know about any changes, no matter how small.  If you are in your 20s or 30s, you should have a clinical breast exam (CBE) by a health care provider every 1-3 years as part of a regular health exam.  If you are 40 or   older, have a CBE every year. Also consider having a breast X-ray (mammogram) every year.  If you have a family history of breast cancer, talk to your health care provider about genetic screening.  If you are at high risk for breast cancer, talk to your health care provider about having an MRI and a mammogram every year.  Breast cancer gene (BRCA) assessment is recommended for women who have family members with BRCA-related cancers. BRCA-related cancers  include: ? Breast. ? Ovarian. ? Tubal. ? Peritoneal cancers.  Results of the assessment will determine the need for genetic counseling and BRCA1 and BRCA2 testing. Cervical Cancer Your health care provider may recommend that you be screened regularly for cancer of the pelvic organs (ovaries, uterus, and vagina). This screening involves a pelvic examination, including checking for microscopic changes to the surface of your cervix (Pap test). You may be encouraged to have this screening done every 3 years, beginning at age 21.  For women ages 30-65, health care providers may recommend pelvic exams and Pap testing every 3 years, or they may recommend the Pap and pelvic exam, combined with testing for human papilloma virus (HPV), every 5 years. Some types of HPV increase your risk of cervical cancer. Testing for HPV may also be done on women of any age with unclear Pap test results.  Other health care providers may not recommend any screening for nonpregnant women who are considered low risk for pelvic cancer and who do not have symptoms. Ask your health care provider if a screening pelvic exam is right for you.  If you have had past treatment for cervical cancer or a condition that could lead to cancer, you need Pap tests and screening for cancer for at least 20 years after your treatment. If Pap tests have been discontinued, your risk factors (such as having a new sexual partner) need to be reassessed to determine if screening should resume. Some women have medical problems that increase the chance of getting cervical cancer. In these cases, your health care provider may recommend more frequent screening and Pap tests. Colorectal Cancer  This type of cancer can be detected and often prevented.  Routine colorectal cancer screening usually begins at 83 years of age and continues through 83 years of age.  Your health care provider may recommend screening at an earlier age if you have risk factors for  colon cancer.  Your health care provider may also recommend using home test kits to check for hidden blood in the stool.  A small camera at the end of a tube can be used to examine your colon directly (sigmoidoscopy or colonoscopy). This is done to check for the earliest forms of colorectal cancer.  Routine screening usually begins at age 50.  Direct examination of the colon should be repeated every 5-10 years through 83 years of age. However, you may need to be screened more often if early forms of precancerous polyps or small growths are found. Skin Cancer  Check your skin from head to toe regularly.  Tell your health care provider about any new moles or changes in moles, especially if there is a change in a mole's shape or color.  Also tell your health care provider if you have a mole that is larger than the size of a pencil eraser.  Always use sunscreen. Apply sunscreen liberally and repeatedly throughout the day.  Protect yourself by wearing long sleeves, pants, a wide-brimmed hat, and sunglasses whenever you are outside. Heart disease, diabetes,   and high blood pressure  High blood pressure causes heart disease and increases the risk of stroke. High blood pressure is more likely to develop in: ? People who have blood pressure in the high end of the normal range (130-139/85-89 mm Hg). ? People who are overweight or obese. ? People who are African American.  If you are 18-39 years of age, have your blood pressure checked every 3-5 years. If you are 40 years of age or older, have your blood pressure checked every year. You should have your blood pressure measured twice-once when you are at a hospital or clinic, and once when you are not at a hospital or clinic. Record the average of the two measurements. To check your blood pressure when you are not at a hospital or clinic, you can use: ? An automated blood pressure machine at a pharmacy. ? A home blood pressure monitor.  If you are  between 55 years and 79 years old, ask your health care provider if you should take aspirin to prevent strokes.  Have regular diabetes screenings. This involves taking a blood sample to check your fasting blood sugar level. ? If you are at a normal weight and have a low risk for diabetes, have this test once every three years after 83 years of age. ? If you are overweight and have a high risk for diabetes, consider being tested at a younger age or more often. Preventing infection Hepatitis B  If you have a higher risk for hepatitis B, you should be screened for this virus. You are considered at high risk for hepatitis B if: ? You were born in a country where hepatitis B is common. Ask your health care provider which countries are considered high risk. ? Your parents were born in a high-risk country, and you have not been immunized against hepatitis B (hepatitis B vaccine). ? You have HIV or AIDS. ? You use needles to inject street drugs. ? You live with someone who has hepatitis B. ? You have had sex with someone who has hepatitis B. ? You get hemodialysis treatment. ? You take certain medicines for conditions, including cancer, organ transplantation, and autoimmune conditions. Hepatitis C  Blood testing is recommended for: ? Everyone born from 1945 through 1965. ? Anyone with known risk factors for hepatitis C. Sexually transmitted infections (STIs)  You should be screened for sexually transmitted infections (STIs) including gonorrhea and chlamydia if: ? You are sexually active and are younger than 83 years of age. ? You are older than 83 years of age and your health care provider tells you that you are at risk for this type of infection. ? Your sexual activity has changed since you were last screened and you are at an increased risk for chlamydia or gonorrhea. Ask your health care provider if you are at risk.  If you do not have HIV, but are at risk, it may be recommended that you take  a prescription medicine daily to prevent HIV infection. This is called pre-exposure prophylaxis (PrEP). You are considered at risk if: ? You are sexually active and do not regularly use condoms or know the HIV status of your partner(s). ? You take drugs by injection. ? You are sexually active with a partner who has HIV. Talk with your health care provider about whether you are at high risk of being infected with HIV. If you choose to begin PrEP, you should first be tested for HIV. You should then be tested every   3 months for as long as you are taking PrEP. Pregnancy  If you are premenopausal and you may become pregnant, ask your health care provider about preconception counseling.  If you may become pregnant, take 400 to 800 micrograms (mcg) of folic acid every day.  If you want to prevent pregnancy, talk to your health care provider about birth control (contraception). Osteoporosis and menopause  Osteoporosis is a disease in which the bones lose minerals and strength with aging. This can result in serious bone fractures. Your risk for osteoporosis can be identified using a bone density scan.  If you are 65 years of age or older, or if you are at risk for osteoporosis and fractures, ask your health care provider if you should be screened.  Ask your health care provider whether you should take a calcium or vitamin D supplement to lower your risk for osteoporosis.  Menopause may have certain physical symptoms and risks.  Hormone replacement therapy may reduce some of these symptoms and risks. Talk to your health care provider about whether hormone replacement therapy is right for you. Follow these instructions at home:  Schedule regular health, dental, and eye exams.  Stay current with your immunizations.  Do not use any tobacco products including cigarettes, chewing tobacco, or electronic cigarettes.  If you are pregnant, do not drink alcohol.  If you are breastfeeding, limit how  much and how often you drink alcohol.  Limit alcohol intake to no more than 1 drink per day for nonpregnant women. One drink equals 12 ounces of beer, 5 ounces of [place], or 1 ounces of hard liquor.  Do not use street drugs.  Do not share needles.  Ask your health care provider for help if you need support or information about quitting drugs.  Tell your health care provider if you often feel depressed.  Tell your health care provider if you have ever been abused or do not feel safe at home. This information is not intended to replace advice given to you by your health care provider. Make sure you discuss any questions you have with your health care provider. Document Released: 12/16/2010 Document Revised: 11/08/2015 Document Reviewed: 03/06/2015 [employer]vier Interactive Patient Education  2019 [employer]  

## 2018-09-27 NOTE — Patient Instructions (Signed)
Continue doing brain stimulating activities (puzzles, reading, adult coloring books, staying active) to keep memory sharp.   Continue to eat heart healthy diet (full of fruits, vegetables, whole grains, lean protein, water--limit salt, fat, and sugar intake) and increase physical activity as tolerated.  Health Maintenance, Female Adopting a healthy lifestyle and getting preventive care can go a long way to promote health and wellness. Talk with your health care provider about what schedule of regular examinations is right for you. This is a good chance for you to check in with your provider about disease prevention and staying healthy. In between checkups, there are plenty of things you can do on your own. Experts have done a lot of research about which lifestyle changes and preventive measures are most likely to keep you healthy. Ask your health care provider for more information. Weight and diet Eat a healthy diet  Be sure to include plenty of vegetables, fruits, low-fat dairy products, and lean protein.  Do not eat a lot of foods high in solid fats, added sugars, or salt.  Get regular exercise. This is one of the most important things you can do for your health. ? Most adults should exercise for at least 150 minutes each week. The exercise should increase your heart rate and make you sweat (moderate-intensity exercise). ? Most adults should also do strengthening exercises at least twice a week. This is in addition to the moderate-intensity exercise. Maintain a healthy weight  Body mass index (BMI) is a measurement that can be used to identify possible weight problems. It estimates body fat based on height and weight. Your health care provider can help determine your BMI and help you achieve or maintain a healthy weight.  For females 20 years of age and older: ? A BMI below 18.5 is considered underweight. ? A BMI of 18.5 to 24.9 is normal. ? A BMI of 25 to 29.9 is considered  overweight. ? A BMI of 30 and above is considered obese. Watch levels of cholesterol and blood lipids  You should start having your blood tested for lipids and cholesterol at 83 years of age, then have this test every 5 years.  You may need to have your cholesterol levels checked more often if: ? Your lipid or cholesterol levels are high. ? You are older than 83 years of age. ? You are at high risk for heart disease. Cancer screening Lung Cancer  Lung cancer screening is recommended for adults 55-80 years old who are at high risk for lung cancer because of a history of smoking.  A yearly low-dose CT scan of the lungs is recommended for people who: ? Currently smoke. ? Have quit within the past 15 years. ? Have at least a 30-pack-year history of smoking. A pack year is smoking an average of one pack of cigarettes a day for 1 year.  Yearly screening should continue until it has been 15 years since you quit.  Yearly screening should stop if you develop a health problem that would prevent you from having lung cancer treatment. Breast Cancer  Practice breast self-awareness. This means understanding how your breasts normally appear and feel.  It also means doing regular breast self-exams. Let your health care provider know about any changes, no matter how small.  If you are in your 20s or 30s, you should have a clinical breast exam (CBE) by a health care provider every 1-3 years as part of a regular health exam.  If you are 40 or   older, have a CBE every year. Also consider having a breast X-ray (mammogram) every year.  If you have a family history of breast cancer, talk to your health care provider about genetic screening.  If you are at high risk for breast cancer, talk to your health care provider about having an MRI and a mammogram every year.  Breast cancer gene (BRCA) assessment is recommended for women who have family members with BRCA-related cancers. BRCA-related cancers  include: ? Breast. ? Ovarian. ? Tubal. ? Peritoneal cancers.  Results of the assessment will determine the need for genetic counseling and BRCA1 and BRCA2 testing. Cervical Cancer Your health care provider may recommend that you be screened regularly for cancer of the pelvic organs (ovaries, uterus, and vagina). This screening involves a pelvic examination, including checking for microscopic changes to the surface of your cervix (Pap test). You may be encouraged to have this screening done every 3 years, beginning at age 21.  For women ages 30-65, health care providers may recommend pelvic exams and Pap testing every 3 years, or they may recommend the Pap and pelvic exam, combined with testing for human papilloma virus (HPV), every 5 years. Some types of HPV increase your risk of cervical cancer. Testing for HPV may also be done on women of any age with unclear Pap test results.  Other health care providers may not recommend any screening for nonpregnant women who are considered low risk for pelvic cancer and who do not have symptoms. Ask your health care provider if a screening pelvic exam is right for you.  If you have had past treatment for cervical cancer or a condition that could lead to cancer, you need Pap tests and screening for cancer for at least 20 years after your treatment. If Pap tests have been discontinued, your risk factors (such as having a new sexual partner) need to be reassessed to determine if screening should resume. Some women have medical problems that increase the chance of getting cervical cancer. In these cases, your health care provider may recommend more frequent screening and Pap tests. Colorectal Cancer  This type of cancer can be detected and often prevented.  Routine colorectal cancer screening usually begins at 83 years of age and continues through 83 years of age.  Your health care provider may recommend screening at an earlier age if you have risk factors for  colon cancer.  Your health care provider may also recommend using home test kits to check for hidden blood in the stool.  A small camera at the end of a tube can be used to examine your colon directly (sigmoidoscopy or colonoscopy). This is done to check for the earliest forms of colorectal cancer.  Routine screening usually begins at age 50.  Direct examination of the colon should be repeated every 5-10 years through 83 years of age. However, you may need to be screened more often if early forms of precancerous polyps or small growths are found. Skin Cancer  Check your skin from head to toe regularly.  Tell your health care provider about any new moles or changes in moles, especially if there is a change in a mole's shape or color.  Also tell your health care provider if you have a mole that is larger than the size of a pencil eraser.  Always use sunscreen. Apply sunscreen liberally and repeatedly throughout the day.  Protect yourself by wearing long sleeves, pants, a wide-brimmed hat, and sunglasses whenever you are outside. Heart disease, diabetes,   and high blood pressure  High blood pressure causes heart disease and increases the risk of stroke. High blood pressure is more likely to develop in: ? People who have blood pressure in the high end of the normal range (130-139/85-89 mm Hg). ? People who are overweight or obese. ? People who are African American.  If you are 18-39 years of age, have your blood pressure checked every 3-5 years. If you are 40 years of age or older, have your blood pressure checked every year. You should have your blood pressure measured twice-once when you are at a hospital or clinic, and once when you are not at a hospital or clinic. Record the average of the two measurements. To check your blood pressure when you are not at a hospital or clinic, you can use: ? An automated blood pressure machine at a pharmacy. ? A home blood pressure monitor.  If you are  between 55 years and 79 years old, ask your health care provider if you should take aspirin to prevent strokes.  Have regular diabetes screenings. This involves taking a blood sample to check your fasting blood sugar level. ? If you are at a normal weight and have a low risk for diabetes, have this test once every three years after 83 years of age. ? If you are overweight and have a high risk for diabetes, consider being tested at a younger age or more often. Preventing infection Hepatitis B  If you have a higher risk for hepatitis B, you should be screened for this virus. You are considered at high risk for hepatitis B if: ? You were born in a country where hepatitis B is common. Ask your health care provider which countries are considered high risk. ? Your parents were born in a high-risk country, and you have not been immunized against hepatitis B (hepatitis B vaccine). ? You have HIV or AIDS. ? You use needles to inject street drugs. ? You live with someone who has hepatitis B. ? You have had sex with someone who has hepatitis B. ? You get hemodialysis treatment. ? You take certain medicines for conditions, including cancer, organ transplantation, and autoimmune conditions. Hepatitis C  Blood testing is recommended for: ? Everyone born from 1945 through 1965. ? Anyone with known risk factors for hepatitis C. Sexually transmitted infections (STIs)  You should be screened for sexually transmitted infections (STIs) including gonorrhea and chlamydia if: ? You are sexually active and are younger than 83 years of age. ? You are older than 83 years of age and your health care provider tells you that you are at risk for this type of infection. ? Your sexual activity has changed since you were last screened and you are at an increased risk for chlamydia or gonorrhea. Ask your health care provider if you are at risk.  If you do not have HIV, but are at risk, it may be recommended that you take  a prescription medicine daily to prevent HIV infection. This is called pre-exposure prophylaxis (PrEP). You are considered at risk if: ? You are sexually active and do not regularly use condoms or know the HIV status of your partner(s). ? You take drugs by injection. ? You are sexually active with a partner who has HIV. Talk with your health care provider about whether you are at high risk of being infected with HIV. If you choose to begin PrEP, you should first be tested for HIV. You should then be tested every   3 months for as long as you are taking PrEP. Pregnancy  If you are premenopausal and you may become pregnant, ask your health care provider about preconception counseling.  If you may become pregnant, take 400 to 800 micrograms (mcg) of folic acid every day.  If you want to prevent pregnancy, talk to your health care provider about birth control (contraception). Osteoporosis and menopause  Osteoporosis is a disease in which the bones lose minerals and strength with aging. This can result in serious bone fractures. Your risk for osteoporosis can be identified using a bone density scan.  If you are 65 years of age or older, or if you are at risk for osteoporosis and fractures, ask your health care provider if you should be screened.  Ask your health care provider whether you should take a calcium or vitamin D supplement to lower your risk for osteoporosis.  Menopause may have certain physical symptoms and risks.  Hormone replacement therapy may reduce some of these symptoms and risks. Talk to your health care provider about whether hormone replacement therapy is right for you. Follow these instructions at home:  Schedule regular health, dental, and eye exams.  Stay current with your immunizations.  Do not use any tobacco products including cigarettes, chewing tobacco, or electronic cigarettes.  If you are pregnant, do not drink alcohol.  If you are breastfeeding, limit how  much and how often you drink alcohol.  Limit alcohol intake to no more than 1 drink per day for nonpregnant women. One drink equals 12 ounces of beer, 5 ounces of , or 1 ounces of hard liquor.  Do not use street drugs.  Do not share needles.  Ask your health care provider for help if you need support or information about quitting drugs.  Tell your health care provider if you often feel depressed.  Tell your health care provider if you have ever been abused or do not feel safe at home. This information is not intended to replace advice given to you by your health care provider. Make sure you discuss any questions you have with your health care provider. Document Released: 12/16/2010 Document Revised: 11/08/2015 Document Reviewed: 03/06/2015 Elsevier Interactive Patient Education  2019 Elsevier Inc.  

## 2018-09-27 NOTE — Progress Notes (Addendum)
Subjective:   Haley Mcmillan is a 85 y.o. female who presents for Medicare Annual (Subsequent) preventive examination.  I connected with patient 09/27/18 at  2:00 PM EDT by a video enabled telemedicine application and verified that I am speaking with the correct person using two identifiers.  Review of Systems:  No ROS.  Medicare Wellness Visit. Additional risk factors are reflected in the social history.  Cardiac Risk Factors include: advanced age (>1men, >50 women);dyslipidemia;hypertension Sleep patterns: no sleep issues, feels rested on waking, gets up 0-1 times nightly to void and sleeps 7-8 hours nightly.    Home Safety/Smoke Alarms: Feels safe in home. Smoke alarms in place.  Living environment; residence and Firearm Safety: 1-story house/ trailer. Lives with husband, no needs for DME, good support system Seat Belt Safety/Bike Helmet: Wears seat belt.      Objective:     Vitals: There were no vitals taken for this visit.  There is no height or weight on file to calculate BMI.  Advanced Directives 09/27/2018 02/27/2018 11/04/2017 09/09/2017 09/25/2016 07/21/2016 04/21/2016  Does Patient Have a Medical Advance Directive? No No No No Yes No No  Type of Advance Directive - - - - Press photographer;Living will - -  Does patient want to make changes to medical advance directive? - - - - Yes (MAU/Ambulatory/Procedural Areas - Information given) - -  Copy of [place] in Chart? - - - - - - -  Would patient like information on creating a medical advance directive? Yes (ED - Information included in AVS) No - Patient declined - - - - -    Tobacco Social History   Tobacco Use  Smoking Status Former Smoker  . Packs/day: 0.50  . Years: 50.00  . Pack years: 25.00  . Types: Cigarettes  . Last attempt to quit: 10/31/1994  . Years since quitting: 23.9  Smokeless Tobacco Never Used  Tobacco Comment   SMOKED SOME; QUIT 1996;      Counseling given: Not  Answered Comment: SMOKED SOME; QUIT 1996;      Past Medical History:  Diagnosis Date  . Anemia   . Arthritis   . Colon polyps   . GIST DX'D  03/2016  . Olecranon bursitis    Elbow  . Other and unspecified hyperlipidemia   . Palpitations   . Unspecified essential hypertension    Past Surgical History:  Procedure Laterality Date  . COLONOSCOPY W/ POLYPECTOMY  01/2004  . CRYOTHERAPY     female-cryosurgery stated by pt  . ESOPHAGOGASTRODUODENOSCOPY N/A 04/09/2016   Procedure: ESOPHAGOGASTRODUODENOSCOPY (EGD);  Surgeon: Doran Stabler, MD;  Location: [facility] ENDOSCOPY;  Service: Endoscopy;  Laterality: N/A;  . POLYPECTOMY     from vocal surgery   Family History  Problem Relation Age of Onset  . Gallstones Mother   . Aneurysm Father        brain  . Heart disease Father        CAD  . Breast cancer Sister   . Breast cancer Sister   . Heart attack Brother   . Cancer Brother   . Lung cancer Son   . Diabetes Neg Hx    Social History   Socioeconomic History  . Marital status: Married    Spouse name: Not on file  . Number of children: 1  . Years of education: Not on file  . Highest education level: Not on file  Occupational History  . Occupation: retired  Employer: RETIRED  Social Needs  . Financial resource strain: Not hard at all  . Food insecurity:    Worry: Never true    Inability: Never true  . Transportation needs:    Medical: No    Non-medical: No  Tobacco Use  . Smoking status: Former Smoker    Packs/day: 0.50    Years: 50.00    Pack years: 25.00    Types: Cigarettes    Last attempt to quit: 10/31/1994    Years since quitting: 23.9  . Smokeless tobacco: Never Used  . Tobacco comment: SMOKED SOME; QUIT 1996;   Substance and Sexual Activity  . Alcohol use: Yes    Alcohol/week: 0.0 standard drinks    Comment: wine  . Drug use: No  . Sexual activity: Not Currently  Lifestyle  . Physical activity:    Days per week: 5 days    Minutes per session: 30  min  . Stress: Not at all  Relationships  . Social connections:    Talks on phone: More than three times a week    Gets together: More than three times a week    Attends religious service: 1 to 4 times per year    Active member of club or organization: Yes    Attends meetings of clubs or organizations: More than 4 times per year    Relationship status: Married  Other Topics Concern  . 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          Outpatient Encounter Medications as of 09/27/2018  Medication Sig  . amLODipine (NORVASC) 5 MG tablet TAKE 1 TABLET(5 MG) BY MOUTH DAILY  . Blood Pressure Monitoring (BLOOD PRESSURE CUFF) MISC Measure BP daily  Dx: hypertension  . Cholecalciferol (VITAMIN D3) 5000 units CAPS Take 5,000 Units by mouth daily.   . Cyanocobalamin (VITAMIN B 12 PO) Take 1 tablet by mouth daily.  Marland Kitchen imatinib (GLEEVEC) 400 MG tablet Take 1 tablet (400 mg total) by mouth daily. Take with meals and large glass of water.Caution:Chemotherapy.  Marland Kitchen lisinopril (PRINIVIL,ZESTRIL) 20 MG tablet Take 1 tablet (20 mg total) by mouth daily.   No facility-administered encounter medications on file as of 09/27/2018.     Activities of Daily Living In your present state of health, do you have any difficulty performing the following activities: 09/27/2018  Hearing? N  Vision? N  Difficulty concentrating or making decisions? N  Walking or climbing stairs? N  Dressing or bathing? N  Doing errands, shopping? N  Preparing Food and eating ? N  Using the Toilet? N  In the past six months, have you accidently leaked urine? N  Do you have problems with loss of bowel control? N  Managing your Medications? N  Managing your Finances? N  Housekeeping or  managing your Housekeeping? N  Some recent data might be hidden    Patient Care Team: Hoyt Koch, MD as PCP - General (Internal Medicine) Loletha Carrow, Kirke Corin, MD as Consulting Physician (Gastroenterology) Truitt Merle, MD as Consulting Physician (Hematology)    Assessment:   This is a routine wellness examination for Yetzali. Physical assessment deferred to PCP.  Exercise Activities and Dietary  recommendations Current Exercise Habits: Home exercise routine;Structured exercise class(bowling league), Type of exercise: walking(bowling league), Time (Minutes): 30, Frequency (Times/Week): 7, Weekly Exercise (Minutes/Week): 210, Intensity: Mild, Exercise limited by: None identified  Diet (meal preparation, eat out, water intake, caffeinated beverages, dairy products, fruits and vegetables): in general, a "healthy" diet  , well balanced.  Reviewed heart healthy diet. Encouraged patient to increase daily water and healthy fluid intake.  Goals    . Have 3 meals a day     Start buying groceries and start cooking again;     . Patient Stated     Stay as healthy and as independent as possible.       Fall Risk Fall Risk  09/27/2018 03/19/2018 03/17/2017 05/17/2015 04/24/2015  Falls in the past year? 0 No No No No  Number falls in past yr: 0 - - - -    Depression Screen PHQ 2/9 Scores 09/27/2018 03/19/2018 03/17/2017 05/17/2015  PHQ - 2 Score 0 0 0 0     Cognitive Function MMSE - Mini Mental State Exam 05/17/2015  Not completed: (No Data)       Ad8 score reviewed for issues:  Issues making decisions: no  Less interest in hobbies / activities: no  Repeats questions, stories (family complaining): no  Trouble using ordinary gadgets (microwave, computer, phone):no  Forgets the month or year: no  Mismanaging finances: no  Remembering appts: no  Daily problems with thinking and/or memory: no Ad8 score is= 0  Immunization History  Administered Date(s) Administered  .  Influenza, High Dose Seasonal PF 03/17/2017  . Influenza,inj,Quad PF,6+ Mos 05/12/2016  . Pneumococcal Conjugate-13 05/23/2015  . Pneumococcal Polysaccharide-23 12/22/2012  . Tetanus 12/22/2012   Screening Tests Health Maintenance  Topic Date Due  . INFLUENZA VACCINE  01/15/2019  . TETANUS/TDAP  12/23/2022  . DEXA SCAN  Completed  . PNA vac Low Risk Adult  Completed        Plan:      I have personally reviewed and noted the following in the patient's chart:   . Medical and social history . Use of alcohol, tobacco or illicit drugs  . Current medications and supplements . Functional ability and status . Nutritional status . Physical activity . Advanced directives . List of other physicians . Vitals . Screenings to include cognitive, depression, and falls . Referrals and appointments  In addition, I have reviewed and discussed with patient certain preventive protocols, quality metrics, and best practice recommendations. A written personalized care plan for preventive services as well as general preventive health recommendations were provided to patient.     Michiel Cowboy, RN  09/27/2018   Medical screening examination/treatment/procedure(s) were performed by non-physician practitioner and as supervising provider I was immediately available for consultation/collaboration.  I agree with above. Marrian Salvage, FNP

## 2018-09-28 ENCOUNTER — Inpatient Hospital Stay: Payer: Medicare Other | Attending: Nurse Practitioner

## 2018-09-28 ENCOUNTER — Ambulatory Visit (HOSPITAL_COMMUNITY)
Admission: RE | Admit: 2018-09-28 | Discharge: 2018-09-28 | Disposition: A | Discharge status: Home / Self Care | Payer: Medicare Other | Source: Ambulatory Visit | Attending: Hematology | Admitting: Hematology

## 2018-09-28 ENCOUNTER — Telehealth: Payer: Self-pay | Admitting: *Deleted

## 2018-09-28 ENCOUNTER — Encounter (HOSPITAL_COMMUNITY): Payer: Self-pay

## 2018-09-28 ENCOUNTER — Other Ambulatory Visit: Payer: Self-pay

## 2018-09-28 DIAGNOSIS — C787 Secondary malignant neoplasm of liver and intrahepatic bile duct: Secondary | ICD-10-CM | POA: Diagnosis not present

## 2018-09-28 DIAGNOSIS — I129 Hypertensive chronic kidney disease with stage 1 through stage 4 chronic kidney disease, or unspecified chronic kidney disease: Secondary | ICD-10-CM | POA: Insufficient documentation

## 2018-09-28 DIAGNOSIS — N183 Chronic kidney disease, stage 3 (moderate): Secondary | ICD-10-CM | POA: Diagnosis not present

## 2018-09-28 DIAGNOSIS — C49A2 Gastrointestinal stromal tumor of stomach: Secondary | ICD-10-CM | POA: Diagnosis present

## 2018-09-28 DIAGNOSIS — R918 Other nonspecific abnormal finding of lung field: Secondary | ICD-10-CM | POA: Insufficient documentation

## 2018-09-28 DIAGNOSIS — Z79899 Other long term (current) drug therapy: Secondary | ICD-10-CM | POA: Insufficient documentation

## 2018-09-28 DIAGNOSIS — C786 Secondary malignant neoplasm of retroperitoneum and peritoneum: Secondary | ICD-10-CM | POA: Insufficient documentation

## 2018-09-28 DIAGNOSIS — D509 Iron deficiency anemia, unspecified: Secondary | ICD-10-CM | POA: Insufficient documentation

## 2018-09-28 LAB — CBC WITH DIFFERENTIAL/PLATELET
Abs Immature Granulocytes: 0.01 10*3/uL (ref 0.00–0.07)
Basophils Absolute: 0.1 10*3/uL (ref 0.0–0.1)
Basophils Relative: 1 %
Eosinophils Absolute: 0.2 10*3/uL (ref 0.0–0.5)
Eosinophils Relative: 4 %
HCT: 36.6 % (ref 36.0–46.0)
Hemoglobin: 12.2 g/dL (ref 12.0–15.0)
Immature Granulocytes: 0 %
Lymphocytes Relative: 35 %
Lymphs Abs: 1.6 10*3/uL (ref 0.7–4.0)
MCH: 35.7 pg — ABNORMAL HIGH (ref 26.0–34.0)
MCHC: 33.3 g/dL (ref 30.0–36.0)
MCV: 107 fL — ABNORMAL HIGH (ref 80.0–100.0)
Monocytes Absolute: 0.4 10*3/uL (ref 0.1–1.0)
Monocytes Relative: 9 %
Neutro Abs: 2.4 10*3/uL (ref 1.7–7.7)
Neutrophils Relative %: 51 %
Platelets: 151 10*3/uL (ref 150–400)
RBC: 3.42 MIL/uL — ABNORMAL LOW (ref 3.87–5.11)
RDW: 12.7 % (ref 11.5–15.5)
WBC: 4.6 10*3/uL (ref 4.0–10.5)
nRBC: 0 % (ref 0.0–0.2)

## 2018-09-28 LAB — COMPREHENSIVE METABOLIC PANEL
ALT: 8 U/L (ref 0–44)
AST: 19 U/L (ref 15–41)
Albumin: 4 g/dL (ref 3.5–5.0)
Alkaline Phosphatase: 62 U/L (ref 38–126)
Anion gap: 12 (ref 5–15)
BUN: 10 mg/dL (ref 8–23)
CO2: 23 mmol/L (ref 22–32)
Calcium: 8.9 mg/dL (ref 8.9–10.3)
Chloride: 110 mmol/L (ref 98–111)
Creatinine, Ser: 1.22 mg/dL — ABNORMAL HIGH (ref 0.44–1.00)
GFR calc Af Amer: 48 mL/min — ABNORMAL LOW (ref >60)
GFR calc non Af Amer: 41 mL/min — ABNORMAL LOW (ref >60)
Glucose, Bld: 89 mg/dL (ref 70–99)
Potassium: 3.6 mmol/L (ref 3.5–5.1)
Sodium: 145 mmol/L (ref 135–145)
Total Bilirubin: 0.3 mg/dL (ref 0.3–1.2)
Total Protein: 7.4 g/dL (ref 6.5–8.1)

## 2018-09-28 NOTE — Telephone Encounter (Signed)
Received call from patient to confirm appt for Thursday , 09/30/18. Pt does not want a webex call or phone call. Made scheduling aware.

## 2018-09-29 MED FILL — IMATINIB MESYLATE 400 MG TA: 400 | 30 days supply | Qty: 30 | Fill #2

## 2018-09-29 NOTE — Progress Notes (Signed)
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    04/09/2016 Initial Biopsy    Gastric mass biopsy showed gastrointestinal stromal tumor, I feel mitosis and necrosis, intermediate to high-grade.    04/09/2016 Imaging    CT chest, abdomen and pelvis with contrast showed a large exophytic mass in the proximal lateral stomach measuring 10 x 7.4 cm, nodular lesions within omentum and multiple liver nodules are highly suspicious for metastatic disease.    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 Today's study demonstrates a positive response to therapy with regression of the primary gastric neoplasm, as well as regression of numerous intraperitoneal metastatic lesions.  Additionally, the liver lesions seen on the prior study appear far more well-defined, decreased in size and are much lower attenuation, suggesting internal areas of necrosis. 2. Several small pulmonary nodules are noted, some of which are new compared to the prior examination. These are nonspecific, but metastatic disease to the lungs is not excluded. Attention on follow-up imaging is recommended. 3. Importantly, there is also widespread ground-glass attenuation and septal thickening in the lungs bilaterally which is new compared to the prior study. In the appropriate clinical setting, this could reflect a drug reaction. Other differential considerations include atypical infection, or even developing interstitial lung disease. Clinical correlation is suggested. 4. Aortic atherosclerosis, in addition to right coronary artery disease. 5. Mild diffuse bronchial wall thickening with mild centrilobular and paraseptal emphysema; imaging findings suggestive of underlying COPD. 6. Additional incidental findings, as above.    09/22/2016 Imaging     CT CAP W CONTRAST   IMPRESSION: 1. Nodular appearing GI stromal tumor along the posterior gastric wall with associated peritoneal implants and hepatic metastases, stable from 07/16/2016. 2. Improving mid and lower lung zone predominant peribronchovascular ground-glass, possibly related to drug therapy. 3. A few scattered pulmonary nodules are stable. 4. Aortic atherosclerosis (ICD10-170.0). Coronary artery calcification.    01/14/2017 Imaging    CT CAP w Contrast  IMPRESSION: 1. Further decrease in size of large GI stromal tumor arising from the greater curvature of the stomach with improvement in hepatic and multifocal peritoneal metastases. 2. Further improvement in lower lung predominant peribronchovascular ground-glass density, probably inflammatory (drug reaction or or infectious). 3. No  acute findings. 4.  Aortic Atherosclerosis  (ICD10-I70.0).    05/08/2017 Imaging    IMPRESSION: 1. Overall, today's examination is very similar to recent prior studies again demonstrating a large proximal gastric mass with adjacent gastrosplenic ligament lymphadenopathy, multiple treated lesions in the liver, and other intraperitoneal metastases, as discussed above. No definite new sites of metastatic disease are otherwise noted. 2. Aortic atherosclerosis, in addition to right coronary artery disease. 3. Mild cardiomegaly. 4. There are calcifications of the aortic valve. Echocardiographic correlation for evaluation of potential valvular dysfunction may be warranted if clinically indicated. 5. Additional incidental findings, as above. Aortic Atherosclerosis (ICD10-I70.0).    09/07/2017 Imaging    CT CAP W Contrast 09/07/17 IMPRESSION: 1. Large mass extending off the greater curvature of the stomach is similar to prior. Majority of nodularity within the adjacent mesentery is similar. There is one adjacent nodule which has increased in size when compared to prior exam. 2. Grossly unchanged treated hepatic lesions.    01/04/2018 Imaging    CT CAP WO COntrast 01/04/18 IMPRESSION: 1. No substantial interval change in exam. 2. Irregular mass at the proximal stomach is similar to prior. Scattered soft tissue lesions in the perigastric reason, omentum, and mesentery are similar to prior. No generalized trend towards worsening or improving disease. No new or progressive findings on today's study. 3. Stable appearance multiple hepatic lesions. 4.  Aortic Atherosclerois (ICD10-170.0)    05/05/2018 Imaging    CT CAP 05/05/18  IMPRESSION: 1. Mild response to therapy, as evidenced by decreased size of many soft tissue lesions along the greater curvature of the stomach, as well as within the omentum. 2. Similar hepatic metastasis. 3.  No acute process or evidence of metastatic disease in the chest. 4.  Aortic Atherosclerosis  (ICD10-I70.0). 5. Pulmonary artery enlargement suggests pulmonary arterial hypertension.    09/28/2018 Imaging    CT CAP 09/28/18  IMPRESSION: 1. No change in soft tissue nodularity about the greater curvature of the stomach and pancreatic tail (series 2, image 53).  2. No change in noncontrast appearance of numerous small hypodense metastatic lesions of the liver.  3. New 4 mm irregular pulmonary nodule of the right pulmonary apex (series 6, image 32), nonspecific. Attention on follow-up. Additional stable small pulmonary nodules and ground-glass opacities, for example 3 mm adjacent nodules of the right upper lobe (series 6, image 51).      CURRENT THERAPY:  Gleevec '400mg'$  daily starting 04/22/16  INTERVAL HISTORY:  Haley Mcmillan is here for a follow up of her GIST. She presents to the clinic today by herself. She notes she is doing well. She stays home mostly other than the store and doctor visits. She denies pain, abdominal issues, cough or SOB. She notes her appetite and energy is adequate. She will exercise by walking now that she is mostly at home.  She notes having a upper right back pain which resolved with drinking more water.    REVIEW OF SYSTEMS:   Constitutional: Denies fevers, chills or abnormal weight loss Eyes: Denies blurriness of vision Ears, nose, mouth, throat, and face: Denies mucositis or sore throat Respiratory: Denies cough, dyspnea or wheezes Cardiovascular: Denies palpitation, chest discomfort or lower extremity swelling Gastrointestinal:  Denies nausea, heartburn or change in bowel habits Skin: Denies abnormal skin rashes Lymphatics: Denies new lymphadenopathy or easy bruising Neurological:Denies numbness, tingling or new weaknesses Behavioral/Psych: Mood is stable, no new changes  All other systems were reviewed with the patient and are negative.  MEDICAL HISTORY:  Past Medical History:  Diagnosis Date  . Anemia   . Arthritis   . Colon  polyps   . GIST DX'D  03/2016  . Olecranon bursitis    Elbow  . Other and unspecified hyperlipidemia   . Palpitations   . Unspecified essential hypertension     SURGICAL HISTORY: Past Surgical History:  Procedure Laterality Date  . COLONOSCOPY W/ POLYPECTOMY  01/2004  . CRYOTHERAPY     female-cryosurgery stated by pt  . ESOPHAGOGASTRODUODENOSCOPY N/A 04/09/2016   Procedure: ESOPHAGOGASTRODUODENOSCOPY (EGD);  Surgeon: Doran Stabler, MD;  [facility] ENDOSCOPY;  Service: Endoscopy;  Laterality: N/A;  . POLYPECTOMY     from vocal surgery    I have reviewed the social history and family history with the patient and they are unchanged from previous note.  ALLERGIES:  has No Known Allergies.  MEDICATIONS:  Current Outpatient Medications  Medication Sig Dispense Refill  . amLODipine (NORVASC) 5 MG tablet TAKE 1 TABLET(5 MG) BY MOUTH DAILY 90 tablet 1  . Blood Pressure Monitoring (BLOOD PRESSURE CUFF) MISC Measure BP daily  Dx: hypertension 1 each 0  . Cholecalciferol (VITAMIN D3) 5000 units CAPS Take 5,000 Units by mouth daily.     . Cyanocobalamin (VITAMIN B 12 PO) Take 1 tablet by mouth daily.    Marland Kitchen imatinib (GLEEVEC) 400 MG tablet Take 1 tablet (400 mg total) by mouth daily. Take with meals and large glass of water.Caution:Chemotherapy. 30 tablet 5  . lisinopril (PRINIVIL,ZESTRIL) 20 MG tablet Take 1 tablet (20 mg total) by mouth daily. 90 tablet 1   No current facility-administered medications for this visit.     PHYSICAL EXAMINATION: ECOG PERFORMANCE STATUS: 0 - Asymptomatic  Vitals:   09/30/18 1404  BP: (!) 164/83  Pulse: 73  Resp: 20  Temp: 98.4 F (36.9 C)  SpO2: 100%   Filed Weights   09/30/18 1404  Weight: 164 lb 3.2 oz (74.5 kg)    GENERAL:alert, no distress and comfortable SKIN: skin color, texture, turgor are normal, no rashes or significant lesions EYES: normal, Conjunctiva are pink and non-injected, sclera clear LYMPH:  no palpable lymphadenopathy  in the cervical, axillary or inguinal LUNGS: clear to auscultation and percussion with normal breathing effort HEART: regular rate & rhythm and no murmurs and no lower extremity edema ABDOMEN:abdomen soft, non-tender and normal bowel sounds Musculoskeletal:no cyanosis of digits and no clubbing  NEURO: alert & oriented x 3 with fluent speech, no focal motor/sensory deficits  LABORATORY DATA:  I have reviewed the data as listed CBC Latest Ref Rng & Units 09/28/2018 07/30/2018 05/05/2018  WBC 4.0 - 10.5 K/uL 4.6 4.4 5.0  Hemoglobin 12.0 - 15.0 g/dL 12.2 12.6 12.3  Hematocrit 36.0 - 46.0 % 36.6 37.7 36.8  Platelets 150 - 400 K/uL 151 154 156     CMP Latest Ref Rng & Units 09/28/2018 07/30/2018 05/05/2018  Glucose 70 - 99 mg/dL 89 86 92  BUN 8 - 23 mg/dL '10 12 10  '$ Creatinine 0.44 - 1.00 mg/dL 1.22(H) 1.28(H) 1.37(H)  Sodium 135 - 145 mmol/L 145 143 145  Potassium 3.5 - 5.1 mmol/L 3.6 4.1 4.0  Chloride 98 - 111 mmol/L 110 109 110  CO2 22 - 32 mmol/L '23 26 25  '$ Calcium 8.9 - 10.3 mg/dL 8.9 9.2 9.2  Total Protein 6.5 - 8.1 g/dL 7.4 7.3 7.4  Total Bilirubin 0.3 - 1.2 mg/dL 0.3 0.5 0.4  Alkaline Phos 38 - 126 U/L 62 60 65  AST 15 -  41 U/L '19 20 21  '$ ALT 0 - 44 U/L '8 8 9      '$ RADIOGRAPHIC STUDIES: I have personally reviewed the radiological images as listed and agreed with the findings in the report. No results found.   ASSESSMENT & PLAN:  RYLEN HOU is a 85 y.o. female with   1. Malignant gastrointestinal stromal tumor (GIST) of stomach, with liver and peritoneal metastasis -Diagnosed in 03/2016 andhas beentreated with Tyronza '400mg'$  daily since 04/22/16.Toleratingwell.  -Her restaging scans have showed great response to Gleevec. -We discussed her CT CAP form 09/28/18 which showed stable disease. I have reviewed her scan images in person, her new pulmonary nodule is very small 85m, which is indeterminate, no highly suspicious for lung mets. Will continue monitoring. -Will  monitor with repeat CT chest in 3 months if COVID-19 pandemic resolves by then  -She is clinically doing well without symptoms. Labs reviewed from 09/28/18 WNL except MCV 107, cr at 1.22. I strongly encouraged her to drink more water.  -She will continue Gleevec, -F/u in 3 months due to COVID-19  2. Anemia, iron deficiency  -Continue oral ferous sulfate once daily.  -Currently resolved   3. HTN, uncontrolled -Currently on amlodipine 5 mg and lisinopril '20mg'$  daily, managed by PCP -BP at 164/83 (09/28/18)  4. Pulmonary nodules and groundglass change -noticed on previous CT scan, follow up scan showedstable pulmonary nodules, groundglass changes have resolved -Stable on 09/28/18 CT CAP   5. CKD, Stage III -Cr at 1.22 today (09/28/18), overall stable. -I advised her to avoid regular use of Advil or Aleve  -I encouraged her to drink more water  Plan -continue Gleevec, refilled today  -Lab and F/u in 3 months, may consider a CT chest wo contrast after next visit if COVID-19 pandemic resolves by then       No problem-specific Assessment & Plan notes found for this encounter.   No orders of the defined types were placed in this encounter.  All questions were answered. The patient knows to call the clinic with any problems, questions or concerns. No barriers to learning was detected.      YTruitt Merle MD 09/30/2018   I, AJoslyn Devon am acting as scribe for YTruitt Merle MD.   I have reviewed the above documentation for accuracy and completeness, and I agree with the above.

## 2018-09-30 ENCOUNTER — Inpatient Hospital Stay (HOSPITAL_BASED_OUTPATIENT_CLINIC_OR_DEPARTMENT_OTHER): Payer: Medicare Other | Admitting: Hematology

## 2018-09-30 ENCOUNTER — Encounter: Payer: Self-pay | Admitting: Hematology

## 2018-09-30 ENCOUNTER — Other Ambulatory Visit: Payer: Self-pay

## 2018-09-30 ENCOUNTER — Telehealth: Payer: Self-pay | Admitting: Hematology

## 2018-09-30 VITALS — BP 164/83 | HR 73 | Temp 98.4°F | Resp 20 | Ht 67.0 in | Wt 164.2 lb

## 2018-09-30 DIAGNOSIS — D509 Iron deficiency anemia, unspecified: Secondary | ICD-10-CM | POA: Diagnosis not present

## 2018-09-30 DIAGNOSIS — I129 Hypertensive chronic kidney disease with stage 1 through stage 4 chronic kidney disease, or unspecified chronic kidney disease: Secondary | ICD-10-CM

## 2018-09-30 DIAGNOSIS — C786 Secondary malignant neoplasm of retroperitoneum and peritoneum: Secondary | ICD-10-CM | POA: Diagnosis not present

## 2018-09-30 DIAGNOSIS — C787 Secondary malignant neoplasm of liver and intrahepatic bile duct: Secondary | ICD-10-CM | POA: Diagnosis not present

## 2018-09-30 DIAGNOSIS — R918 Other nonspecific abnormal finding of lung field: Secondary | ICD-10-CM

## 2018-09-30 DIAGNOSIS — C49A2 Gastrointestinal stromal tumor of stomach: Secondary | ICD-10-CM | POA: Diagnosis not present

## 2018-09-30 DIAGNOSIS — N183 Chronic kidney disease, stage 3 (moderate): Secondary | ICD-10-CM

## 2018-09-30 DIAGNOSIS — Z79899 Other long term (current) drug therapy: Secondary | ICD-10-CM

## 2018-09-30 MED ORDER — IMATINIB MESYLATE 400 MG PO TABS: 400.0000 mg | ORAL_TABLET | Freq: Every day | ORAL | 5 refills | Status: DC

## 2018-09-30 MED FILL — IMATINIB MESYLATE 400 MG TA: 400 | 30 days supply | Qty: 30 | Fill #0

## 2018-09-30 NOTE — Telephone Encounter (Signed)
Scheduled appt per 4/16 los. °

## 2018-10-01 ENCOUNTER — Other Ambulatory Visit: Payer: Self-pay | Admitting: Pharmacist

## 2018-10-01 ENCOUNTER — Encounter: Payer: Self-pay | Admitting: Hematology

## 2018-11-27 ENCOUNTER — Other Ambulatory Visit: Payer: Self-pay | Admitting: Internal Medicine

## 2018-11-29 MED FILL — IMATINIB MESYLATE 400 MG TA: 400 | 30 days supply | Qty: 30 | Fill #3

## 2018-12-27 NOTE — Progress Notes (Signed)
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   04/09/2016 Initial Biopsy   Gastric mass biopsy showed gastrointestinal stromal tumor, I feel mitosis and necrosis, intermediate to high-grade.   04/09/2016 Imaging   CT chest, abdomen and pelvis with contrast showed a large exophytic mass in the proximal lateral stomach measuring 10 x 7.4 cm, nodular lesions within omentum and multiple liver nodules are highly suspicious for metastatic disease.   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 Today's study demonstrates a positive response to therapy with regression of the primary gastric neoplasm, as well as regression of numerous intraperitoneal metastatic lesions. Additionally, the  liver lesions seen on the prior study appear far more well-defined, decreased in size and are much lower attenuation, suggesting internal areas of necrosis. 2. Several small pulmonary nodules are noted, some of which are new compared to the prior examination. These are nonspecific, but metastatic disease to the lungs is not excluded. Attention on follow-up imaging is recommended. 3. Importantly, there is also widespread ground-glass attenuation and septal thickening in the lungs bilaterally which is new compared to the prior study. In the appropriate clinical setting, this could reflect a drug reaction. Other differential considerations include atypical infection, or even developing interstitial lung disease. Clinical correlation is suggested. 4. Aortic atherosclerosis, in addition to right coronary artery disease. 5. Mild diffuse bronchial wall thickening with mild centrilobular and paraseptal emphysema; imaging findings suggestive of underlying COPD. 6. Additional incidental findings, as above.   09/22/2016 Imaging    CT CAP W CONTRAST   IMPRESSION: 1. Nodular appearing GI stromal tumor along the posterior gastric wall with associated peritoneal implants and hepatic metastases, stable from 07/16/2016. 2. Improving mid and lower lung zone predominant peribronchovascular ground-glass, possibly related to drug therapy. 3. A few scattered pulmonary nodules are stable. 4. Aortic atherosclerosis (ICD10-170.0). Coronary artery calcification.   01/14/2017 Imaging   CT CAP w Contrast  IMPRESSION: 1. Further decrease in size of large GI stromal tumor arising from the greater curvature of the stomach with improvement in hepatic and multifocal peritoneal metastases. 2. Further improvement in lower lung predominant peribronchovascular ground-glass density, probably inflammatory (drug reaction or or infectious). 3. No acute findings. 4.  Aortic Atherosclerosis (ICD10-I70.0).    05/08/2017 Imaging   IMPRESSION: 1. Overall, today's examination is very   similar to recent prior studies again demonstrating a large proximal gastric mass with adjacent gastrosplenic ligament lymphadenopathy, multiple treated lesions in the liver, and other intraperitoneal metastases, as discussed above. No definite new sites of metastatic disease are otherwise noted. 2. Aortic atherosclerosis, in addition to right coronary artery disease. 3. Mild cardiomegaly. 4. There are calcifications of the aortic valve. Echocardiographic correlation for evaluation of potential valvular dysfunction may be warranted if clinically indicated. 5. Additional incidental findings, as above. Aortic Atherosclerosis (ICD10-I70.0).   09/07/2017 Imaging   CT CAP W Contrast 09/07/17 IMPRESSION: 1. Large mass extending off the greater curvature of the stomach is similar to prior. Majority of nodularity within the adjacent mesentery is similar. There is one adjacent nodule which has increased in size when compared to prior exam. 2. Grossly unchanged treated hepatic lesions.   01/04/2018 Imaging   CT CAP WO COntrast 01/04/18 IMPRESSION: 1. No substantial interval change in exam. 2. Irregular mass at the proximal stomach is similar to prior. Scattered soft tissue lesions in the perigastric reason, omentum, and mesentery are similar to prior. No generalized trend towards worsening or improving disease. No new or progressive findings on today's study. 3. Stable appearance multiple hepatic lesions. 4.  Aortic Atherosclerois (ICD10-170.0)   05/05/2018 Imaging   CT CAP 05/05/18  IMPRESSION: 1. Mild response to therapy, as evidenced by decreased size of many soft tissue lesions along the greater curvature of the stomach, as well as within the omentum. 2. Similar hepatic metastasis. 3.  No acute process or evidence of metastatic disease in the chest. 4.  Aortic Atherosclerosis (ICD10-I70.0). 5. Pulmonary  artery enlargement suggests pulmonary arterial hypertension.   09/28/2018 Imaging   CT CAP 09/28/18  IMPRESSION: 1. No change in soft tissue nodularity about the greater curvature of the stomach and pancreatic tail (series 2, image 53).  2. No change in noncontrast appearance of numerous small hypodense metastatic lesions of the liver.  3. New 4 mm irregular pulmonary nodule of the right pulmonary apex (series 6, image 32), nonspecific. Attention on follow-up. Additional stable small pulmonary nodules and ground-glass opacities, for example 3 mm adjacent nodules of the right upper lobe (series 6, image 51).      CURRENT THERAPY:  Gleevec 400mg daily starting 04/22/16  INTERVAL HISTORY:  Haley Mcmillan is here for a follow up of her GIST. She presents to the clinic alone. She notes she is doing well. She notes her Gleevec pill size has gotten bigger. She is still tolerating it. She notes she has a BM 2-3 times a day lately. It has always been looser stool. She denies abdominal bloating or any pain. She is eating adequately. She wonders if her food is impacted her BMs. She has reduced to 2 meals and eat fruit in interim. She eats less because she has to cook it. She has been able to maintain her weight.     REVIEW OF SYSTEMS:   Constitutional: Denies fevers, chills or abnormal weight loss Eyes: Denies blurriness of vision Ears, nose, mouth, throat, and face: Denies mucositis or sore throat Respiratory: Denies cough, dyspnea or wheezes Cardiovascular: Denies palpitation, chest discomfort or lower extremity swelling Gastrointestinal:  Denies nausea, heartburn (+) More frequent BMs Skin: Denies abnormal skin rashes Lymphatics: Denies new lymphadenopathy or easy bruising Neurological:Denies numbness, tingling or new weaknesses Behavioral/Psych: Mood is stable, no new changes  All other systems were reviewed with the patient and are negative.  MEDICAL HISTORY:  Past Medical  History:  Diagnosis Date  .   Anemia   . Arthritis   . Colon polyps   . GIST DX'D  03/2016  . Olecranon bursitis    Elbow  . Other and unspecified hyperlipidemia   . Palpitations   . Unspecified essential hypertension     SURGICAL HISTORY: Past Surgical History:  Procedure Laterality Date  . COLONOSCOPY W/ POLYPECTOMY  01/2004  . CRYOTHERAPY     female-cryosurgery stated by pt  . ESOPHAGOGASTRODUODENOSCOPY N/A 04/09/2016   Procedure: ESOPHAGOGASTRODUODENOSCOPY (EGD);  Surgeon: Henry L Danis III, MD;  Location: [facility];  Service: Endoscopy;  Laterality: N/A;  . POLYPECTOMY     from vocal surgery    I have reviewed the social history and family history with the patient and they are unchanged from previous note.  ALLERGIES:  has No Known Allergies.  MEDICATIONS:  Current Outpatient Medications  Medication Sig Dispense Refill  . amLODipine (NORVASC) 5 MG tablet TAKE 1 TABLET(5 MG) BY MOUTH DAILY 90 tablet 1  . Blood Pressure Monitoring (BLOOD PRESSURE CUFF) MISC Measure BP daily  Dx: hypertension 1 each 0  . Cholecalciferol (VITAMIN D3) 5000 units CAPS Take 5,000 Units by mouth daily.     . Cyanocobalamin (VITAMIN B 12 PO) Take 1 tablet by mouth daily.    . imatinib (GLEEVEC) 400 MG tablet Take 1 tablet (400 mg total) by mouth daily. Take with meals and large glass of water.Caution:Chemotherapy. 30 tablet 5  . lisinopril (ZESTRIL) 20 MG tablet TAKE 1 TABLET(20 MG) BY MOUTH DAILY 90 tablet 1   No current facility-administered medications for this visit.     PHYSICAL EXAMINATION: ECOG PERFORMANCE STATUS: 1 - Symptomatic but completely ambulatory  Vitals:   12/30/18 1355  BP: (!) 158/63  Pulse: 70  Resp: 17  Temp: 98.9 F (37.2 C)  SpO2: 98%   Filed Weights   12/30/18 1355  Weight: 160 lb 12.8 oz (72.9 kg)    GENERAL:alert, no distress and comfortable SKIN: skin color, texture, turgor are normal, no rashes or significant lesions EYES: normal, Conjunctiva are  pink and non-injected, sclera clear  NECK: supple, thyroid normal size, non-tender, without nodularity LYMPH:  no palpable lymphadenopathy in the cervical, axillary  LUNGS: clear to auscultation and percussion with normal breathing effort HEART: regular rate & rhythm and no murmurs and no lower extremity edema ABDOMEN:abdomen soft, non-tender and normal bowel sounds Musculoskeletal:no cyanosis of digits and no clubbing  NEURO: alert & oriented x 3 with fluent speech, no focal motor/sensory deficits  LABORATORY DATA:  I have reviewed the data as listed CBC Latest Ref Rng & Units 12/30/2018 09/28/2018 07/30/2018  WBC 4.0 - 10.5 K/uL 4.0 4.6 4.4  Hemoglobin 12.0 - 15.0 g/dL 11.8(L) 12.2 12.6  Hematocrit 36.0 - 46.0 % 35.4(L) 36.6 37.7  Platelets 150 - 400 K/uL 155 151 154     CMP Latest Ref Rng & Units 12/30/2018 09/28/2018 07/30/2018  Glucose 70 - 99 mg/dL 82 89 86  BUN 8 - 23 mg/dL 11 10 12  Creatinine 0.44 - 1.00 mg/dL 1.41(H) 1.22(H) 1.28(H)  Sodium 135 - 145 mmol/L 142 145 143  Potassium 3.5 - 5.1 mmol/L 4.0 3.6 4.1  Chloride 98 - 111 mmol/L 110 110 109  CO2 22 - 32 mmol/L 25 23 26  Calcium 8.9 - 10.3 mg/dL 8.8(L) 8.9 9.2  Total Protein 6.5 - 8.1 g/dL 7.2 7.4 7.3  Total Bilirubin 0.3 - 1.2 mg/dL 0.4 0.3 0.5  Alkaline Phos 38 - 126 U/L 57 62 60  AST 15 -   41 U/L 20 19 20  ALT 0 - 44 U/L 8 8 8      RADIOGRAPHIC STUDIES: I have personally reviewed the radiological images as listed and agreed with the findings in the report. No results found.   ASSESSMENT & PLAN:  Haley Mcmillan is a 85 y.o. female with   1. Malignant gastrointestinal stromal tumor (GIST) of stomach, with liver and peritoneal metastasis -Diagnosed in 03/2016 andhas beentreated with Gleevec 400mg daily since 04/22/16.Toleratingwell.  -Her restaging scans have showed great response to Gleevec. -She is clinically doing well. She has more frequent BM daily. Gleevec can cause diarrhea. I instructed her to use  OTC imodium if she starts to have more than 3 BMs a day.  -Labs reviewed, CBC and CMP WNL except Hg 11.8, Cr 1.41. Physical exam unremarkable today.  -She will continue Gleevec -F/u in 3 months with CT CAP scan wo contrast   2. Anemia, iron deficiency  -Continue oral ferous sulfate once daily.  -Was previously resolved. Hg at 11.8 today (12/30/18). Likely related to Gleevec.   3. HTN, uncontrolled -Currently onamlodipine 5 mg and lisinopril 20mg daily, managed by PCP -BPat 158/63 (12/30/18)  4. Pulmonary nodules and groundglass change -noticed on previous CT scan, follow up scan showedstable pulmonary nodules, groundglass changes have resolved -Stable small nodules on 09/28/18 CT CAP. Also shows her new pulmonary nodule is very small 4mm, which is indeterminate, no highly suspicious for lung mets. Will continue monitoring.  5. CKD, Stage III -I advised her to avoid regular use of Advil orAleve -I encouraged her to drink more water -Cr at 1.41 today (12/30/18), overall stable   Plan -She is clinically doing well  -continue Imatinib  -Lab and F/u in 3 months with CT CAP WO contrast a few days before.    No problem-specific Assessment & Plan notes found for this encounter.   Orders Placed This Encounter  Procedures  . CT Abdomen Pelvis Wo Contrast    Standing Status:   Future    Standing Expiration Date:   12/30/2019    Order Specific Question:   Preferred imaging location?    Answer:   [facility]    Order Specific Question:   Is Oral Contrast requested for this exam?    Answer:   Yes, Per Radiology protocol    Order Specific Question:   Radiology Contrast Protocol - do NOT remove file path    Answer:   \\[email or web address]  . CT Chest Wo Contrast    Standing Status:   Future    Standing Expiration Date:   12/30/2019    Order Specific Question:   Preferred imaging location?    Answer:   [facility]    Order Specific  Question:   Radiology Contrast Protocol - do NOT remove file path    Answer:   \\[email or web address]   All questions were answered. The patient knows to call the clinic with any problems, questions or concerns. No barriers to learning was detected. I spent 20 minutes counseling the patient face to face. The total time spent in the appointment was 25 minutes and more than 50% was on counseling and review of test results     Yan Feng, MD 12/30/2018   I, Amoya Bennett, am acting as scribe for Yan Feng, MD.   I have reviewed the above documentation for accuracy and completeness, and I agree with the above.       

## 2018-12-28 MED FILL — IMATINIB MESYLATE 400 MG TA: 400 | 30 days supply | Qty: 30 | Fill #1

## 2018-12-30 ENCOUNTER — Telehealth: Payer: Self-pay | Admitting: Hematology

## 2018-12-30 ENCOUNTER — Inpatient Hospital Stay (HOSPITAL_BASED_OUTPATIENT_CLINIC_OR_DEPARTMENT_OTHER): Payer: Medicare Other | Admitting: Hematology

## 2018-12-30 ENCOUNTER — Encounter: Payer: Self-pay | Admitting: Hematology

## 2018-12-30 ENCOUNTER — Other Ambulatory Visit: Payer: Self-pay

## 2018-12-30 ENCOUNTER — Inpatient Hospital Stay: Payer: Medicare Other | Attending: Hematology

## 2018-12-30 VITALS — BP 158/63 | HR 70 | Temp 98.9°F | Resp 17 | Ht 67.0 in | Wt 160.8 lb

## 2018-12-30 DIAGNOSIS — C49A2 Gastrointestinal stromal tumor of stomach: Secondary | ICD-10-CM | POA: Diagnosis present

## 2018-12-30 DIAGNOSIS — Z79899 Other long term (current) drug therapy: Secondary | ICD-10-CM | POA: Insufficient documentation

## 2018-12-30 DIAGNOSIS — R918 Other nonspecific abnormal finding of lung field: Secondary | ICD-10-CM | POA: Insufficient documentation

## 2018-12-30 DIAGNOSIS — C786 Secondary malignant neoplasm of retroperitoneum and peritoneum: Secondary | ICD-10-CM | POA: Diagnosis not present

## 2018-12-30 DIAGNOSIS — D509 Iron deficiency anemia, unspecified: Secondary | ICD-10-CM

## 2018-12-30 DIAGNOSIS — C787 Secondary malignant neoplasm of liver and intrahepatic bile duct: Secondary | ICD-10-CM | POA: Insufficient documentation

## 2018-12-30 DIAGNOSIS — I129 Hypertensive chronic kidney disease with stage 1 through stage 4 chronic kidney disease, or unspecified chronic kidney disease: Secondary | ICD-10-CM | POA: Insufficient documentation

## 2018-12-30 DIAGNOSIS — I10 Essential (primary) hypertension: Secondary | ICD-10-CM

## 2018-12-30 DIAGNOSIS — N183 Chronic kidney disease, stage 3 (moderate): Secondary | ICD-10-CM | POA: Insufficient documentation

## 2018-12-30 LAB — CMP (CANCER CENTER ONLY)
ALT: 8 U/L (ref 0–44)
AST: 20 U/L (ref 15–41)
Albumin: 4.1 g/dL (ref 3.5–5.0)
Alkaline Phosphatase: 57 U/L (ref 38–126)
Anion gap: 7 (ref 5–15)
BUN: 11 mg/dL (ref 8–23)
CO2: 25 mmol/L (ref 22–32)
Calcium: 8.8 mg/dL — ABNORMAL LOW (ref 8.9–10.3)
Chloride: 110 mmol/L (ref 98–111)
Creatinine: 1.41 mg/dL — ABNORMAL HIGH (ref 0.44–1.00)
GFR, Est AFR Am: 40 mL/min — ABNORMAL LOW (ref >60)
GFR, Estimated: 34 mL/min — ABNORMAL LOW (ref >60)
Glucose, Bld: 82 mg/dL (ref 70–99)
Potassium: 4 mmol/L (ref 3.5–5.1)
Sodium: 142 mmol/L (ref 135–145)
Total Bilirubin: 0.4 mg/dL (ref 0.3–1.2)
Total Protein: 7.2 g/dL (ref 6.5–8.1)

## 2018-12-30 LAB — CBC WITH DIFFERENTIAL/PLATELET
Abs Immature Granulocytes: 0 10*3/uL (ref 0.00–0.07)
Basophils Absolute: 0 10*3/uL (ref 0.0–0.1)
Basophils Relative: 1 %
Eosinophils Absolute: 0.2 10*3/uL (ref 0.0–0.5)
Eosinophils Relative: 4 %
HCT: 35.4 % — ABNORMAL LOW (ref 36.0–46.0)
Hemoglobin: 11.8 g/dL — ABNORMAL LOW (ref 12.0–15.0)
Immature Granulocytes: 0 %
Lymphocytes Relative: 40 %
Lymphs Abs: 1.6 10*3/uL (ref 0.7–4.0)
MCH: 35.4 pg — ABNORMAL HIGH (ref 26.0–34.0)
MCHC: 33.3 g/dL (ref 30.0–36.0)
MCV: 106.3 fL — ABNORMAL HIGH (ref 80.0–100.0)
Monocytes Absolute: 0.4 10*3/uL (ref 0.1–1.0)
Monocytes Relative: 11 %
Neutro Abs: 1.8 10*3/uL (ref 1.7–7.7)
Neutrophils Relative %: 44 %
Platelets: 155 10*3/uL (ref 150–400)
RBC: 3.33 MIL/uL — ABNORMAL LOW (ref 3.87–5.11)
RDW: 12.6 % (ref 11.5–15.5)
WBC: 4 10*3/uL (ref 4.0–10.5)
nRBC: 0 % (ref 0.0–0.2)

## 2018-12-30 NOTE — Telephone Encounter (Signed)
Scheduled per 07/16 los, patient will call back to schedule labs when CT scan is scheduled.

## 2019-01-14 ENCOUNTER — Other Ambulatory Visit: Payer: Self-pay | Admitting: Internal Medicine

## 2019-01-24 MED FILL — IMATINIB MESYLATE 400 MG TA: 400 | 30 days supply | Qty: 30 | Fill #2

## 2019-02-22 MED FILL — IMATINIB MESYLATE 400 MG TA: 400 | 30 days supply | Qty: 30 | Fill #3

## 2019-03-09 ENCOUNTER — Telehealth: Payer: Self-pay | Admitting: Pharmacist

## 2019-03-09 NOTE — Telephone Encounter (Signed)
Oral Chemotherapy Pharmacist Encounter   Spoke with patient today to follow up regarding patient's oral chemotherapy medication: Gleevec (imatinib) 400 mg once daily for malignant gastrointestinal stromal tumor (GIST) of stomach.  Original Start date of oral chemotherapy: 04/22/2016  Pt reports 0 tablets/doses of Gleevec (imatinib) 400 mg PO daily, take with meals and large glass of water, missed in the last month.   Pt reports the following side effects: patient stated she has infrequent diarrhea, but does not have to use anti-diarrheal medications for management. Educated patient on diarrhea management. Patient verbalized understanding.   Pertinent labs reviewed: CMP and CBC from 12/30/18 - noted patient has hxo of CKD and Scr was 1.41 (CrCl ~44 mL/min) - no dose adjustments necessary.  Patient knows to call the office with questions or concerns.  Leron Croak, PharmD PGY2 Hematology/Oncology Pharmacy Resident 03/09/2019 1:49 PM Oral Oncology Clinic [phone]

## 2019-03-23 MED FILL — IMATINIB MESYLATE 400 MG TA: 400 | 30 days supply | Qty: 30 | Fill #4

## 2019-03-25 NOTE — Progress Notes (Signed)
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   04/09/2016 Initial Biopsy   Gastric mass biopsy showed gastrointestinal stromal tumor, I feel mitosis and necrosis, intermediate to high-grade.   04/09/2016 Imaging   CT chest, abdomen and pelvis with contrast showed a large exophytic mass in the proximal lateral stomach measuring 10 x 7.4 cm, nodular lesions within omentum and multiple liver nodules are highly suspicious for metastatic disease.   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 Today's study demonstrates a positive response to therapy with regression of the primary gastric neoplasm, as well as regression of numerous intraperitoneal metastatic lesions. Additionally, the  liver lesions seen on the prior study appear far more well-defined, decreased in size and are much lower attenuation, suggesting internal areas of necrosis. 2. Several small pulmonary nodules are noted, some of which are new compared to the prior examination. These are nonspecific, but metastatic disease to the lungs is not excluded. Attention on follow-up imaging is recommended. 3. Importantly, there is also widespread ground-glass attenuation and septal thickening in the lungs bilaterally which is new compared to the prior study. In the appropriate clinical setting, this could reflect a drug reaction. Other differential considerations include atypical infection, or even developing interstitial lung disease. Clinical correlation is suggested. 4. Aortic atherosclerosis, in addition to right coronary artery disease. 5. Mild diffuse bronchial wall thickening with mild centrilobular and paraseptal emphysema; imaging findings suggestive of underlying COPD. 6. Additional incidental findings, as above.   09/22/2016 Imaging    CT CAP W CONTRAST   IMPRESSION: 1. Nodular appearing GI stromal tumor along the posterior gastric wall with associated peritoneal implants and hepatic metastases, stable from 07/16/2016. 2. Improving mid and lower lung zone predominant peribronchovascular ground-glass, possibly related to drug therapy. 3. A few scattered pulmonary nodules are stable. 4. Aortic atherosclerosis (ICD10-170.0). Coronary artery calcification.   01/14/2017 Imaging   CT CAP w Contrast  IMPRESSION: 1. Further decrease in size of large GI stromal tumor arising from the greater curvature of the stomach with improvement in hepatic and multifocal peritoneal metastases. 2. Further improvement in lower lung predominant peribronchovascular ground-glass density, probably inflammatory (drug reaction or or infectious). 3. No acute findings. 4.  Aortic Atherosclerosis (ICD10-I70.0).    05/08/2017 Imaging   IMPRESSION: 1. Overall, today's examination is very  similar to recent prior studies again demonstrating a large proximal gastric mass with adjacent gastrosplenic ligament lymphadenopathy, multiple treated lesions in the liver, and other intraperitoneal metastases, as discussed above. No definite new sites of metastatic disease are otherwise noted. 2. Aortic atherosclerosis, in addition to right coronary artery disease. 3. Mild cardiomegaly. 4. There are calcifications of the aortic valve. Echocardiographic correlation for evaluation of potential valvular dysfunction may be warranted if clinically indicated. 5. Additional incidental findings, as above. Aortic Atherosclerosis (ICD10-I70.0).   09/07/2017 Imaging   CT CAP W Contrast 09/07/17 IMPRESSION: 1. Large mass extending off the greater curvature of the stomach is similar to prior. Majority of nodularity within the adjacent mesentery is similar. There is one adjacent nodule which has increased in size when compared to prior exam. 2. Grossly unchanged treated hepatic lesions.   01/04/2018 Imaging   CT CAP WO COntrast 01/04/18 IMPRESSION: 1. No substantial interval change in exam. 2. Irregular mass at the proximal stomach is similar to prior. Scattered soft tissue lesions in the perigastric reason, omentum, and mesentery are similar to prior. No generalized trend towards worsening or improving disease. No new or progressive findings on today's study. 3. Stable appearance multiple hepatic lesions. 4.  Aortic Atherosclerois (ICD10-170.0)   05/05/2018 Imaging   CT CAP 05/05/18  IMPRESSION: 1. Mild response to therapy, as evidenced by decreased size of many soft tissue lesions along the greater curvature of the stomach, as well as within the omentum. 2. Similar hepatic metastasis. 3.  No acute process or evidence of metastatic disease in the chest. 4.  Aortic Atherosclerosis (ICD10-I70.0). 5. Pulmonary  artery enlargement suggests pulmonary arterial hypertension.   09/28/2018 Imaging   CT CAP 09/28/18  IMPRESSION: 1. No change in soft tissue nodularity about the greater curvature of the stomach and pancreatic tail (series 2, image 53).  2. No change in noncontrast appearance of numerous small hypodense metastatic lesions of the liver.  3. New 4 mm irregular pulmonary nodule of the right pulmonary apex (series 6, image 32), nonspecific. Attention on follow-up. Additional stable small pulmonary nodules and ground-glass opacities, for example 3 mm adjacent nodules of the right upper lobe (series 6, image 51).   03/29/2019 Imaging   CT CAP WO Contrast  IMPRESSION: 1. Calcified nodular soft tissue seen along the posterior stomach and tail of pancreas is stable in the interval. No new or progressive interval findings. 2. Numerous low-density liver lesions compatible with metastatic disease. Overall appearance is stable. 3. 4 mm irregular right apical pulmonary nodule identified previously as new is unchanged in the interval. Continued attention on follow-up recommended. 4.  Aortic Atherosclerois (ICD10-170.0)      CURRENT THERAPY:  Gleevec 435m daily starting 04/22/16  INTERVAL HISTORY:  Haley FURRis here for a follow up of her GIST. She presents to the clinic alone. She notes she is doing well. She denies any new changes. She notes she has been staying home. She denies any breathing issues. She notes loose stool once a day in the morning. She notes this is manageable. She notes she checks BP at home and was 137/77 this morning and in clinic it was 167/61. She notes the blood pressure cuff was very tight which caused her pain. She notes right hip pain which comes and goes. She feels it may be related to arthritis. She notes this pain improved when she walks it off.    REVIEW OF SYSTEMS:   Constitutional: Denies fevers, chills or abnormal weight loss Eyes:  Denies  blurriness of vision Ears, nose, mouth, throat, and face: Denies mucositis or sore throat Respiratory: Denies cough, dyspnea or wheezes Cardiovascular: Denies palpitation, chest discomfort or lower extremity swelling Gastrointestinal:  Denies nausea, heartburn or change in bowel habits Skin: Denies abnormal skin rashes MSK: (+) Right hip pain, intermittent Lymphatics: Denies new lymphadenopathy or easy bruising Neurological:Denies numbness, tingling or new weaknesses Behavioral/Psych: Mood is stable, no new changes  All other systems were reviewed with the patient and are negative.  MEDICAL HISTORY:  Past Medical History:  Diagnosis Date  . Anemia   . Arthritis   . Colon polyps   . GIST DX'D  03/2016  . Olecranon bursitis    Elbow  . Other and unspecified hyperlipidemia   . Palpitations   . Unspecified essential hypertension     SURGICAL HISTORY: Past Surgical History:  Procedure Laterality Date  . COLONOSCOPY W/ POLYPECTOMY  01/2004  . CRYOTHERAPY     female-cryosurgery stated by pt  . ESOPHAGOGASTRODUODENOSCOPY N/A 04/09/2016   Procedure: ESOPHAGOGASTRODUODENOSCOPY (EGD);  Surgeon: Doran Stabler, MD;  Location: [facility] ENDOSCOPY;  Service: Endoscopy;  Laterality: N/A;  . POLYPECTOMY     from vocal surgery    I have reviewed the social history and family history with the patient and they are unchanged from previous note.  ALLERGIES:  has No Known Allergies.  MEDICATIONS:  Current Outpatient Medications  Medication Sig Dispense Refill  . amLODipine (NORVASC) 5 MG tablet TAKE 1 TABLET(5 MG) BY MOUTH DAILY 90 tablet 1  . Blood Pressure Monitoring (BLOOD PRESSURE CUFF) MISC Measure BP daily  Dx: hypertension 1 each 0  . Cholecalciferol (VITAMIN D3) 5000 units CAPS Take 5,000 Units by mouth daily.     . Cyanocobalamin (VITAMIN B 12 PO) Take 1 tablet by mouth daily.    Marland Kitchen imatinib (GLEEVEC) 400 MG tablet Take 1 tablet (400 mg total) by mouth daily. Take with meals and large  glass of water.Caution:Chemotherapy. 30 tablet 5  . lisinopril (ZESTRIL) 20 MG tablet TAKE 1 TABLET(20 MG) BY MOUTH DAILY 90 tablet 1   No current facility-administered medications for this visit.     PHYSICAL EXAMINATION: ECOG PERFORMANCE STATUS: 0 - Asymptomatic  Vitals:   03/30/19 1320  BP: (!) 167/61  Pulse: 65  Resp: 17  Temp: 99.1 F (37.3 C)  SpO2: 95%   Filed Weights   03/30/19 1320  Weight: 161 lb 9.6 oz (73.3 kg)    GENERAL:alert, no distress and comfortable SKIN: skin color, texture, turgor are normal, no rashes or significant lesions EYES: normal, Conjunctiva are pink and non-injected, sclera clear  NECK: supple, thyroid normal size, non-tender, without nodularity LYMPH:  no palpable lymphadenopathy in the cervical, axillary  LUNGS: clear to auscultation and percussion with normal breathing effort HEART: regular rate & rhythm and no murmurs and no lower extremity edema ABDOMEN:abdomen soft, non-tender and normal bowel sounds Musculoskeletal:no cyanosis of digits and no clubbing  NEURO: alert & oriented x 3 with fluent speech, no focal motor/sensory deficits  LABORATORY DATA:  I have reviewed the data as listed CBC Latest Ref Rng & Units 03/29/2019 12/30/2018 09/28/2018  WBC 4.0 - 10.5 K/uL 4.3 4.0 4.6  Hemoglobin 12.0 - 15.0 g/dL 12.2 11.8(L) 12.2  Hematocrit 36.0 - 46.0 % 37.1 35.4(L) 36.6  Platelets 150 - 400 K/uL 150 155 151     CMP Latest Ref Rng & Units 03/29/2019 12/30/2018 09/28/2018  Glucose 70 - 99 mg/dL 91 82 89  BUN 8 -  23 mg/dL _0 Creatinine 0.44 - 1.00 mg/dL 1.34(H) 1.41(H) 1.22(H)  Sodium 135 - 145 mmol/L 144 142 145  Potassium 3.5 - 5.1 mmol/L 3.9 4.0 3.6  Chloride 98 - 111 mmol/L 111 110 110  CO2 22 - 32 mmol/L _1 Calcium 8.9 - 10.3 mg/dL 9.2 8.8(L) 8.9  Total Protein 6.5 - 8.1 g/dL 7.3 7.2 7.4  Total Bilirubin 0.3 - 1.2 mg/dL 0.4 0.4 0.3  Alkaline Phos 38 - 126 U/L 58 57 62  AST 15 - 41 U/L _2 ALT 0 - 44 U/L _3 RADIOGRAPHIC STUDIES: I have personally reviewed the radiological images as listed and agreed with the findings in the report. Ct Abdomen Pelvis Wo Contrast  Result Date: 03/29/2019 CLINICAL DATA:  I had DM it EXAM: CT CHEST, ABDOMEN AND PELVIS WITHOUT CONTRAST TECHNIQUE: Multidetector CT imaging of the chest, abdomen and pelvis was performed following the standard protocol without IV contrast. COMPARISON:  09/28/2018 FINDINGS: CT CHEST FINDINGS Cardiovascular: The heart size is normal. No substantial pericardial effusion. Atherosclerotic calcification is noted in the wall of the thoracic aorta. Mediastinum/Nodes: No mediastinal lymphadenopathy. Calcified nodal tissue noted left hilum. Right hilum unremarkable. The esophagus has normal imaging features. There is no axillary lymphadenopathy. Lungs/Pleura: Centrilobular emphsyema noted. Tiny calcified right upper lobe granuloma is stable. The 4 mm nodule identified as new on the right lung apex previously is stable on today's exam (32/6). Noncalcified right upper lobe nodule on 51/6 is stable. No new suspicious pulmonary nodule or mass. No focal airspace consolidation. No pleural effusion. Musculoskeletal: No worrisome lytic or sclerotic osseous abnormality. CT ABDOMEN PELVIS FINDINGS Hepatobiliary: Numerous low-density liver lesions are again identified without substantial interval change. Index lesion in the lateral segment left liver (55/2) is 9 mm today compared to 10 mm when remeasured in a similar fashion on the prior study. Another index lesion identified in the inferior right liver adjacent to the gallbladder fossa (60/2) is 9 mm today compared to 9 mm when remeasured in a similar fashion previously. No definite new liver lesion on today's study. There is no evidence for gallstones, gallbladder wall thickening, or pericholecystic fluid. No intrahepatic or extrahepatic biliary dilation. Pancreas: No focal mass lesion. No dilatation of the main  duct. No intraparenchymal cyst. No peripancreatic edema. Spleen: No splenomegaly. No focal mass lesion. Adrenals/Urinary Tract: No adrenal nodule or mass. Kidneys unremarkable on this noncontrast study. No hydroureteronephrosis. The urinary bladder appears normal for the degree of distention. Stomach/Bowel: Stomach is unremarkable. No gastric wall thickening. No evidence of outlet obstruction. Duodenum is normally positioned as is the ligament of Treitz. No small bowel wall thickening. No small bowel dilatation. The terminal ileum is normal. The appendix is normal. No gross colonic mass. No colonic wall thickening. Vascular/Lymphatic: There is abdominal aortic atherosclerosis without aneurysm. There is no gastrohepatic or hepatoduodenal ligament lymphadenopathy. No intraperitoneal or retroperitoneal lymphadenopathy. No pelvic sidewall lymphadenopathy. Reproductive: The uterus is unremarkable.  There is no adnexal mass. Other: The calcified soft tissue seen adjacent to the posterior stomach and pancreatic tail is stable in the interval. No intraperitoneal free fluid. Musculoskeletal: Degenerative changes noted in the hips bilaterally. No worrisome lytic or sclerotic osseous abnormality. IMPRESSION: 1. Calcified nodular soft tissue seen along the posterior stomach and tail of pancreas is stable in the interval. No new or progressive interval findings. 2. Numerous low-density liver lesions compatible with metastatic disease. Overall appearance is stable.  3. 4 mm irregular right apical pulmonary nodule identified previously as new is unchanged in the interval. Continued attention on follow-up recommended. 4.  Aortic Atherosclerois (ICD10-170.0) Electronically Signed   By: Misty Stanley M.D.   On: 03/29/2019 13:23   Ct Chest Wo Contrast  Result Date: 03/29/2019 CLINICAL DATA:  I had DM it EXAM: CT CHEST, ABDOMEN AND PELVIS WITHOUT CONTRAST TECHNIQUE: Multidetector CT imaging of the chest, abdomen and pelvis was  performed following the standard protocol without IV contrast. COMPARISON:  09/28/2018 FINDINGS: CT CHEST FINDINGS Cardiovascular: The heart size is normal. No substantial pericardial effusion. Atherosclerotic calcification is noted in the wall of the thoracic aorta. Mediastinum/Nodes: No mediastinal lymphadenopathy. Calcified nodal tissue noted left hilum. Right hilum unremarkable. The esophagus has normal imaging features. There is no axillary lymphadenopathy. Lungs/Pleura: Centrilobular emphsyema noted. Tiny calcified right upper lobe granuloma is stable. The 4 mm nodule identified as new on the right lung apex previously is stable on today's exam (32/6). Noncalcified right upper lobe nodule on 51/6 is stable. No new suspicious pulmonary nodule or mass. No focal airspace consolidation. No pleural effusion. Musculoskeletal: No worrisome lytic or sclerotic osseous abnormality. CT ABDOMEN PELVIS FINDINGS Hepatobiliary: Numerous low-density liver lesions are again identified without substantial interval change. Index lesion in the lateral segment left liver (55/2) is 9 mm today compared to 10 mm when remeasured in a similar fashion on the prior study. Another index lesion identified in the inferior right liver adjacent to the gallbladder fossa (60/2) is 9 mm today compared to 9 mm when remeasured in a similar fashion previously. No definite new liver lesion on today's study. There is no evidence for gallstones, gallbladder wall thickening, or pericholecystic fluid. No intrahepatic or extrahepatic biliary dilation. Pancreas: No focal mass lesion. No dilatation of the main duct. No intraparenchymal cyst. No peripancreatic edema. Spleen: No splenomegaly. No focal mass lesion. Adrenals/Urinary Tract: No adrenal nodule or mass. Kidneys unremarkable on this noncontrast study. No hydroureteronephrosis. The urinary bladder appears normal for the degree of distention. Stomach/Bowel: Stomach is unremarkable. No gastric wall  thickening. No evidence of outlet obstruction. Duodenum is normally positioned as is the ligament of Treitz. No small bowel wall thickening. No small bowel dilatation. The terminal ileum is normal. The appendix is normal. No gross colonic mass. No colonic wall thickening. Vascular/Lymphatic: There is abdominal aortic atherosclerosis without aneurysm. There is no gastrohepatic or hepatoduodenal ligament lymphadenopathy. No intraperitoneal or retroperitoneal lymphadenopathy. No pelvic sidewall lymphadenopathy. Reproductive: The uterus is unremarkable.  There is no adnexal mass. Other: The calcified soft tissue seen adjacent to the posterior stomach and pancreatic tail is stable in the interval. No intraperitoneal free fluid. Musculoskeletal: Degenerative changes noted in the hips bilaterally. No worrisome lytic or sclerotic osseous abnormality. IMPRESSION: 1. Calcified nodular soft tissue seen along the posterior stomach and tail of pancreas is stable in the interval. No new or progressive interval findings. 2. Numerous low-density liver lesions compatible with metastatic disease. Overall appearance is stable. 3. 4 mm irregular right apical pulmonary nodule identified previously as new is unchanged in the interval. Continued attention on follow-up recommended. 4.  Aortic Atherosclerois (ICD10-170.0) Electronically Signed   By: Misty Stanley M.D.   On: 03/29/2019 13:23     ASSESSMENT & PLAN:  Haley Mcmillan is a 85 y.o. female with   1. Malignant gastrointestinal stromal tumor (GIST) of stomach, with liver and peritoneal metastasis -Diagnosed in 03/2016 andhas beentreated with Gleevec 419m daily since 04/22/16.Toleratingwell.  -Her restaging scans have  showed great response to [place]. -I personally reviewed and discussed her CT CAP from 03/29/19 with pt which shows calcified nodular soft tissue seen along posterior stomach and pancreas is stable in the interval.  Stable liver metastasis.  No new or  progressive findings. -She is clinically stable, and tolerating Gleevec very well, for her loose stool I encouraged her to drink plenty of water. To help her arthritis of right hip I encouraged her to walk 30-40 minutes 1-2 times a day. Labs reviewed from yesterday, CBC and CMP WNL except MCV 107.5, Cr 1.34.  -She will continue Gleevec -F/u in66month  2. Anemia, iron deficiency  -Continue oral ferous sulfate once daily.   -likely related to GRoss  -Currently resolved.  3. HTN, uncontrolled -Currently onamlodipine 5 mg and lisinopril 251mdaily, managed by PCP -BPat 167/61(03/30/19)  4. Pulmonary nodules and groundglass change -noticed on previous CT scan, follow up scan showedstable pulmonary nodules, groundglass changes have resolved -Stable small nodules on 09/28/18 CT CAP. Also shows her new pulmonary nodule is very small 30m8mwhich is indeterminate, no highly suspicious forlungmets.  -Unchanged on 03/29/19 CT CAP. Will continue monitoring.  5. CKD, Stage III -I advised her to avoid regular use of Advil orAleve -I encouraged her to drink more water -Cr at 1.37today (03/30/19), overall stable   Plan -CT scan reviewed with patient, stable disease -She is clinically doing well  -continue Imatinib, refilled today  -Lab andF/u [id number] No problem-specific Assessment & Plan notes found for this encounter.   No orders of the defined types were placed in this encounter.  All questions were answered. The patient knows to call the clinic with any problems, questions or concerns. No barriers to learning was detected. I spent 20 minutes counseling the patient face to face. The total time spent in the appointment was 25 minutes and more than 50% was on counseling and review of test results     Haley Mcmillan Truitt Merle 03/30/2019   I, AmoyJoslyn Devon acting as scribe for Dekayla Prestridge Truitt Merle.   I have reviewed the above documentation for accuracy and completeness, and I  agree with the above.

## 2019-03-29 ENCOUNTER — Encounter (HOSPITAL_COMMUNITY): Payer: Self-pay

## 2019-03-29 ENCOUNTER — Other Ambulatory Visit: Payer: Self-pay

## 2019-03-29 ENCOUNTER — Inpatient Hospital Stay: Payer: Medicare Other | Attending: Hematology

## 2019-03-29 ENCOUNTER — Ambulatory Visit (HOSPITAL_COMMUNITY)
Admission: RE | Admit: 2019-03-29 | Discharge: 2019-03-29 | Disposition: A | Discharge status: Home / Self Care | Payer: Medicare Other | Source: Ambulatory Visit | Attending: Hematology | Admitting: Hematology

## 2019-03-29 DIAGNOSIS — Z79899 Other long term (current) drug therapy: Secondary | ICD-10-CM | POA: Insufficient documentation

## 2019-03-29 DIAGNOSIS — C49A2 Gastrointestinal stromal tumor of stomach: Secondary | ICD-10-CM | POA: Insufficient documentation

## 2019-03-29 DIAGNOSIS — C787 Secondary malignant neoplasm of liver and intrahepatic bile duct: Secondary | ICD-10-CM | POA: Insufficient documentation

## 2019-03-29 DIAGNOSIS — N183 Chronic kidney disease, stage 3 unspecified: Secondary | ICD-10-CM | POA: Insufficient documentation

## 2019-03-29 DIAGNOSIS — C786 Secondary malignant neoplasm of retroperitoneum and peritoneum: Secondary | ICD-10-CM | POA: Insufficient documentation

## 2019-03-29 DIAGNOSIS — Z23 Encounter for immunization: Secondary | ICD-10-CM | POA: Insufficient documentation

## 2019-03-29 DIAGNOSIS — D509 Iron deficiency anemia, unspecified: Secondary | ICD-10-CM | POA: Insufficient documentation

## 2019-03-29 DIAGNOSIS — R918 Other nonspecific abnormal finding of lung field: Secondary | ICD-10-CM | POA: Diagnosis not present

## 2019-03-29 DIAGNOSIS — I129 Hypertensive chronic kidney disease with stage 1 through stage 4 chronic kidney disease, or unspecified chronic kidney disease: Secondary | ICD-10-CM | POA: Diagnosis not present

## 2019-03-29 LAB — COMPREHENSIVE METABOLIC PANEL
ALT: 11 U/L (ref 0–44)
AST: 23 U/L (ref 15–41)
Albumin: 4.2 g/dL (ref 3.5–5.0)
Alkaline Phosphatase: 58 U/L (ref 38–126)
Anion gap: 8 (ref 5–15)
BUN: 10 mg/dL (ref 8–23)
CO2: 25 mmol/L (ref 22–32)
Calcium: 9.2 mg/dL (ref 8.9–10.3)
Chloride: 111 mmol/L (ref 98–111)
Creatinine, Ser: 1.34 mg/dL — ABNORMAL HIGH (ref 0.44–1.00)
GFR calc Af Amer: 42 mL/min — ABNORMAL LOW (ref >60)
GFR calc non Af Amer: 37 mL/min — ABNORMAL LOW (ref >60)
Glucose, Bld: 91 mg/dL (ref 70–99)
Potassium: 3.9 mmol/L (ref 3.5–5.1)
Sodium: 144 mmol/L (ref 135–145)
Total Bilirubin: 0.4 mg/dL (ref 0.3–1.2)
Total Protein: 7.3 g/dL (ref 6.5–8.1)

## 2019-03-29 LAB — CBC WITH DIFFERENTIAL/PLATELET
Abs Immature Granulocytes: 0.01 10*3/uL (ref 0.00–0.07)
Basophils Absolute: 0 10*3/uL (ref 0.0–0.1)
Basophils Relative: 1 %
Eosinophils Absolute: 0.2 10*3/uL (ref 0.0–0.5)
Eosinophils Relative: 4 %
HCT: 37.1 % (ref 36.0–46.0)
Hemoglobin: 12.2 g/dL (ref 12.0–15.0)
Immature Granulocytes: 0 %
Lymphocytes Relative: 38 %
Lymphs Abs: 1.6 10*3/uL (ref 0.7–4.0)
MCH: 35.4 pg — ABNORMAL HIGH (ref 26.0–34.0)
MCHC: 32.9 g/dL (ref 30.0–36.0)
MCV: 107.5 fL — ABNORMAL HIGH (ref 80.0–100.0)
Monocytes Absolute: 0.3 10*3/uL (ref 0.1–1.0)
Monocytes Relative: 8 %
Neutro Abs: 2.1 10*3/uL (ref 1.7–7.7)
Neutrophils Relative %: 49 %
Platelets: 150 10*3/uL (ref 150–400)
RBC: 3.45 MIL/uL — ABNORMAL LOW (ref 3.87–5.11)
RDW: 12.8 % (ref 11.5–15.5)
WBC: 4.3 10*3/uL (ref 4.0–10.5)
nRBC: 0 % (ref 0.0–0.2)

## 2019-03-30 ENCOUNTER — Other Ambulatory Visit: Payer: Medicare Other

## 2019-03-30 ENCOUNTER — Other Ambulatory Visit: Payer: Self-pay

## 2019-03-30 ENCOUNTER — Inpatient Hospital Stay (HOSPITAL_BASED_OUTPATIENT_CLINIC_OR_DEPARTMENT_OTHER): Payer: Medicare Other | Admitting: Hematology

## 2019-03-30 ENCOUNTER — Encounter: Payer: Self-pay | Admitting: Hematology

## 2019-03-30 VITALS — BP 167/61 | HR 65 | Temp 99.1°F | Resp 17 | Ht 67.0 in | Wt 161.6 lb

## 2019-03-30 DIAGNOSIS — C49A2 Gastrointestinal stromal tumor of stomach: Secondary | ICD-10-CM

## 2019-03-30 DIAGNOSIS — Z23 Encounter for immunization: Secondary | ICD-10-CM | POA: Diagnosis not present

## 2019-03-30 MED ORDER — IMATINIB MESYLATE 400 MG PO TABS: 400.0000 mg | ORAL_TABLET | Freq: Every day | ORAL | 5 refills | Status: DC

## 2019-03-30 MED ORDER — INFLUENZA VAC A&B SA ADJ QUAD 0.5 ML IM PRSY
0.5000 mL | PREFILLED_SYRINGE | Freq: Once | INTRAMUSCULAR | Status: AC
  Administered 2019-03-30: 14:00:00 0.5 mL via INTRAMUSCULAR

## 2019-03-30 MED ORDER — INFLUENZA VAC A&B SA ADJ QUAD 0.5 ML IM PRSY
PREFILLED_SYRINGE | INTRAMUSCULAR | Status: AC
  Filled 2019-03-30: qty 0.5

## 2019-03-31 ENCOUNTER — Telehealth: Payer: Self-pay | Admitting: Hematology

## 2019-03-31 NOTE — Telephone Encounter (Signed)
Scheduled appt per 10/14 los.  Spoke with the patient and she is aware of the appt date and time.

## 2019-04-26 MED FILL — IMATINIB MESYLATE 400 MG TA: 400 | 30 days supply | Qty: 30 | Fill #4

## 2019-05-23 ENCOUNTER — Other Ambulatory Visit: Payer: Self-pay | Admitting: Hematology

## 2019-05-23 ENCOUNTER — Other Ambulatory Visit: Payer: Self-pay

## 2019-05-23 DIAGNOSIS — C49A2 Gastrointestinal stromal tumor of stomach: Secondary | ICD-10-CM

## 2019-05-23 MED ORDER — LISINOPRIL 20 MG PO TABS: ORAL_TABLET | ORAL | 1 refills | Status: DC

## 2019-05-25 MED FILL — IMATINIB MESYLATE 400 MG TA: 400 | 30 days supply | Qty: 30 | Fill #0

## 2019-06-27 NOTE — Progress Notes (Signed)
[place]   [phone] [phone]   Clinic Follow up Note   Patient Care Team: Hoyt Koch, MD as PCP - General (Internal Medicine) Loletha Carrow, Kirke Corin, MD as Consulting Physician (Gastroenterology) Truitt Merle, MD as Consulting Physician (Hematology)  Date of Service:  06/30/2019  CHIEF COMPLAINT: F/u for GIST  SUMMARY OF ONCOLOGIC HISTORY: Oncology History Overview Note  Malignant gastrointestinal stromal tumor (GIST) of stomach ([place])   Staging form: Gastric Stromal Tumor - Gastric Gist, AJCC 7th Edition   - Clinical stage from 04/09/2016: Stage IV (T3, N0, M1, Mitotic rate: Low) - Signed by Truitt Merle, MD on 04/20/2016    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   04/09/2016 Initial Biopsy   Gastric mass biopsy showed gastrointestinal stromal tumor, I feel mitosis and necrosis, intermediate to high-grade.   04/09/2016 Imaging   CT chest, abdomen and pelvis with contrast showed a large exophytic mass in the proximal lateral stomach measuring 10 x 7.4 cm, nodular lesions within omentum and multiple liver nodules are highly suspicious for metastatic disease.   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 Today's study demonstrates a positive response to therapy with regression of the primary gastric neoplasm, as well as regression of numerous intraperitoneal metastatic lesions. Additionally, the  liver lesions seen on the prior study appear far more well-defined, decreased in size and are much lower attenuation, suggesting internal areas of necrosis. 2. Several small pulmonary nodules are noted, some of which are new compared to the prior examination. These are nonspecific, but metastatic disease to the lungs is not excluded. Attention on follow-up imaging is recommended. 3. Importantly, there is also widespread ground-glass attenuation and septal thickening in the lungs bilaterally which is new compared to the prior study. In the appropriate clinical setting, this could reflect a drug reaction. Other differential considerations include atypical infection, or even developing interstitial lung disease. Clinical correlation is suggested. 4. Aortic atherosclerosis, in addition to right coronary artery disease. 5. Mild diffuse bronchial wall thickening with mild centrilobular and paraseptal emphysema; imaging findings suggestive of underlying COPD. 6. Additional incidental findings, as above.   09/22/2016 Imaging    CT CAP W CONTRAST   IMPRESSION: 1. Nodular appearing GI stromal tumor along the posterior gastric wall with associated peritoneal implants and hepatic metastases, stable from 07/16/2016. 2. Improving mid and lower lung zone predominant peribronchovascular ground-glass, possibly related to drug therapy. 3. A few scattered pulmonary nodules are stable. 4. Aortic atherosclerosis (ICD10-170.0). Coronary artery calcification.   01/14/2017 Imaging   CT CAP w Contrast  IMPRESSION: 1. Further decrease in size of large GI stromal tumor arising from the greater curvature of the stomach with improvement in hepatic and multifocal peritoneal metastases. 2. Further improvement in lower lung predominant peribronchovascular ground-glass density, probably inflammatory (drug reaction or or infectious). 3. No acute findings. 4.  Aortic Atherosclerosis (ICD10-I70.0).     05/08/2017 Imaging   IMPRESSION: 1. Overall, today's examination is  very similar to recent prior studies again demonstrating a large proximal gastric mass with adjacent gastrosplenic ligament lymphadenopathy, multiple treated lesions in the liver, and other intraperitoneal metastases, as discussed above. No definite new sites of metastatic disease are otherwise noted. 2. Aortic atherosclerosis, in addition to right coronary artery disease. 3. Mild cardiomegaly. 4. There are calcifications of the aortic valve. Echocardiographic correlation for evaluation of potential valvular dysfunction may be warranted if clinically indicated. 5. Additional incidental findings, as above. Aortic Atherosclerosis (ICD10-I70.0).   09/07/2017 Imaging   CT CAP W Contrast 09/07/17 IMPRESSION: 1. Large mass extending off the greater curvature of the stomach is similar to prior. Majority of nodularity within the adjacent mesentery is similar. There is one adjacent nodule which has increased in size when compared to prior exam. 2. Grossly unchanged treated hepatic lesions.   01/04/2018 Imaging   CT CAP WO COntrast 01/04/18 IMPRESSION: 1. No substantial interval change in exam. 2. Irregular mass at the proximal stomach is similar to prior. Scattered soft tissue lesions in the perigastric reason, omentum, and mesentery are similar to prior. No generalized trend towards worsening or improving disease. No new or progressive findings on today's study. 3. Stable appearance multiple hepatic lesions. 4.  Aortic Atherosclerois (ICD10-170.0)   05/05/2018 Imaging   CT CAP 05/05/18  IMPRESSION: 1. Mild response to therapy, as evidenced by decreased size of many soft tissue lesions along the greater curvature of the stomach, as well as within the omentum. 2. Similar hepatic metastasis. 3.  No acute process or evidence of metastatic disease in the chest. 4.  Aortic Atherosclerosis (ICD10-I70.0). 5. Pulmonary  artery enlargement suggests pulmonary arterial hypertension.   09/28/2018 Imaging   CT CAP 09/28/18  IMPRESSION: 1. No change in soft tissue nodularity about the greater curvature of the stomach and pancreatic tail (series 2, image 53).  2. No change in noncontrast appearance of numerous small hypodense metastatic lesions of the liver.  3. New 4 mm irregular pulmonary nodule of the right pulmonary apex (series 6, image 32), nonspecific. Attention on follow-up. Additional stable small pulmonary nodules and ground-glass opacities, for example 3 mm adjacent nodules of the right upper lobe (series 6, image 51).   03/29/2019 Imaging   CT CAP WO Contrast  IMPRESSION: 1. Calcified nodular soft tissue seen along the posterior stomach and tail of pancreas is stable in the interval. No new or progressive interval findings. 2. Numerous low-density liver lesions compatible with metastatic disease. Overall appearance is stable. 3. 4 mm irregular right apical pulmonary nodule identified previously as new is unchanged in the interval. Continued attention on follow-up recommended. 4.  Aortic Atherosclerois (ICD10-170.0)      CURRENT THERAPY:  Gleevec '400mg'$  daily starting 04/22/16  INTERVAL HISTORY:  Haley Mcmillan is here for a follow up of her GIST. She presents to the clinic alone. She notes she is doing well. She notes she is heard of hearing as she ages. She denies any new changes. She is interested in getting vaccination for [place]. She notes she does see her family, her bowling league and stays home for church. She tries to be active with walking.  She denies any abdominal pain. She has adequate appetite and weight stable. She notes when she tried 64 ounce of water in a day but she had polyuria especially at night.    REVIEW OF SYSTEMS:   Constitutional: Denies fevers, chills or abnormal weight loss Eyes: Denies blurriness of vision Ears, nose, mouth, throat, and face: Denies  mucositis or sore throat Respiratory: Denies cough, dyspnea or wheezes Cardiovascular: Denies palpitation, chest discomfort or lower extremity swelling Gastrointestinal:  Denies nausea, heartburn or change in bowel habits Skin: Denies abnormal skin rashes Lymphatics: Denies new lymphadenopathy or easy bruising Neurological:Denies numbness, tingling or new weaknesses Behavioral/Psych: Mood is stable, no new changes  All other systems were reviewed with the patient and are negative.  MEDICAL HISTORY:  Past Medical History:  Diagnosis Date  . Anemia   . Arthritis   . Colon polyps   . GIST DX'D  03/2016  . Olecranon bursitis    [place]  . Other and unspecified hyperlipidemia   . Palpitations   . Unspecified essential hypertension     SURGICAL HISTORY: Past Surgical History:  Procedure Laterality Date  . COLONOSCOPY W/ POLYPECTOMY  01/2004  . CRYOTHERAPY     female-cryosurgery stated by pt  . ESOPHAGOGASTRODUODENOSCOPY N/A 04/09/2016   Procedure: ESOPHAGOGASTRODUODENOSCOPY (EGD);  Surgeon: Doran Stabler, MD;  Location: [facility]COPY;  Service: Endoscopy;  Laterality: N/A;  . POLYPECTOMY     from vocal surgery    I have reviewed the social history and family history with the patient and they are unchanged from previous note.  ALLERGIES:  has No Known Allergies.  MEDICATIONS:  Current Outpatient Medications  Medication Sig Dispense Refill  . amLODipine (NORVASC) 5 MG tablet TAKE 1 TABLET(5 MG) BY MOUTH DAILY 90 tablet 1  . Blood Pressure Monitoring (BLOOD PRESSURE CUFF) MISC Measure BP daily  Dx: hypertension 1 each 0  . Cholecalciferol (VITAMIN D3) 5000 units CAPS Take 5,000 Units by mouth daily.     . Cyanocobalamin (VITAMIN B 12 PO) Take 1 tablet by mouth daily.    Marland Kitchen imatinib (GLEEVEC) 400 MG tablet TAKE 1 TABLET (400 MG TOTAL) BY MOUTH DAILY. TAKE WITH MEALS AND LARGE GLASS OF WATER.CAUTION:CHEMOTHERAPY. 30 tablet 5  . lisinopril (ZESTRIL) 20 MG tablet TAKE 1 TABLET(20  MG) BY MOUTH DAILY 90 tablet 1   No current facility-administered medications for this visit.    PHYSICAL EXAMINATION: ECOG PERFORMANCE STATUS: 1 - Symptomatic but completely ambulatory  Vitals:   06/30/19 1332  BP: (!) 148/60  Pulse: 63  Resp: 20  Temp: 98.7 F (37.1 C)  SpO2: 95%   Filed Weights   06/30/19 1332  Weight: 159 lb 9.6 oz (72.4 kg)    Due to COVID19 we will limit examination to appearance. Patient had no complaints.  GENERAL:alert, no distress and comfortable SKIN: skin color normal, no rashes or significant lesions EYES: normal, Conjunctiva are pink and non-injected, sclera clear  NEURO: alert & oriented x 3 with fluent speech   LABORATORY DATA:  I have reviewed the data as listed CBC Latest Ref Rng & Units 06/30/2019 03/29/2019 12/30/2018  WBC 4.0 - 10.5 K/uL 4.5 4.3 4.0  Hemoglobin 12.0 - 15.0 g/dL 12.1 12.2 11.8(L)  Hematocrit 36.0 - 46.0 % 36.1 37.1 35.4(L)  Platelets 150 - 400 K/uL 152 150 155     CMP Latest Ref Rng & Units 06/30/2019 03/29/2019 12/30/2018  Glucose 70 - 99 mg/dL 101(H) 91 82  BUN 8 - 23 mg/dL '12 10 11  '$ Creatinine 0.44 - 1.00 mg/dL 1.51(H) 1.34(H) 1.41(H)  Sodium 135 - 145 mmol/L 145 144 142  Potassium 3.5 - 5.1 mmol/L 4.1 3.9 4.0  Chloride 98 - 111 mmol/L 113(H) 111 110  CO2 22 - 32 mmol/L '23 25 25  '$ Calcium 8.9 - 10.3 mg/dL 8.9 9.2 8.8(L)  Total Protein 6.5 -  8.1 g/dL 7.2 7.3 7.2  Total Bilirubin 0.3 - 1.2 mg/dL 0.3 0.4 0.4  Alkaline Phos 38 - 126 U/L 60 58 57  AST 15 - 41 U/L '18 23 20  '$ ALT 0 - 44 U/L '8 11 8      '$ RADIOGRAPHIC STUDIES: I have personally reviewed the radiological images as listed and agreed with the findings in the report. No results found.   ASSESSMENT & PLAN:  DHRITHI RICHE is a 86 y.o. female with    1. Malignant gastrointestinal stromal tumor (GIST) of stomach, with liver and peritoneal metastasis -Diagnosed in 03/2016 andhas beentreated with Gleevec '400mg'$  daily since 04/22/16.Toleratingwell.    -Her restaging scans have showed great response to [place]. -She is clinically doing well. Labs reviewed and stable. Overall adequate to continue Gleevec '400mg'$  daily.  -Plan to scan her in 3 months with f/u.  -She is interested in [place] vaccine. I encouraged her to proceed and continue COVID precautions.   2. Anemia, iron deficiency  -Continue oral ferous sulfate once daily.   -likely related to [place]. -Currently resolved.  3. HTN, uncontrolled -Currently onamlodipine 5 mg and lisinopril '20mg'$  daily, managed by PCP  4. Pulmonary nodules   -They were stableon 09/28/18 CT CAP. Also showsher new pulmonary nodule is very small 67m, which is indeterminate, no highly suspicious forlungmets.  -Unchanged on 03/29/19 CT CAP. Will continue monitoring.  5. CKD, Stage III -I advised her to avoid regular use of Advil orAleve -Does continue to fluctuate, Cr at 1.51 today (06/30/19). I encouraged her to drink 36-40 ounces a day.   Plan -She is clinically doing well -continueImatinib, refilled today  -F/u in365monthith CT CAP a few days before    No problem-specific Assessment & Plan notes found for this encounter.   Orders Placed This Encounter  Procedures  . CT Abdomen Pelvis Wo Contrast    Standing Status:   Future    Standing Expiration Date:   06/29/2020    Order Specific Question:   [facility]    [facility]  Order Specific Question:   Is Oral Contrast requested for this exam?    Answer:   Yes, Per Radiology protocol    Order Specific Question:   Radiology Contrast Protocol - do NOT remove file path    Answer:   \\[email or web address]  . CT Chest Wo Contrast    Standing Status:   Future    Standing Expiration Date:   06/29/2020    Order Specific Question:   [facility]    [facility]  Order Specific Question:   Radiology Contrast Protocol - do NOT remove file path     Answer:   \\[email or web address]   All questions were answered. The patient knows to call the clinic with any problems, questions or concerns. No barriers to learning was detected. The total time spent in the appointment was 30 minutes.     YaTruitt MerleMD 06/30/2019   I, AmJoslyn Devonam acting as scribe for YaTruitt MerleMD.   I have reviewed the above documentation for accuracy and completeness, and I agree with the above.

## 2019-06-28 MED FILL — IMATINIB MESYLATE 400 MG TA: 400 | 30 days supply | Qty: 30 | Fill #1

## 2019-06-30 ENCOUNTER — Inpatient Hospital Stay (HOSPITAL_BASED_OUTPATIENT_CLINIC_OR_DEPARTMENT_OTHER): Payer: Medicare PPO | Admitting: Hematology

## 2019-06-30 ENCOUNTER — Inpatient Hospital Stay: Payer: Medicare PPO | Attending: Hematology

## 2019-06-30 ENCOUNTER — Telehealth: Payer: Self-pay | Admitting: Hematology

## 2019-06-30 ENCOUNTER — Other Ambulatory Visit: Payer: Self-pay

## 2019-06-30 VITALS — BP 148/60 | HR 63 | Temp 98.7°F | Resp 20 | Ht 67.0 in | Wt 159.6 lb

## 2019-06-30 DIAGNOSIS — C787 Secondary malignant neoplasm of liver and intrahepatic bile duct: Secondary | ICD-10-CM | POA: Insufficient documentation

## 2019-06-30 DIAGNOSIS — I10 Essential (primary) hypertension: Secondary | ICD-10-CM | POA: Diagnosis not present

## 2019-06-30 DIAGNOSIS — N183 Chronic kidney disease, stage 3 unspecified: Secondary | ICD-10-CM | POA: Diagnosis not present

## 2019-06-30 DIAGNOSIS — Z79899 Other long term (current) drug therapy: Secondary | ICD-10-CM | POA: Insufficient documentation

## 2019-06-30 DIAGNOSIS — C49A2 Gastrointestinal stromal tumor of stomach: Secondary | ICD-10-CM | POA: Insufficient documentation

## 2019-06-30 DIAGNOSIS — R918 Other nonspecific abnormal finding of lung field: Secondary | ICD-10-CM | POA: Insufficient documentation

## 2019-06-30 DIAGNOSIS — I129 Hypertensive chronic kidney disease with stage 1 through stage 4 chronic kidney disease, or unspecified chronic kidney disease: Secondary | ICD-10-CM | POA: Diagnosis not present

## 2019-06-30 DIAGNOSIS — D509 Iron deficiency anemia, unspecified: Secondary | ICD-10-CM | POA: Insufficient documentation

## 2019-06-30 DIAGNOSIS — D500 Iron deficiency anemia secondary to blood loss (chronic): Secondary | ICD-10-CM

## 2019-06-30 DIAGNOSIS — C786 Secondary malignant neoplasm of retroperitoneum and peritoneum: Secondary | ICD-10-CM | POA: Diagnosis not present

## 2019-06-30 LAB — CBC WITH DIFFERENTIAL/PLATELET
Abs Immature Granulocytes: 0.01 10*3/uL (ref 0.00–0.07)
Basophils Absolute: 0 10*3/uL (ref 0.0–0.1)
Basophils Relative: 1 %
Eosinophils Absolute: 0.3 10*3/uL (ref 0.0–0.5)
Eosinophils Relative: 7 %
HCT: 36.1 % (ref 36.0–46.0)
Hemoglobin: 12.1 g/dL (ref 12.0–15.0)
Immature Granulocytes: 0 %
Lymphocytes Relative: 33 %
Lymphs Abs: 1.5 10*3/uL (ref 0.7–4.0)
MCH: 36.3 pg — ABNORMAL HIGH (ref 26.0–34.0)
MCHC: 33.5 g/dL (ref 30.0–36.0)
MCV: 108.4 fL — ABNORMAL HIGH (ref 80.0–100.0)
Monocytes Absolute: 0.5 10*3/uL (ref 0.1–1.0)
Monocytes Relative: 10 %
Neutro Abs: 2.2 10*3/uL (ref 1.7–7.7)
Neutrophils Relative %: 49 %
Platelets: 152 10*3/uL (ref 150–400)
RBC: 3.33 MIL/uL — ABNORMAL LOW (ref 3.87–5.11)
RDW: 12.6 % (ref 11.5–15.5)
WBC: 4.5 10*3/uL (ref 4.0–10.5)
nRBC: 0 % (ref 0.0–0.2)

## 2019-06-30 LAB — COMPREHENSIVE METABOLIC PANEL
ALT: 8 U/L (ref 0–44)
AST: 18 U/L (ref 15–41)
Albumin: 4.2 g/dL (ref 3.5–5.0)
Alkaline Phosphatase: 60 U/L (ref 38–126)
Anion gap: 9 (ref 5–15)
BUN: 12 mg/dL (ref 8–23)
CO2: 23 mmol/L (ref 22–32)
Calcium: 8.9 mg/dL (ref 8.9–10.3)
Chloride: 113 mmol/L — ABNORMAL HIGH (ref 98–111)
Creatinine, Ser: 1.51 mg/dL — ABNORMAL HIGH (ref 0.44–1.00)
GFR calc Af Amer: 37 mL/min — ABNORMAL LOW (ref >60)
GFR calc non Af Amer: 32 mL/min — ABNORMAL LOW (ref >60)
Glucose, Bld: 101 mg/dL — ABNORMAL HIGH (ref 70–99)
Potassium: 4.1 mmol/L (ref 3.5–5.1)
Sodium: 145 mmol/L (ref 135–145)
Total Bilirubin: 0.3 mg/dL (ref 0.3–1.2)
Total Protein: 7.2 g/dL (ref 6.5–8.1)

## 2019-06-30 NOTE — Telephone Encounter (Signed)
Scheduled appt per 1/14 los.  Sent a message to HIM pool to get a calendar mailed out. 

## 2019-07-01 ENCOUNTER — Encounter: Payer: Self-pay | Admitting: Hematology

## 2019-07-10 ENCOUNTER — Ambulatory Visit: Payer: TRICARE For Life (TFL) | Attending: Internal Medicine

## 2019-07-10 DIAGNOSIS — Z23 Encounter for immunization: Secondary | ICD-10-CM

## 2019-07-12 NOTE — Progress Notes (Signed)
   Covid-19 Vaccination Clinic  Name:  Haley Mcmillan    MRN: [id number] DOB: 13-Apr-1934  07/10/2019  [name] was observed post Covid-19 immunization for 15 minutes without incidence. She was provided with Vaccine Information Sheet and instruction to access the V-Safe system.   [name] was instructed to call 911 with any severe reactions post vaccine: Marland Kitchen Difficulty breathing  . Swelling of your face and throat  . A fast heartbeat  . A bad rash all over your body  . Dizziness and weakness    Immunizations Administered    Name Date Dose VIS Date Route   Moderna COVID-19 Vaccine 07/10/2019  5:22 PM 0.5 mL 05/17/2019 Intramuscular   Manufacturer: Levan Hurst   LotJE:[id number]   [id number]      Documented on behalf of:  Tia Masker, MD

## 2019-07-22 ENCOUNTER — Telehealth: Payer: Self-pay

## 2019-07-22 MED ORDER — AMLODIPINE BESYLATE 5 MG PO TABS: ORAL_TABLET | ORAL | 0 refills | Status: DC

## 2019-07-22 NOTE — Telephone Encounter (Signed)
**  Patient calling and states that the pharmacy has tried to reach out last week and has not heard anything back. States that she has been out this medication since Sunday 07/17/2019**  Medication Refill - Medication: amLODipine (NORVASC) 5 MG tablet    Has the patient contacted their pharmacy? Yes.   (Agent: If no, request that the patient contact the pharmacy for the refill.) (Agent: If yes, when and what did the pharmacy advise?)  Preferred Pharmacy (with phone number or street name): [place], [place]  Agent: Please be advised that RX refills may take up to 3 business days. We ask that you follow-up with your pharmacy.

## 2019-07-27 ENCOUNTER — Ambulatory Visit (INDEPENDENT_AMBULATORY_CARE_PROVIDER_SITE_OTHER): Payer: Medicare PPO | Admitting: Internal Medicine

## 2019-07-27 ENCOUNTER — Other Ambulatory Visit: Payer: Self-pay

## 2019-07-27 ENCOUNTER — Encounter: Payer: Self-pay | Admitting: Internal Medicine

## 2019-07-27 VITALS — BP 124/72 | HR 57 | Temp 97.9°F | Ht 67.0 in | Wt 161.0 lb

## 2019-07-27 DIAGNOSIS — I10 Essential (primary) hypertension: Secondary | ICD-10-CM

## 2019-07-27 DIAGNOSIS — E7849 Other hyperlipidemia: Secondary | ICD-10-CM | POA: Diagnosis not present

## 2019-07-27 DIAGNOSIS — Z0000 Encounter for general adult medical examination without abnormal findings: Secondary | ICD-10-CM | POA: Diagnosis not present

## 2019-07-27 MED ORDER — LISINOPRIL 20 MG PO TABS: ORAL_TABLET | ORAL | 3 refills | Status: DC

## 2019-07-27 MED ORDER — AMLODIPINE BESYLATE 5 MG PO TABS: ORAL_TABLET | ORAL | 3 refills | Status: DC

## 2019-07-27 NOTE — Patient Instructions (Signed)
Health Maintenance, Female Adopting a healthy lifestyle and getting preventive care are important in promoting health and wellness. Ask your health care provider about:  The right schedule for you to have regular tests and exams.  Things you can do on your own to prevent diseases and keep yourself healthy. What should I know about diet, weight, and exercise? Eat a healthy diet   Eat a diet that includes plenty of vegetables, fruits, low-fat dairy products, and lean protein.  Do not eat a lot of foods that are high in solid fats, added sugars, or sodium. Maintain a healthy weight Body mass index (BMI) is used to identify weight problems. It estimates body fat based on height and weight. Your health care provider can help determine your BMI and help you achieve or maintain a healthy weight. Get regular exercise Get regular exercise. This is one of the most important things you can do for your health. Most adults should:  Exercise for at least 150 minutes each week. The exercise should increase your heart rate and make you sweat (moderate-intensity exercise).  Do strengthening exercises at least twice a week. This is in addition to the moderate-intensity exercise.  Spend less time sitting. Even light physical activity can be beneficial. Watch cholesterol and blood lipids Have your blood tested for lipids and cholesterol at 84 years of age, then have this test every 5 years. Have your cholesterol levels checked more often if:  Your lipid or cholesterol levels are high.  You are older than 84 years of age.  You are at high risk for heart disease. What should I know about cancer screening? Depending on your health history and family history, you may need to have cancer screening at various ages. This may include screening for:  Breast cancer.  Cervical cancer.  Colorectal cancer.  Skin cancer.  Lung cancer. What should I know about heart disease, diabetes, and high blood  pressure? Blood pressure and heart disease  High blood pressure causes heart disease and increases the risk of stroke. This is more likely to develop in people who have high blood pressure readings, are of African descent, or are overweight.  Have your blood pressure checked: ? Every 3-5 years if you are 18-39 years of age. ? Every year if you are 40 years old or older. Diabetes Have regular diabetes screenings. This checks your fasting blood sugar level. Have the screening done:  Once every three years after age 40 if you are at a normal weight and have a low risk for diabetes.  More often and at a younger age if you are overweight or have a high risk for diabetes. What should I know about preventing infection? Hepatitis B If you have a higher risk for hepatitis B, you should be screened for this virus. Talk with your health care provider to find out if you are at risk for hepatitis B infection. Hepatitis C Testing is recommended for:  Everyone born from 1945 through 1965.  Anyone with known risk factors for hepatitis C. Sexually transmitted infections (STIs)  Get screened for STIs, including gonorrhea and chlamydia, if: ? You are sexually active and are younger than 84 years of age. ? You are older than 84 years of age and your health care provider tells you that you are at risk for this type of infection. ? Your sexual activity has changed since you were last screened, and you are at increased risk for chlamydia or gonorrhea. Ask your health care provider if   you are at risk.  Ask your health care provider about whether you are at high risk for HIV. Your health care provider may recommend a prescription medicine to help prevent HIV infection. If you choose to take medicine to prevent HIV, you should first get tested for HIV. You should then be tested every 3 months for as long as you are taking the medicine. Pregnancy  If you are about to stop having your period (premenopausal) and  you may become pregnant, seek counseling before you get pregnant.  Take 400 to 800 micrograms (mcg) of folic acid every day if you become pregnant.  Ask for birth control (contraception) if you want to prevent pregnancy. Osteoporosis and menopause Osteoporosis is a disease in which the bones lose minerals and strength with aging. This can result in bone fractures. If you are 44 years old or older, or if you are at risk for osteoporosis and fractures, ask your health care provider if you should:  Be screened for bone loss.  Take a calcium or vitamin D supplement to lower your risk of fractures.  Be given hormone replacement therapy (HRT) to treat symptoms of menopause. Follow these instructions at home: Lifestyle  Do not use any products that contain nicotine or tobacco, such as cigarettes, e-cigarettes, and chewing tobacco. If you need help quitting, ask your health care provider.  Do not use street drugs.  Do not share needles.  Ask your health care provider for help if you need support or information about quitting drugs. Alcohol use  Do not drink alcohol if: ? Your health care provider tells you not to drink. ? You are pregnant, may be pregnant, or are planning to become pregnant.  If you drink alcohol: ? Limit how much you use to 0-1 drink a day. ? Limit intake if you are breastfeeding.  Be aware of how much alcohol is in your drink. In the [place] one drink equals one 12 oz bottle of beer (355 mL), one 5 oz glass of wine (148 mL), or one 1 oz glass of hard liquor (44 mL). General instructions  Schedule regular health, dental, and eye exams.  Stay current with your vaccines.  Tell your health care provider if: ? You often feel depressed. ? You have ever been abused or do not feel safe at home. Summary  Adopting a healthy lifestyle and getting preventive care are important in promoting health and wellness.  Follow your health care provider's instructions about healthy  diet, exercising, and getting tested or screened for diseases.  Follow your health care provider's instructions on monitoring your cholesterol and blood pressure. This information is not intended to replace advice given to you by your health care provider. Make sure you discuss any questions you have with your health care provider. Document Revised: 05/26/2018 Document Reviewed: 05/26/2018 Elsevier Patient Education  2020 [place].    Hip Rehab Ask your health care provider which exercises are safe for you. Do exercises exactly as told by your health care provider and adjust them as directed. It is normal to feel mild stretching, pulling, tightness, or discomfort as you do these exercises. Stop right away if you feel sudden pain or your pain gets worse. Do not begin these exercises until told by your health care provider. Stretching exercise This exercise warms up your muscles and joints and improves the movement and flexibility of your hip. This exercise also helps to relieve pain and stiffness. Iliotibial band stretch An iliotibial band is a strong  band of muscle tissue that runs from the outer side of your hip to the outer side of your thigh and knee. 1. Lie on your side with your left / right leg in the top position. 2. Bend your left / right knee and grab your ankle. Stretch out your bottom arm to help you balance. 3. Slowly bring your knee back so your thigh is behind your body. 4. Slowly lower your knee toward the floor until you feel a gentle stretch on the outside of your left / right thigh. If you do not feel a stretch and your knee will not fall farther, place the heel of your other foot on top of your knee and pull your knee down toward the floor with your foot. 5. Hold this position for __________ seconds. 6. Slowly return to the starting position. Repeat __________ times. Complete this exercise __________ times a day. Strengthening exercises These exercises build strength  and endurance in your hip and pelvis. Endurance is the ability to use your muscles for a long time, even after they get tired. Bridge This exercise strengthens the muscles that move your thigh backward (hip extensors). 1. Lie on your back on a firm surface with your knees bent and your feet flat on the floor. 2. Tighten your buttocks muscles and lift your buttocks off the floor until your trunk is level with your thighs. ? Do not arch your back. ? You should feel the muscles working in your buttocks and the back of your thighs. If you do not feel these muscles, slide your feet 1-2 inches (2.5-5 cm) farther away from your buttocks. ? If this exercise is too easy, try doing it with your arms crossed over your chest. 3. Hold this position for __________ seconds. 4. Slowly lower your hips to the starting position. 5. Let your muscles relax completely after each repetition. Repeat __________ times. Complete this exercise __________ times a day. Squats This exercise strengthens the muscles in front of your thigh and knee (quadriceps). 1. Stand in front of a table, with your feet and knees pointing straight ahead. You may rest your hands on the table for balance but not for support. 2. Slowly bend your knees and lower your hips like you are going to sit in a chair. ? Keep your weight over your heels, not over your toes. ? Keep your lower legs upright so they are parallel with the table legs. ? Do not let your hips go lower than your knees. ? Do not bend lower than told by your health care provider. ? If your hip pain increases, do not bend as low. 3. Hold the squat position for __________ seconds. 4. Slowly push with your legs to return to standing. Do not use your hands to pull yourself to standing. Repeat __________ times. Complete this exercise __________ times a day. Hip hike 1. Stand sideways on a bottom step. Stand on your left / right leg with your other foot unsupported next to the step.  You can hold on to the railing or wall for balance if needed. 2. Keep your knees straight and your torso square. Then lift your left / right hip up toward the ceiling. 3. Hold this position for __________ seconds. 4. Slowly let your left / right hip lower toward the floor, past the starting position. Your foot should get closer to the floor. Do not lean or bend your knees. Repeat __________ times. Complete this exercise __________ times a day. Single leg stand 1.  Without shoes, stand near a railing or in a doorway. You may hold on to the railing or door frame as needed for balance. 2. Squeeze your left / right buttock muscles, then lift up your other foot. ? Do not let your left / right hip push out to the side. ? It is helpful to stand in front of a mirror for this exercise so you can watch your hip. 3. Hold this position for __________ seconds. Repeat __________ times. Complete this exercise __________ times a day. This information is not intended to replace advice given to you by your health care provider. Make sure you discuss any questions you have with your health care provider. Document Revised: 09/27/2018 Document Reviewed: 09/27/2018 Elsevier Patient Education  [place].

## 2019-07-27 NOTE — Progress Notes (Signed)
   Subjective:   Patient ID: Haley Mcmillan, female    DOB: [birth year], 86 y.o.   MRN: [id number]  HPI The patient is an 86 YO female coming in for physical.   PMH, [facility], social history reviewed and updated.   Review of Systems  Constitutional: Negative.   HENT: Negative.   Eyes: Negative.   Respiratory: Negative for cough, chest tightness and shortness of breath.   Cardiovascular: Negative for chest pain, palpitations and leg swelling.  Gastrointestinal: Negative for abdominal distention, abdominal pain, constipation, diarrhea, nausea and vomiting.  Musculoskeletal: Negative.   Skin: Negative.   Neurological: Negative.   Psychiatric/Behavioral: Negative.     Objective:  Physical Exam Constitutional:      Appearance: She is well-developed.  HENT:     Head: Normocephalic and atraumatic.  Cardiovascular:     Rate and Rhythm: Normal rate and regular rhythm.  Pulmonary:     Effort: Pulmonary effort is normal. No respiratory distress.     Breath sounds: Normal breath sounds. No wheezing or rales.  Abdominal:     General: Bowel sounds are normal. There is no distension.     Palpations: Abdomen is soft.     Tenderness: There is no abdominal tenderness. There is no rebound.  Musculoskeletal:     Cervical back: Normal range of motion.  Skin:    General: Skin is warm and dry.  Neurological:     Mental Status: She is alert and oriented to person, place, and time.     Coordination: Coordination normal.     Vitals:   07/27/19 1033  BP: 124/72  Pulse: (!) 57  Temp: 97.9 F (36.6 C)  TempSrc: Oral  SpO2: 93%  Weight: 161 lb (73 kg)  Height: 5\' 7"  (1.702 m)    This visit occurred during the SARS-CoV-2 public health emergency.  Safety protocols were in place, including screening questions prior to the visit, additional usage of staff PPE, and extensive cleaning of exam room while observing appropriate contact time as indicated for disinfecting solutions.   Assessment &  Plan:

## 2019-07-28 MED FILL — IMATINIB MESYLATE 400 MG TA: 400 | 30 days supply | Qty: 30 | Fill #2

## 2019-07-28 NOTE — Assessment & Plan Note (Signed)
Checking lipid panel and adjust as needed.  

## 2019-07-28 NOTE — Assessment & Plan Note (Signed)
Taking lisinopril and amlodipine. Checking BMP and adjust as needed.

## 2019-07-28 NOTE — Assessment & Plan Note (Signed)
Flu shot up to date. Pneumonia complete. Shingrix counseled. Tetanus up to date. Colonoscopy aged out. Mammogram aged out, pap smear aged out and dexa up to date. Counseled about sun safety and mole surveillance. Counseled about the dangers of distracted driving. Given 10 year screening recommendations.  

## 2019-08-07 ENCOUNTER — Ambulatory Visit: Payer: Medicare PPO | Attending: Internal Medicine

## 2019-08-07 DIAGNOSIS — Z23 Encounter for immunization: Secondary | ICD-10-CM | POA: Insufficient documentation

## 2019-08-07 NOTE — Progress Notes (Signed)
   Covid-19 Vaccination Clinic  Name:  Haley Mcmillan    MRN: [id number] DOB: 07-Mar-1934  08/07/2019  Haley Mcmillan was observed post Covid-19 immunization for 15 minutes without incidence. She was provided with Vaccine Information Sheet and instruction to access the V-Safe system.   Haley Mcmillan was instructed to call 911 with any severe reactions post vaccine: Marland Kitchen Difficulty breathing  . Swelling of your face and throat  . A fast heartbeat  . A bad rash all over your body  . Dizziness and weakness    Immunizations Administered    Name Date Dose VIS Date Route   Moderna COVID-19 Vaccine 08/07/2019 12:45 PM 0.5 mL 05/17/2019 Intramuscular   Manufacturer: Moderna   Lot: [id number]   [id number]

## 2019-08-22 MED FILL — IMATINIB MESYLATE 400 MG TA: 400 | 30 days supply | Qty: 30 | Fill #3

## 2019-09-22 NOTE — Progress Notes (Signed)
[place]   Telephone:([phone]) [phone] Fax:([phone]) [phone]   Clinic Follow up Note   Patient Care Team: Hoyt Koch, MD as PCP - General (Internal Medicine) Loletha Carrow, Kirke Corin, MD as Consulting Physician (Gastroenterology) Truitt Merle, MD as Consulting Physician (Hematology)  Date of Service:  09/28/2019  CHIEF COMPLAINT: F/u for GIST  SUMMARY OF ONCOLOGIC HISTORY: Oncology History Overview Note  Malignant gastrointestinal stromal tumor (GIST) of stomach ([place])   Staging form: Gastric Stromal Tumor - Gastric Gist, AJCC 7th Edition   - Clinical stage from 04/09/2016: Stage IV (T3, N0, M1, Mitotic rate: Low) - Signed by Truitt Merle, MD on 04/20/2016    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   04/09/2016 Initial Biopsy   Gastric mass biopsy showed gastrointestinal stromal tumor, I feel mitosis and necrosis, intermediate to high-grade.   04/09/2016 Imaging   CT chest, abdomen and pelvis with contrast showed a large exophytic mass in the proximal lateral stomach measuring 10 x 7.4 cm, nodular lesions within omentum and multiple liver nodules are highly suspicious for metastatic disease.   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 Today's study demonstrates a positive response to therapy with regression of the primary gastric neoplasm, as well as regression of numerous intraperitoneal metastatic lesions. Additionally, the  liver lesions seen on the prior study appear far more well-defined, decreased in size and are much lower attenuation, suggesting internal areas of necrosis. 2. Several small pulmonary nodules are noted, some of which are new compared to the prior examination. These are nonspecific, but metastatic disease to the lungs is not excluded. Attention on follow-up imaging is recommended. 3. Importantly, there is also widespread ground-glass attenuation and septal thickening in the lungs bilaterally which is new compared to the prior study. In the appropriate clinical setting, this could reflect a drug reaction. Other differential considerations include atypical infection, or even developing interstitial lung disease. Clinical correlation is suggested. 4. Aortic atherosclerosis, in addition to right coronary artery disease. 5. Mild diffuse bronchial wall thickening with mild centrilobular and paraseptal emphysema; imaging findings suggestive of underlying COPD. 6. Additional incidental findings, as above.   09/22/2016 Imaging    CT CAP W CONTRAST   IMPRESSION: 1. Nodular appearing GI stromal tumor along the posterior gastric wall with associated peritoneal implants and hepatic metastases, stable from 07/16/2016. 2. Improving mid and lower lung zone predominant peribronchovascular ground-glass, possibly related to drug therapy. 3. A few scattered pulmonary nodules are stable. 4. Aortic atherosclerosis (ICD10-170.0). Coronary artery calcification.   01/14/2017 Imaging   CT CAP w Contrast  IMPRESSION: 1. Further decrease in size of large GI stromal tumor arising from the greater curvature of the stomach with improvement in hepatic and multifocal peritoneal metastases. 2. Further improvement in lower lung predominant peribronchovascular ground-glass density, probably inflammatory (drug reaction or or infectious). 3. No acute findings. 4.  Aortic Atherosclerosis (ICD10-I70.0).     05/08/2017 Imaging   IMPRESSION: 1. Overall, today's examination is  very similar to recent prior studies again demonstrating a large proximal gastric mass with adjacent gastrosplenic ligament lymphadenopathy, multiple treated lesions in the liver, and other intraperitoneal metastases, as discussed above. No definite new sites of metastatic disease are otherwise noted. 2. Aortic atherosclerosis, in addition to right coronary artery disease. 3. Mild cardiomegaly. 4. There are calcifications of the aortic valve. Echocardiographic correlation for evaluation of potential valvular dysfunction may be warranted if clinically indicated. 5. Additional incidental findings, as above. Aortic Atherosclerosis (ICD10-I70.0).   09/07/2017 Imaging   CT CAP W Contrast 09/07/17 IMPRESSION: 1. Large mass extending off the greater curvature of the stomach is similar to prior. Majority of nodularity within the adjacent mesentery is similar. There is one adjacent nodule which has increased in size when compared to prior exam. 2. Grossly unchanged treated hepatic lesions.   01/04/2018 Imaging   CT CAP WO COntrast 01/04/18 IMPRESSION: 1. No substantial interval change in exam. 2. Irregular mass at the proximal stomach is similar to prior. Scattered soft tissue lesions in the perigastric reason, omentum, and mesentery are similar to prior. No generalized trend towards worsening or improving disease. No new or progressive findings on today's study. 3. Stable appearance multiple hepatic lesions. 4.  Aortic Atherosclerois (ICD10-170.0)   05/05/2018 Imaging   CT CAP 05/05/18  IMPRESSION: 1. Mild response to therapy, as evidenced by decreased size of many soft tissue lesions along the greater curvature of the stomach, as well as within the omentum. 2. Similar hepatic metastasis. 3.  No acute process or evidence of metastatic disease in the chest. 4.  Aortic Atherosclerosis (ICD10-I70.0). 5. Pulmonary  artery enlargement suggests pulmonary arterial hypertension.   09/28/2018 Imaging   CT CAP 09/28/18  IMPRESSION: 1. No change in soft tissue nodularity about the greater curvature of the stomach and pancreatic tail (series 2, image 53).  2. No change in noncontrast appearance of numerous small hypodense metastatic lesions of the liver.  3. New 4 mm irregular pulmonary nodule of the right pulmonary apex (series 6, image 32), nonspecific. Attention on follow-up. Additional stable small pulmonary nodules and ground-glass opacities, for example 3 mm adjacent nodules of the right upper lobe (series 6, image 51).   03/29/2019 Imaging   CT CAP WO Contrast  IMPRESSION: 1. Calcified nodular soft tissue seen along the posterior stomach and tail of pancreas is stable in the interval. No new or progressive interval findings. 2. Numerous low-density liver lesions compatible with metastatic disease. Overall appearance is stable. 3. 4 mm irregular right apical pulmonary nodule identified previously as new is unchanged in the interval. Continued attention on follow-up recommended. 4.  Aortic Atherosclerois (ICD10-170.0)   09/26/2019 Imaging   CT CAP w Contrast  IMPRESSION: Chest Impression:   1. No evidence of thoracic metastasis.   Abdomen / Pelvis Impression:   1. Stable serosal nodularity along the greater curvature of the stomach. 2. Stable small round low-density lesions in liver. No significant change from CT 07/16/2016. 3. No evidence disease progression the abdomen or pelvis.      CURRENT THERAPY:  Gleevec 476m daily starting 04/22/16   INTERVAL HISTORY:  Haley PIDCOCKis here for a follow up of GIST. She presents to the clinic alone. She notes she is doing well and stable. She notes she has stopped sodas and has been drinking more water daily. She notes she snacks often during the day on fruit. She eats egg, bacon, toast and grits in the morning which will last her  the whole  day. She notes she has not been eating much when she takes [place] because she is not hungry.    REVIEW OF SYSTEMS:   Constitutional: Denies fevers, chills or abnormal weight loss Eyes: Denies blurriness of vision Ears, nose, mouth, throat, and face: Denies mucositis or sore throat Respiratory: Denies cough, dyspnea or wheezes Cardiovascular: Denies palpitation, chest discomfort or lower extremity swelling Gastrointestinal:  Denies nausea, heartburn or change in bowel habits Skin: Denies abnormal skin rashes Lymphatics: Denies new lymphadenopathy or easy bruising Neurological:Denies numbness, tingling or new weaknesses Behavioral/Psych: Mood is stable, no new changes  All other systems were reviewed with the patient and are negative.  MEDICAL HISTORY:  Past Medical History:  Diagnosis Date  . Anemia   . Arthritis   . Colon polyps   . GIST DX'D  03/2016  . Olecranon bursitis    Elbow  . Other and unspecified hyperlipidemia   . Palpitations   . Unspecified essential hypertension     SURGICAL HISTORY: Past Surgical History:  Procedure Laterality Date  . COLONOSCOPY W/ POLYPECTOMY  01/2004  . CRYOTHERAPY     female-cryosurgery stated by pt  . ESOPHAGOGASTRODUODENOSCOPY N/A 04/09/2016   Procedure: ESOPHAGOGASTRODUODENOSCOPY (EGD);  Surgeon: Doran Stabler, MD;  Location: [facility] ENDOSCOPY;  Service: Endoscopy;  Laterality: N/A;  . POLYPECTOMY     from vocal surgery    I have reviewed the social history and family history with the patient and they are unchanged from previous note.  ALLERGIES:  has No Known Allergies.  MEDICATIONS:  Current Outpatient Medications  Medication Sig Dispense Refill  . amLODipine (NORVASC) 5 MG tablet TAKE 1 TABLET(5 MG) BY MOUTH DAILY 90 tablet 3  . Blood Pressure Monitoring (BLOOD PRESSURE CUFF) MISC Measure BP daily  Dx: hypertension 1 each 0  . Cholecalciferol (VITAMIN D3) 5000 units CAPS Take 5,000 Units by mouth daily.     .  Cyanocobalamin (VITAMIN B 12 PO) Take 1 tablet by mouth daily.    Marland Kitchen imatinib (GLEEVEC) 400 MG tablet Take 1 tablet (400 mg total) by mouth daily. Take with meals and large glass of water.Caution:Chemotherapy. 30 tablet 5  . lisinopril (ZESTRIL) 20 MG tablet TAKE 1 TABLET(20 MG) BY MOUTH DAILY 90 tablet 3   No current facility-administered medications for this visit.    PHYSICAL EXAMINATION: ECOG PERFORMANCE STATUS: 0 - Asymptomatic  Vitals:   09/28/19 1043  BP: (!) 149/72  Pulse: 69  Resp: 18  Temp: 98.9 F (37.2 C)  SpO2: 100%   Filed Weights   09/28/19 1043  Weight: 160 lb 12.8 oz (72.9 kg)    Due to COVID19 we will limit examination to appearance. Patient had no complaints.  GENERAL:alert, no distress and comfortable SKIN: skin color normal, no rashes or significant lesions EYES: normal, Conjunctiva are pink and non-injected, sclera clear  NEURO: alert & oriented x 3 with fluent speech   LABORATORY DATA:  I have reviewed the data as listed CBC Latest Ref Rng & Units 09/26/2019 06/30/2019 03/29/2019  WBC 4.0 - 10.5 K/uL 4.6 4.5 4.3  Hemoglobin 12.0 - 15.0 g/dL 12.1 12.1 12.2  Hematocrit 36.0 - 46.0 % 36.4 36.1 37.1  Platelets 150 - 400 K/uL 149(L) 152 150     CMP Latest Ref Rng & Units 09/26/2019 06/30/2019 03/29/2019  Glucose 70 - 99 mg/dL 83 101(H) 91  BUN 8 - 23 mg/dL _0 Creatinine 0.44 - 1.00 mg/dL 1.22(H) 1.51(H) 1.34(H)  Sodium 135 - 145 mmol/L 145  145 144  Potassium 3.5 - 5.1 mmol/L 3.6 4.1 3.9  Chloride 98 - 111 mmol/L 113(H) 113(H) 111  CO2 22 - 32 mmol/L _0 Calcium 8.9 - 10.3 mg/dL 8.9 8.9 9.2  Total Protein 6.5 - 8.1 g/dL 7.0 7.2 7.3  Total Bilirubin 0.3 - 1.2 mg/dL 0.4 0.3 0.4  Alkaline Phos 38 - 126 U/L 57 60 58  AST 15 - 41 U/L _1 ALT 0 - 44 U/L _2 RADIOGRAPHIC STUDIES: I have personally reviewed the radiological images as listed and agreed with the findings in the report. No results found.   ASSESSMENT & PLAN:    Haley Mcmillan is a 86 y.o. female with    1. Malignant gastrointestinal stromal tumor (GIST) of stomach, with liver and peritoneal metastasis -Diagnosed in 03/2016 andhas beentreated with Gleevec 44m daily since 04/22/16.Toleratingwell.  -Her restaging scans have showed great response to [place] -I personally reviewed and discussed her CT CAP form 09/26/19 which shows stable serosal nodularity in stomach and stable liver lesions. No evidence of thoracic metastasis or other new lesions in AP. Her treatment is controlling her disease.  -Labs reviewed from this week, CBC and CMP WNL except plt 149K, MCV 107.4, Cr 1.22. Overall adequate to continue Gleevec 4069mdaily.  -She notes having heavy breakfast and snacking through the day so she does not feel hungry for meals later. I recommend she cut meat out of breakfast and have light lunch so she is able to have dinner for Gleevec.  -F/u in 3 months    2. Anemia, iron deficiency  -Continue oral ferous sulfate once daily. -likely related [place]-Currently resolved. MCV has been elevated.   3. HTN, uncontrolled -Currently onamlodipine 5 mg and lisinopril 2023maily, managed by PCP -BP has been elevated in clinic mildly.   4. Pulmonary nodules   -They were stableon 09/28/18 CT CAP. Also showsher new pulmonary nodule is very small 4mm12mhich is indeterminate, no highly suspicious forlungmets. -Unchanged on 03/29/19 CT CAP.Will continue monitoring.  5. CKD, Stage III -I advised her to avoid regular use of Advil orAleve -Does continue to fluctuate, Cr at improved to 1.22 today (09/28/19). I encouraged her to drink 36-40 ounces a day. She has cut back on sodas.   Plan -CT CAP reviewed, stable disease.  -She is clinically doing well -continueImatinib, refilled today -Lab and f/u in 3 months    No problem-specific Assessment & Plan notes found for this encounter.   No orders of the defined types were placed  in this encounter.  All questions were answered. The patient knows to call the clinic with any problems, questions or concerns. No barriers to learning was detected. The total time spent in the appointment was 30 minutes.     Sayvion Vigen Truitt Merle 09/28/2019   I, AmoyJoslyn Devon acting as scribe for Ayden Apodaca Truitt Merle.   I have reviewed the above documentation for accuracy and completeness, and I agree with the above.

## 2019-09-23 MED FILL — IMATINIB MESYLATE 400 MG TA: 400 | 30 days supply | Qty: 30 | Fill #4

## 2019-09-26 ENCOUNTER — Other Ambulatory Visit: Payer: Self-pay

## 2019-09-26 ENCOUNTER — Inpatient Hospital Stay: Payer: Medicare PPO | Attending: Hematology

## 2019-09-26 ENCOUNTER — Ambulatory Visit (HOSPITAL_COMMUNITY)
Admission: RE | Admit: 2019-09-26 | Discharge: 2019-09-26 | Disposition: A | Discharge status: Home / Self Care | Payer: Medicare PPO | Source: Ambulatory Visit | Attending: Hematology | Admitting: Hematology

## 2019-09-26 DIAGNOSIS — C786 Secondary malignant neoplasm of retroperitoneum and peritoneum: Secondary | ICD-10-CM | POA: Diagnosis not present

## 2019-09-26 DIAGNOSIS — N183 Chronic kidney disease, stage 3 unspecified: Secondary | ICD-10-CM | POA: Diagnosis not present

## 2019-09-26 DIAGNOSIS — Z79899 Other long term (current) drug therapy: Secondary | ICD-10-CM | POA: Diagnosis not present

## 2019-09-26 DIAGNOSIS — R918 Other nonspecific abnormal finding of lung field: Secondary | ICD-10-CM | POA: Insufficient documentation

## 2019-09-26 DIAGNOSIS — I129 Hypertensive chronic kidney disease with stage 1 through stage 4 chronic kidney disease, or unspecified chronic kidney disease: Secondary | ICD-10-CM | POA: Insufficient documentation

## 2019-09-26 DIAGNOSIS — C787 Secondary malignant neoplasm of liver and intrahepatic bile duct: Secondary | ICD-10-CM | POA: Insufficient documentation

## 2019-09-26 DIAGNOSIS — C49A2 Gastrointestinal stromal tumor of stomach: Secondary | ICD-10-CM | POA: Diagnosis not present

## 2019-09-26 DIAGNOSIS — D509 Iron deficiency anemia, unspecified: Secondary | ICD-10-CM | POA: Insufficient documentation

## 2019-09-26 DIAGNOSIS — C49A0 Gastrointestinal stromal tumor, unspecified site: Secondary | ICD-10-CM | POA: Diagnosis not present

## 2019-09-26 LAB — CBC WITH DIFFERENTIAL/PLATELET
Abs Immature Granulocytes: 0.02 10*3/uL (ref 0.00–0.07)
Basophils Absolute: 0 10*3/uL (ref 0.0–0.1)
Basophils Relative: 1 %
Eosinophils Absolute: 0.3 10*3/uL (ref 0.0–0.5)
Eosinophils Relative: 6 %
HCT: 36.4 % (ref 36.0–46.0)
Hemoglobin: 12.1 g/dL (ref 12.0–15.0)
Immature Granulocytes: 0 %
Lymphocytes Relative: 35 %
Lymphs Abs: 1.6 10*3/uL (ref 0.7–4.0)
MCH: 35.7 pg — ABNORMAL HIGH (ref 26.0–34.0)
MCHC: 33.2 g/dL (ref 30.0–36.0)
MCV: 107.4 fL — ABNORMAL HIGH (ref 80.0–100.0)
Monocytes Absolute: 0.4 10*3/uL (ref 0.1–1.0)
Monocytes Relative: 8 %
Neutro Abs: 2.3 10*3/uL (ref 1.7–7.7)
Neutrophils Relative %: 50 %
Platelets: 149 10*3/uL — ABNORMAL LOW (ref 150–400)
RBC: 3.39 MIL/uL — ABNORMAL LOW (ref 3.87–5.11)
RDW: 13 % (ref 11.5–15.5)
WBC: 4.6 10*3/uL (ref 4.0–10.5)
nRBC: 0 % (ref 0.0–0.2)

## 2019-09-26 LAB — COMPREHENSIVE METABOLIC PANEL
ALT: 8 U/L (ref 0–44)
AST: 20 U/L (ref 15–41)
Albumin: 3.9 g/dL (ref 3.5–5.0)
Alkaline Phosphatase: 57 U/L (ref 38–126)
Anion gap: 10 (ref 5–15)
BUN: 9 mg/dL (ref 8–23)
CO2: 22 mmol/L (ref 22–32)
Calcium: 8.9 mg/dL (ref 8.9–10.3)
Chloride: 113 mmol/L — ABNORMAL HIGH (ref 98–111)
Creatinine, Ser: 1.22 mg/dL — ABNORMAL HIGH (ref 0.44–1.00)
GFR calc Af Amer: 47 mL/min — ABNORMAL LOW (ref >60)
GFR calc non Af Amer: 41 mL/min — ABNORMAL LOW (ref >60)
Glucose, Bld: 83 mg/dL (ref 70–99)
Potassium: 3.6 mmol/L (ref 3.5–5.1)
Sodium: 145 mmol/L (ref 135–145)
Total Bilirubin: 0.4 mg/dL (ref 0.3–1.2)
Total Protein: 7 g/dL (ref 6.5–8.1)

## 2019-09-28 ENCOUNTER — Inpatient Hospital Stay (HOSPITAL_BASED_OUTPATIENT_CLINIC_OR_DEPARTMENT_OTHER): Payer: Medicare PPO | Admitting: Hematology

## 2019-09-28 ENCOUNTER — Encounter: Payer: Self-pay | Admitting: Hematology

## 2019-09-28 ENCOUNTER — Other Ambulatory Visit: Payer: Self-pay

## 2019-09-28 VITALS — BP 149/72 | HR 69 | Temp 98.9°F | Resp 18 | Ht 67.0 in | Wt 160.8 lb

## 2019-09-28 DIAGNOSIS — D509 Iron deficiency anemia, unspecified: Secondary | ICD-10-CM | POA: Diagnosis not present

## 2019-09-28 DIAGNOSIS — R918 Other nonspecific abnormal finding of lung field: Secondary | ICD-10-CM | POA: Diagnosis not present

## 2019-09-28 DIAGNOSIS — C786 Secondary malignant neoplasm of retroperitoneum and peritoneum: Secondary | ICD-10-CM | POA: Diagnosis not present

## 2019-09-28 DIAGNOSIS — C49A2 Gastrointestinal stromal tumor of stomach: Secondary | ICD-10-CM | POA: Diagnosis not present

## 2019-09-28 DIAGNOSIS — Z79899 Other long term (current) drug therapy: Secondary | ICD-10-CM | POA: Diagnosis not present

## 2019-09-28 DIAGNOSIS — C787 Secondary malignant neoplasm of liver and intrahepatic bile duct: Secondary | ICD-10-CM | POA: Diagnosis not present

## 2019-09-28 DIAGNOSIS — I129 Hypertensive chronic kidney disease with stage 1 through stage 4 chronic kidney disease, or unspecified chronic kidney disease: Secondary | ICD-10-CM | POA: Diagnosis not present

## 2019-09-28 DIAGNOSIS — I10 Essential (primary) hypertension: Secondary | ICD-10-CM | POA: Diagnosis not present

## 2019-09-28 DIAGNOSIS — N183 Chronic kidney disease, stage 3 unspecified: Secondary | ICD-10-CM | POA: Diagnosis not present

## 2019-09-28 MED ORDER — IMATINIB MESYLATE 400 MG PO TABS: 400.0000 mg | ORAL_TABLET | Freq: Every day | ORAL | 5 refills | Status: DC

## 2019-09-29 ENCOUNTER — Telehealth: Payer: Self-pay | Admitting: Hematology

## 2019-09-29 NOTE — Telephone Encounter (Signed)
Scheduled appt per 4/14 los.  Spoke with pt and she is aware of the appt date and time.

## 2019-10-22 ENCOUNTER — Other Ambulatory Visit: Payer: Self-pay | Admitting: Internal Medicine

## 2019-12-26 MED FILL — IMATINIB MESYLATE 400 MG TA: 400 | 30 days supply | Qty: 30 | Fill #1

## 2019-12-28 NOTE — Progress Notes (Signed)
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   04/09/2016 Initial Biopsy   Gastric mass biopsy showed gastrointestinal stromal tumor, I feel mitosis and necrosis, intermediate to high-grade.   04/09/2016 Imaging   CT chest, abdomen and pelvis with contrast showed a large exophytic mass in the proximal lateral stomach measuring 10 x 7.4 cm, nodular lesions within omentum and multiple liver nodules are highly suspicious for metastatic disease.   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 Today's study demonstrates a positive response to therapy with regression of the primary gastric neoplasm, as well as regression of numerous intraperitoneal metastatic lesions. Additionally, the  liver lesions seen on the prior study appear far more well-defined, decreased in size and are much lower attenuation, suggesting internal areas of necrosis. 2. Several small pulmonary nodules are noted, some of which are new compared to the prior examination. These are nonspecific, but metastatic disease to the lungs is not excluded. Attention on follow-up imaging is recommended. 3. Importantly, there is also widespread ground-glass attenuation and septal thickening in the lungs bilaterally which is new compared to the prior study. In the appropriate clinical setting, this could reflect a drug reaction. Other differential considerations include atypical infection, or even developing interstitial lung disease. Clinical correlation is suggested. 4. Aortic atherosclerosis, in addition to right coronary artery disease. 5. Mild diffuse bronchial wall thickening with mild centrilobular and paraseptal emphysema; imaging findings suggestive of underlying COPD. 6. Additional incidental findings, as above.   09/22/2016 Imaging    CT CAP W CONTRAST   IMPRESSION: 1. Nodular appearing GI stromal tumor along the posterior gastric wall with associated peritoneal implants and hepatic metastases, stable from 07/16/2016. 2. Improving mid and lower lung zone predominant peribronchovascular ground-glass, possibly related to drug therapy. 3. A few scattered pulmonary nodules are stable. 4. Aortic atherosclerosis (ICD10-170.0). Coronary artery calcification.   01/14/2017 Imaging   CT CAP w Contrast  IMPRESSION: 1. Further decrease in size of large GI stromal tumor arising from the greater curvature of the stomach with improvement in hepatic and multifocal peritoneal metastases. 2. Further improvement in lower lung predominant peribronchovascular ground-glass density, probably inflammatory (drug reaction or or infectious). 3. No acute findings. 4.  Aortic Atherosclerosis (ICD10-I70.0).     05/08/2017 Imaging   IMPRESSION: 1. Overall, today's examination is  very similar to recent prior studies again demonstrating a large proximal gastric mass with adjacent gastrosplenic ligament lymphadenopathy, multiple treated lesions in the liver, and other intraperitoneal metastases, as discussed above. No definite new sites of metastatic disease are otherwise noted. 2. Aortic atherosclerosis, in addition to right coronary artery disease. 3. Mild cardiomegaly. 4. There are calcifications of the aortic valve. Echocardiographic correlation for evaluation of potential valvular dysfunction may be warranted if clinically indicated. 5. Additional incidental findings, as above. Aortic Atherosclerosis (ICD10-I70.0).   09/07/2017 Imaging   CT CAP W Contrast 09/07/17 IMPRESSION: 1. Large mass extending off the greater curvature of the stomach is similar to prior. Majority of nodularity within the adjacent mesentery is similar. There is one adjacent nodule which has increased in size when compared to prior exam. 2. Grossly unchanged treated hepatic lesions.   01/04/2018 Imaging   CT CAP WO COntrast 01/04/18 IMPRESSION: 1. No substantial interval change in exam. 2. Irregular mass at the proximal stomach is similar to prior. Scattered soft tissue lesions in the perigastric reason, omentum, and mesentery are similar to prior. No generalized trend towards worsening or improving disease. No new or progressive findings on today's study. 3. Stable appearance multiple hepatic lesions. 4.  Aortic Atherosclerois (ICD10-170.0)   05/05/2018 Imaging   CT CAP 05/05/18  IMPRESSION: 1. Mild response to therapy, as evidenced by decreased size of many soft tissue lesions along the greater curvature of the stomach, as well as within the omentum. 2. Similar hepatic metastasis. 3.  No acute process or evidence of metastatic disease in the chest. 4.  Aortic Atherosclerosis (ICD10-I70.0). 5. Pulmonary  artery enlargement suggests pulmonary arterial hypertension.   09/28/2018 Imaging   CT CAP 09/28/18  IMPRESSION: 1. No change in soft tissue nodularity about the greater curvature of the stomach and pancreatic tail (series 2, image 53).  2. No change in noncontrast appearance of numerous small hypodense metastatic lesions of the liver.  3. New 4 mm irregular pulmonary nodule of the right pulmonary apex (series 6, image 32), nonspecific. Attention on follow-up. Additional stable small pulmonary nodules and ground-glass opacities, for example 3 mm adjacent nodules of the right upper lobe (series 6, image 51).   03/29/2019 Imaging   CT CAP WO Contrast  IMPRESSION: 1. Calcified nodular soft tissue seen along the posterior stomach and tail of pancreas is stable in the interval. No new or progressive interval findings. 2. Numerous low-density liver lesions compatible with metastatic disease. Overall appearance is stable. 3. 4 mm irregular right apical pulmonary nodule identified previously as new is unchanged in the interval. Continued attention on follow-up recommended. 4.  Aortic Atherosclerois (ICD10-170.0)   09/26/2019 Imaging   CT CAP w Contrast  IMPRESSION: Chest Impression:   1. No evidence of thoracic metastasis.   Abdomen / Pelvis Impression:   1. Stable serosal nodularity along the greater curvature of the stomach. 2. Stable small round low-density lesions in liver. No significant change from CT 07/16/2016. 3. No evidence disease progression the abdomen or pelvis.      CURRENT THERAPY:  Gleevec 469m daily starting 04/22/16  INTERVAL HISTORY:  [place]aley [place] here for a follow up of GIST. She presents to the clinic alone. She notes she is doing well with no new major changes. She denies stomach pain, bloating or nausea. She notes her BM are stable, unless she eats certain foods. She denies bloody or black stool. She has no current concerns. She is taking  Gleevec and tolerating well. She  notes she doe snot often check her BP at home. She continues to take her HTN Medications.     REVIEW OF SYSTEMS:   Constitutional: Denies fevers, chills or abnormal weight loss Eyes: Denies blurriness of vision Ears, nose, mouth, throat, and face: Denies mucositis or sore throat Respiratory: Denies cough, dyspnea or wheezes Cardiovascular: Denies palpitation, chest discomfort or lower extremity swelling Gastrointestinal:  Denies nausea, heartburn or change in bowel habits Skin: Denies abnormal skin rashes Lymphatics: Denies new lymphadenopathy or easy bruising Neurological:Denies numbness, tingling or new weaknesses Behavioral/Psych: Mood is stable, no new changes  All other systems were reviewed with the patient and are negative.  MEDICAL HISTORY:  Past Medical History:  Diagnosis Date  . Anemia   . Arthritis   . Colon polyps   . GIST DX'D  03/2016  . Olecranon bursitis    Elbow  . Other and unspecified hyperlipidemia   . Palpitations   . Unspecified essential hypertension     SURGICAL HISTORY: Past Surgical History:  Procedure Laterality Date  . COLONOSCOPY W/ POLYPECTOMY  01/2004  . CRYOTHERAPY     female-cryosurgery stated by pt  . ESOPHAGOGASTRODUODENOSCOPY N/A 04/09/2016   Procedure: ESOPHAGOGASTRODUODENOSCOPY (EGD);  Surgeon: Doran Stabler, MD;  Location: [facility] ENDOSCOPY;  Service: Endoscopy;  Laterality: N/A;  . POLYPECTOMY     from vocal surgery    I have reviewed the social history and family history with the patient and they are unchanged from previous note.  ALLERGIES:  has No Known Allergies.  MEDICATIONS:  Current Outpatient Medications  Medication Sig Dispense Refill  . amLODipine (NORVASC) 5 MG tablet TAKE 1 TABLET(5 MG) BY MOUTH DAILY 90 tablet 2  . Blood Pressure Monitoring (BLOOD PRESSURE CUFF) MISC Measure BP daily  Dx: hypertension 1 each 0  . Cholecalciferol (VITAMIN D3) 5000 units CAPS Take 5,000 Units by  mouth daily.     . Cyanocobalamin (VITAMIN B 12 PO) Take 1 tablet by mouth daily.    Marland Kitchen imatinib (GLEEVEC) 400 MG tablet Take 1 tablet (400 mg total) by mouth daily. Take with meals and large glass of water.Caution:Chemotherapy. 30 tablet 5  . lisinopril (ZESTRIL) 20 MG tablet TAKE 1 TABLET(20 MG) BY MOUTH DAILY 90 tablet 3   No current facility-administered medications for this visit.    PHYSICAL EXAMINATION: ECOG PERFORMANCE STATUS: 0 - Asymptomatic  Vitals:   12/29/19 1303  BP: (!) 148/56  Pulse: 60  Resp: 18  Temp: 98.1 F (36.7 C)  SpO2: 100%   Filed Weights   12/29/19 1303  Weight: 161 lb 12.8 oz (73.4 kg)    GENERAL:alert, no distress and comfortable SKIN: skin color, texture, turgor are normal, no rashes or significant lesions EYES: normal, Conjunctiva are pink and non-injected, sclera clear  NECK: supple, thyroid normal size, non-tender, without nodularity LYMPH:  no palpable lymphadenopathy in the cervical, axillary  LUNGS: clear to auscultation and percussion with normal breathing effort HEART: regular rate & rhythm and no murmurs and no lower extremity edema ABDOMEN:abdomen soft, non-tender and normal bowel sounds Musculoskeletal:no cyanosis of digits and no clubbing  NEURO: alert & oriented x 3 with fluent speech, no focal motor/sensory deficits  LABORATORY DATA:  I have reviewed the data as listed CBC Latest Ref Rng & Units 12/29/2019 09/26/2019 06/30/2019  WBC 4.0 - 10.5 K/uL 4.4 4.6 4.5  Hemoglobin 12.0 - 15.0 g/dL 11.6(L) 12.1 12.1  Hematocrit 36 - 46 % 34.2(L) 36.4 36.1  Platelets 150 - 400 K/uL 152 149(L)  152     CMP Latest Ref Rng & Units 12/29/2019 09/26/2019 06/30/2019  Glucose 70 - 99 mg/dL 85 83 101(H)  BUN 8 - 23 mg/dL _0 Creatinine 0.44 - 1.00 mg/dL 1.36(H) 1.22(H) 1.51(H)  Sodium 135 - 145 mmol/L 143 145 145  Potassium 3.5 - 5.1 mmol/L 4.0 3.6 4.1  Chloride 98 - 111 mmol/L 110 113(H) 113(H)  CO2 22 - 32 mmol/L _1 Calcium 8.9 - 10.3  mg/dL 9.1 8.9 8.9  Total Protein 6.5 - 8.1 g/dL 6.9 7.0 7.2  Total Bilirubin 0.3 - 1.2 mg/dL 0.4 0.4 0.3  Alkaline Phos 38 - 126 U/L 59 57 60  AST 15 - 41 U/L _2 ALT 0 - 44 U/L _3 RADIOGRAPHIC STUDIES: I have personally reviewed the radiological images as listed and agreed with the findings in the report. No results found.   ASSESSMENT & PLAN:  Haley Mcmillan is a 86 y.o. female with    1. Malignant gastrointestinal stromal tumor (GIST) of stomach, with liver and peritoneal metastasis -Diagnosed in 03/2016 andhas beentreated with Gleevec 447m daily since 04/22/16.Toleratingwell.  -Her restaging scans have showed great response to GCanon -She is clinically doing very well. Labs reviewed, CBC and CMP WNL except Hg 11.6. Physical exam unremarkable. Overall adequate to continue Gleevec 4048mdaily, tolerating well.  -Given she is doing so well will repeat her CT scan every 9 months, next in 06/2019.  -F/u in 3 months. Will order scan at that time.    2. Anemia, iron deficiency  -Continue oral ferous sulfate once daily. -likely related to [place]-Mild anemia has recurred today with Hg 11.6 (12/29/19)  3. HTN -Currently onamlodipine 5 mg and lisinopril 2079maily, managed by PCP -BP has been elevated in clinic mildly. I encouraged her to check her BP at home more often.   4. Pulmonary nodules  -They were stableon 09/28/18 CT CAP. Also showsher new pulmonary nodule is very small 4mm53mhich is indeterminate, no highly suspicious forlungmets. -Unchanged on 09/26/19 CT CAP.Will continue monitoring.  5. CKD, Stage III -I advised her to avoid regular use of Advil orAleve -Cr does continue to fluctuate from 1.20-1.55. Continue to drink 36-40 ounces a day.She has cut back on sodas.   Plan -Lab and F/u in 3 months, will order restaging CT on next visit    No problem-specific Assessment & Plan notes found for this encounter.   No orders of  the defined types were placed in this encounter.  All questions were answered. The patient knows to call the clinic with any problems, questions or concerns. No barriers to learning was detected.      Anas Reister Truitt Merle 12/29/2019   I, AmoyJoslyn Devon acting as scribe for Sheril Hammond Truitt Merle.   I have reviewed the above documentation for accuracy and completeness, and I agree with the above.

## 2019-12-29 ENCOUNTER — Other Ambulatory Visit: Payer: Self-pay

## 2019-12-29 ENCOUNTER — Inpatient Hospital Stay: Payer: Medicare PPO

## 2019-12-29 ENCOUNTER — Encounter: Payer: Self-pay | Admitting: Hematology

## 2019-12-29 ENCOUNTER — Telehealth: Payer: Self-pay | Admitting: Hematology

## 2019-12-29 ENCOUNTER — Inpatient Hospital Stay: Payer: Medicare PPO | Attending: Hematology | Admitting: Hematology

## 2019-12-29 VITALS — BP 148/56 | HR 60 | Temp 98.1°F | Resp 18 | Ht 67.0 in | Wt 161.8 lb

## 2019-12-29 DIAGNOSIS — C787 Secondary malignant neoplasm of liver and intrahepatic bile duct: Secondary | ICD-10-CM | POA: Diagnosis not present

## 2019-12-29 DIAGNOSIS — I129 Hypertensive chronic kidney disease with stage 1 through stage 4 chronic kidney disease, or unspecified chronic kidney disease: Secondary | ICD-10-CM | POA: Insufficient documentation

## 2019-12-29 DIAGNOSIS — N183 Chronic kidney disease, stage 3 unspecified: Secondary | ICD-10-CM | POA: Insufficient documentation

## 2019-12-29 DIAGNOSIS — R918 Other nonspecific abnormal finding of lung field: Secondary | ICD-10-CM | POA: Diagnosis not present

## 2019-12-29 DIAGNOSIS — I10 Essential (primary) hypertension: Secondary | ICD-10-CM | POA: Diagnosis not present

## 2019-12-29 DIAGNOSIS — Z79899 Other long term (current) drug therapy: Secondary | ICD-10-CM | POA: Diagnosis not present

## 2019-12-29 DIAGNOSIS — C49A2 Gastrointestinal stromal tumor of stomach: Secondary | ICD-10-CM

## 2019-12-29 DIAGNOSIS — D509 Iron deficiency anemia, unspecified: Secondary | ICD-10-CM | POA: Diagnosis not present

## 2019-12-29 DIAGNOSIS — C786 Secondary malignant neoplasm of retroperitoneum and peritoneum: Secondary | ICD-10-CM | POA: Insufficient documentation

## 2019-12-29 LAB — CBC WITH DIFFERENTIAL/PLATELET
Abs Immature Granulocytes: 0.01 10*3/uL (ref 0.00–0.07)
Basophils Absolute: 0 10*3/uL (ref 0.0–0.1)
Basophils Relative: 1 %
Eosinophils Absolute: 0.2 10*3/uL (ref 0.0–0.5)
Eosinophils Relative: 5 %
HCT: 34.2 % — ABNORMAL LOW (ref 36.0–46.0)
Hemoglobin: 11.6 g/dL — ABNORMAL LOW (ref 12.0–15.0)
Immature Granulocytes: 0 %
Lymphocytes Relative: 39 %
Lymphs Abs: 1.7 10*3/uL (ref 0.7–4.0)
MCH: 36.4 pg — ABNORMAL HIGH (ref 26.0–34.0)
MCHC: 33.9 g/dL (ref 30.0–36.0)
MCV: 107.2 fL — ABNORMAL HIGH (ref 80.0–100.0)
Monocytes Absolute: 0.4 10*3/uL (ref 0.1–1.0)
Monocytes Relative: 10 %
Neutro Abs: 2 10*3/uL (ref 1.7–7.7)
Neutrophils Relative %: 45 %
Platelets: 152 10*3/uL (ref 150–400)
RBC: 3.19 MIL/uL — ABNORMAL LOW (ref 3.87–5.11)
RDW: 12.6 % (ref 11.5–15.5)
WBC: 4.4 10*3/uL (ref 4.0–10.5)
nRBC: 0 % (ref 0.0–0.2)

## 2019-12-29 LAB — COMPREHENSIVE METABOLIC PANEL
ALT: 10 U/L (ref 0–44)
AST: 22 U/L (ref 15–41)
Albumin: 3.8 g/dL (ref 3.5–5.0)
Alkaline Phosphatase: 59 U/L (ref 38–126)
Anion gap: 9 (ref 5–15)
BUN: 9 mg/dL (ref 8–23)
CO2: 24 mmol/L (ref 22–32)
Calcium: 9.1 mg/dL (ref 8.9–10.3)
Chloride: 110 mmol/L (ref 98–111)
Creatinine, Ser: 1.36 mg/dL — ABNORMAL HIGH (ref 0.44–1.00)
GFR calc Af Amer: 41 mL/min — ABNORMAL LOW (ref >60)
GFR calc non Af Amer: 36 mL/min — ABNORMAL LOW (ref >60)
Glucose, Bld: 85 mg/dL (ref 70–99)
Potassium: 4 mmol/L (ref 3.5–5.1)
Sodium: 143 mmol/L (ref 135–145)
Total Bilirubin: 0.4 mg/dL (ref 0.3–1.2)
Total Protein: 6.9 g/dL (ref 6.5–8.1)

## 2019-12-29 NOTE — Telephone Encounter (Signed)
Scheduled per 7/15 los. Printed avs and calendar for pt. 

## 2020-01-23 MED FILL — IMATINIB MESYLATE 400 MG TA: 400 | 30 days supply | Qty: 30 | Fill #2

## 2020-02-23 MED FILL — IMATINIB MESYLATE 400 MG TA: 400 | 30 days supply | Qty: 30 | Fill #3

## 2020-03-19 MED FILL — IMATINIB MESYLATE 400 MG TA: 400 | 30 days supply | Qty: 30 | Fill #4

## 2020-03-28 NOTE — Progress Notes (Signed)
Haley Mcmillan   [phone] [phone]   Clinic Follow up Note   Patient Care Team: Hoyt Koch, MD as PCP - General (Internal Medicine) Loletha Carrow, Kirke Corin, MD as Consulting Physician (Gastroenterology) Truitt Merle, MD as Consulting Physician (Hematology)  Date of Service:  03/30/2020  CHIEF COMPLAINT: F/u for GIST  SUMMARY OF ONCOLOGIC HISTORY: Oncology History Overview Note  Malignant gastrointestinal stromal tumor (GIST) of stomach ([place])   Staging form: Gastric Stromal Tumor - Gastric Gist, AJCC 7th Edition   - Clinical stage from 04/09/2016: Stage IV (T3, N0, M1, Mitotic rate: Low) - Signed by Truitt Merle, MD on 04/20/2016    Malignant gastrointestinal stromal tumor (GIST) of stomach ([place])  04/09/2016 Initial Diagnosis   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   04/09/2016 Initial Biopsy   Gastric mass biopsy showed gastrointestinal stromal tumor, I feel mitosis and necrosis, intermediate to high-grade.   04/09/2016 Imaging   CT chest, abdomen and pelvis with contrast showed a large exophytic mass in the proximal lateral stomach measuring 10 x 7.4 cm, nodular lesions within omentum and multiple liver nodules are highly suspicious for metastatic disease.   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 Today's study demonstrates a positive response to therapy with regression of the primary gastric neoplasm, as well as regression of numerous intraperitoneal metastatic lesions. Additionally, the  liver lesions seen on the prior study appear far more well-defined, decreased in size and are much lower attenuation, suggesting internal areas of necrosis. 2. Several small pulmonary nodules are noted, some of which are new compared to the prior examination. These are nonspecific, but metastatic disease to the lungs is not excluded. Attention on follow-up imaging is recommended. 3. Importantly, there is also widespread ground-glass attenuation and septal thickening in the lungs bilaterally which is new compared to the prior study. In the appropriate clinical setting, this could reflect a drug reaction. Other differential considerations include atypical infection, or even developing interstitial lung disease. Clinical correlation is suggested. 4. Aortic atherosclerosis, in addition to right coronary artery disease. 5. Mild diffuse bronchial wall thickening with mild centrilobular and paraseptal emphysema; imaging findings suggestive of underlying COPD. 6. Additional incidental findings, as above.   09/22/2016 Imaging    CT CAP W CONTRAST   IMPRESSION: 1. Nodular appearing GI stromal tumor along the posterior gastric wall with associated peritoneal implants and hepatic metastases, stable from 07/16/2016. 2. Improving mid and lower lung zone predominant peribronchovascular ground-glass, possibly related to drug therapy. 3. A few scattered pulmonary nodules are stable. 4. Aortic atherosclerosis (ICD10-170.0). Coronary artery calcification.   01/14/2017 Imaging   CT CAP w Contrast  IMPRESSION: 1. Further decrease in size of large GI stromal tumor arising from the greater curvature of the stomach with improvement in hepatic and multifocal peritoneal metastases. 2. Further improvement in lower lung predominant peribronchovascular ground-glass density, probably inflammatory (drug reaction or or infectious). 3. No acute findings. 4.  Aortic Atherosclerosis (ICD10-I70.0).     05/08/2017 Imaging   IMPRESSION: 1. Overall, today's examination is  very similar to recent prior studies again demonstrating a large proximal gastric mass with adjacent gastrosplenic ligament lymphadenopathy, multiple treated lesions in the liver, and other intraperitoneal metastases, as discussed above. No definite new sites of metastatic disease are otherwise noted. 2. Aortic atherosclerosis, in addition to right coronary artery disease. 3. Mild cardiomegaly. 4. There are calcifications of the aortic valve. Echocardiographic correlation for evaluation of potential valvular dysfunction may be warranted if clinically indicated. 5. Additional incidental findings, as above. Aortic Atherosclerosis (ICD10-I70.0).   09/07/2017 Imaging   CT CAP W Contrast 09/07/17 IMPRESSION: 1. Large mass extending off the greater curvature of the stomach is similar to prior. Majority of nodularity within the adjacent mesentery is similar. There is one adjacent nodule which has increased in size when compared to prior exam. 2. Grossly unchanged treated hepatic lesions.   01/04/2018 Imaging   CT CAP WO COntrast 01/04/18 IMPRESSION: 1. No substantial interval change in exam. 2. Irregular mass at the proximal stomach is similar to prior. Scattered soft tissue lesions in the perigastric reason, omentum, and mesentery are similar to prior. No generalized trend towards worsening or improving disease. No new or progressive findings on today's study. 3. Stable appearance multiple hepatic lesions. 4.  Aortic Atherosclerois (ICD10-170.0)   05/05/2018 Imaging   CT CAP 05/05/18  IMPRESSION: 1. Mild response to therapy, as evidenced by decreased size of many soft tissue lesions along the greater curvature of the stomach, as well as within the omentum. 2. Similar hepatic metastasis. 3.  No acute process or evidence of metastatic disease in the chest. 4.  Aortic Atherosclerosis (ICD10-I70.0). 5. Pulmonary  artery enlargement suggests pulmonary arterial hypertension.   09/28/2018 Imaging   CT CAP 09/28/18  IMPRESSION: 1. No change in soft tissue nodularity about the greater curvature of the stomach and pancreatic tail (series 2, image 53).  2. No change in noncontrast appearance of numerous small hypodense metastatic lesions of the liver.  3. New 4 mm irregular pulmonary nodule of the right pulmonary apex (series 6, image 32), nonspecific. Attention on follow-up. Additional stable small pulmonary nodules and ground-glass opacities, for example 3 mm adjacent nodules of the right upper lobe (series 6, image 51).   03/29/2019 Imaging   CT CAP WO Contrast  IMPRESSION: 1. Calcified nodular soft tissue seen along the posterior stomach and tail of pancreas is stable in the interval. No new or progressive interval findings. 2. Numerous low-density liver lesions compatible with metastatic disease. Overall appearance is stable. 3. 4 mm irregular right apical pulmonary nodule identified previously as new is unchanged in the interval. Continued attention on follow-up recommended. 4.  Aortic Atherosclerois (ICD10-170.0)   09/26/2019 Imaging   CT CAP w Contrast  IMPRESSION: Chest Impression:   1. No evidence of thoracic metastasis.   Abdomen / Pelvis Impression:   1. Stable serosal nodularity along the greater curvature of the stomach. 2. Stable small round low-density lesions in liver. No significant change from CT 07/16/2016. 3. No evidence disease progression the abdomen or pelvis.      CURRENT THERAPY:  Gleevec $Remove'400mg'eZqxHfJ$  daily starting 04/22/16  INTERVAL HISTORY:  Haley Mcmillan is here for a follow up of GIST. She presents to the clinic alone. She notes she is doing well. She notes her appetite is low and eats 2 meals a day. She has lost a 1-2 pounds since last visit. She denies any major changes recently. She notes she rather her cell phone be called in order to reach her.  She  is open to Flu shot and Moderna booster vaccine.    REVIEW OF SYSTEMS:   Constitutional: Denies fevers, chills or abnormal weight loss Eyes: Denies blurriness of vision Ears, nose, mouth, throat, and face: Denies mucositis or sore throat Respiratory: Denies cough, dyspnea or wheezes Cardiovascular: Denies palpitation, chest discomfort or lower extremity swelling Gastrointestinal:  Denies nausea, heartburn or change in bowel habits Skin: Denies abnormal skin rashes Lymphatics: Denies new lymphadenopathy or easy bruising Neurological:Denies numbness, tingling or new weaknesses Behavioral/Psych: Mood is stable, no new changes  All other systems were reviewed with the patient and are negative.  MEDICAL HISTORY:  Past Medical History:  Diagnosis Date  . Anemia   . Arthritis   . Colon polyps   . GIST DX'D  03/2016  . Olecranon bursitis    Elbow  . Other and unspecified hyperlipidemia   . Palpitations   . Unspecified essential hypertension     SURGICAL HISTORY: Past Surgical History:  Procedure Laterality Date  . COLONOSCOPY W/ POLYPECTOMY  01/2004  . CRYOTHERAPY     female-cryosurgery stated by pt  . ESOPHAGOGASTRODUODENOSCOPY N/A 04/09/2016   Procedure: ESOPHAGOGASTRODUODENOSCOPY (EGD);  Surgeon: Doran Stabler, MD;  Location: [facility] ENDOSCOPY;  Service: Endoscopy;  Laterality: N/A;  . POLYPECTOMY     from vocal surgery    I have reviewed the social history and family history with the patient and they are unchanged from previous note.  ALLERGIES:  has No Known Allergies.  MEDICATIONS:  Current Outpatient Medications  Medication Sig Dispense Refill  . amLODipine (NORVASC) 5 MG tablet TAKE 1 TABLET(5 MG) BY MOUTH DAILY 90 tablet 2  . Blood Pressure Monitoring (BLOOD PRESSURE CUFF) MISC Measure BP daily  Dx: hypertension 1 each 0  . Cholecalciferol (VITAMIN D3) 5000 units CAPS Take 5,000 Units by mouth daily.     . Cyanocobalamin (VITAMIN B 12 PO) Take 1 tablet by mouth  daily.    Marland Kitchen imatinib (GLEEVEC) 400 MG tablet Take 1 tablet (400 mg total) by mouth daily. Take with meals and large glass of water.Caution:Chemotherapy. 30 tablet 5  . lisinopril (ZESTRIL) 20 MG tablet TAKE 1 TABLET(20 MG) BY MOUTH DAILY 90 tablet 3   No current facility-administered medications for this visit.    PHYSICAL EXAMINATION: ECOG PERFORMANCE STATUS: 1 - Symptomatic but completely ambulatory  Vitals:   03/30/20 1335  BP: (!) 172/78  Pulse: 72  Resp: 16  Temp: (!) 97.3 F (36.3 C)  SpO2: 100%   Filed Weights   03/30/20 1335  Weight: 160 lb 1.6 oz (72.6 kg)    GENERAL:alert, no distress and comfortable SKIN: skin color, texture, turgor are normal, no rashes or significant lesions EYES: normal, Conjunctiva are pink and non-injected, sclera clear  NECK: supple, thyroid normal size, non-tender, without nodularity LYMPH:  no palpable lymphadenopathy in the cervical, axillary  LUNGS: clear to auscultation and percussion with normal breathing effort HEART: regular rate & rhythm and no murmurs (+) Mild lower extremity edema in ankles  ABDOMEN:abdomen soft, non-tender and normal bowel sounds Musculoskeletal:no cyanosis of digits and no clubbing  NEURO: alert & oriented x 3 with fluent speech, no focal motor/sensory deficits  LABORATORY DATA:  I have reviewed the data as listed CBC Latest Ref Rng & Units 03/30/2020 12/29/2019 09/26/2019  WBC 4.0 - 10.5 K/uL 4.8 4.4 4.6  Hemoglobin 12.0 - 15.0 g/dL 11.3(L) 11.6(L) 12.1  Hematocrit 36 - 46 % 33.7(L) 34.2(L) 36.4  Platelets 150 - 400 K/uL 186 152 149(L)  CMP Latest Ref Rng & Units 03/30/2020 12/29/2019 09/26/2019  Glucose 70 - 99 mg/dL 91 85 83  BUN 8 - 23 mg/dL $Remove'9 9 9  'CFhZSjp$ Creatinine 0.44 - 1.00 mg/dL 1.22(H) 1.36(H) 1.22(H)  Sodium 135 - 145 mmol/L 142 143 145  Potassium 3.5 - 5.1 mmol/L 3.6 4.0 3.6  Chloride 98 - 111 mmol/L 111 110 113(H)  CO2 22 - 32 mmol/L $RemoveB'25 24 22  'gYAIgbek$ Calcium 8.9 - 10.3 mg/dL 9.1 9.1 8.9  Total Protein  6.5 - 8.1 g/dL 7.1 6.9 7.0  Total Bilirubin 0.3 - 1.2 mg/dL 0.3 0.4 0.4  Alkaline Phos 38 - 126 U/L 65 59 57  AST 15 - 41 U/L $Remo'18 22 20  'gORby$ ALT 0 - 44 U/L $Remo'8 10 8      'QApHm$ RADIOGRAPHIC STUDIES: I have personally reviewed the radiological images as listed and agreed with the findings in the report. No results found.   ASSESSMENT & PLAN:  Haley Mcmillan is a 86 y.o. female with    1. Malignant gastrointestinal stromal tumor (GIST) of stomach, with liver and peritoneal metastasis -Diagnosed in 03/2016 andhas beentreated with [place] $RemoveBefore'400mg'HUNkmkdkFiSqR$  daily since 04/22/16.Toleratingwell.  -Her restaging scans have showed great response to [place]. -She is clinically doing well. Lab reviewed, her CBC and CMP are within normal limits except Hg stable at 11.3 and Cr 1.22. Her physical exam was unremarkable. There is no clinical concern for disease progression -Will continue Gleevec $RemoveBeforeDE'400mg'QIxchpZbjSPihLT$  daily, tolerating well.  -For her mild LE edema, I recommend she elevated her feet when sitting down.  -Given she is doing so well will repeat her CT scan every 9 months, next in 05/2020 -F/u in 11 weeks, last week of December.    2. Anemia, iron deficiency  -likely related to [place]. -Remains mild and stable. Continue oral ferous sulfate once daily.  3. HTN -Currently onamlodipine 5 mg and lisinopril $RemoveBefore'20mg'YKaWvrKJWGojc$  daily, managed by PCP  4. Pulmonary nodules  -They were stableon 09/28/18 CT CAP. Also showsher new pulmonary nodule is very small 26mm, which is indeterminate, no highly suspicious forlungmets. -Unchanged on 09/26/19 CT CAP.Will continue monitoring.  5. CKD, Stage III -I advised her to avoid regular use of Advil orAleve -Cr does continue to fluctuate from 1.20-1.55. Continue to drink 36-40 ounces a day.She has cut back on sodas.  Plan -flu shot today -F/u in the last week of Dec with lab and CT AP w contrast a few days before  -Pt prefers cell phone for her best contact.  -Continue  Gleevec $Remove'400mg'wTaEvjP$  daily, I will refill in 2 weeks.    No problem-specific Assessment & Plan notes found for this encounter.   Orders Placed This Encounter  Procedures  . CT Abdomen Pelvis W Contrast    Standing Status:   Future    Standing Expiration Date:   03/30/2021    Order Specific Question:   If indicated for the ordered procedure, I authorize the administration of contrast media per Radiology protocol    Answer:   Yes    Order Specific Question:   Preferred imaging location?    Answer:   [facility]    Order Specific Question:   Release to patient    Answer:   Immediate    Order Specific Question:   Is Oral Contrast requested for this exam?    Answer:   Yes, Per Radiology protocol   All questions were answered. The patient knows to call the clinic with any problems, questions or concerns. No  barriers to learning was detected. The total time spent in the appointment was 30 minutes.     Truitt Merle, MD 03/30/2020   I, Joslyn Devon, am acting as scribe for Truitt Merle, MD.   I have reviewed the above documentation for accuracy and completeness, and I agree with the above.

## 2020-03-30 ENCOUNTER — Other Ambulatory Visit: Payer: Self-pay

## 2020-03-30 ENCOUNTER — Encounter: Payer: Self-pay | Admitting: Hematology

## 2020-03-30 ENCOUNTER — Inpatient Hospital Stay: Payer: Medicare PPO

## 2020-03-30 ENCOUNTER — Telehealth: Payer: Self-pay | Admitting: Hematology

## 2020-03-30 ENCOUNTER — Inpatient Hospital Stay: Payer: Medicare PPO | Attending: Hematology | Admitting: Hematology

## 2020-03-30 VITALS — BP 172/78 | HR 72 | Temp 97.3°F | Resp 16 | Ht 67.0 in | Wt 160.1 lb

## 2020-03-30 DIAGNOSIS — C786 Secondary malignant neoplasm of retroperitoneum and peritoneum: Secondary | ICD-10-CM | POA: Diagnosis not present

## 2020-03-30 DIAGNOSIS — R600 Localized edema: Secondary | ICD-10-CM | POA: Diagnosis not present

## 2020-03-30 DIAGNOSIS — C49A2 Gastrointestinal stromal tumor of stomach: Secondary | ICD-10-CM | POA: Diagnosis not present

## 2020-03-30 DIAGNOSIS — I10 Essential (primary) hypertension: Secondary | ICD-10-CM

## 2020-03-30 DIAGNOSIS — D509 Iron deficiency anemia, unspecified: Secondary | ICD-10-CM | POA: Insufficient documentation

## 2020-03-30 DIAGNOSIS — I129 Hypertensive chronic kidney disease with stage 1 through stage 4 chronic kidney disease, or unspecified chronic kidney disease: Secondary | ICD-10-CM | POA: Diagnosis not present

## 2020-03-30 DIAGNOSIS — N183 Chronic kidney disease, stage 3 unspecified: Secondary | ICD-10-CM | POA: Insufficient documentation

## 2020-03-30 DIAGNOSIS — Z23 Encounter for immunization: Secondary | ICD-10-CM | POA: Insufficient documentation

## 2020-03-30 DIAGNOSIS — R918 Other nonspecific abnormal finding of lung field: Secondary | ICD-10-CM | POA: Diagnosis not present

## 2020-03-30 DIAGNOSIS — C787 Secondary malignant neoplasm of liver and intrahepatic bile duct: Secondary | ICD-10-CM | POA: Diagnosis not present

## 2020-03-30 DIAGNOSIS — Z79899 Other long term (current) drug therapy: Secondary | ICD-10-CM | POA: Insufficient documentation

## 2020-03-30 LAB — CBC WITH DIFFERENTIAL/PLATELET
Abs Immature Granulocytes: 0 K/uL (ref 0.00–0.07)
Basophils Absolute: 0.1 K/uL (ref 0.0–0.1)
Basophils Relative: 1 %
Eosinophils Absolute: 0.3 K/uL (ref 0.0–0.5)
Eosinophils Relative: 5 %
HCT: 33.7 % — ABNORMAL LOW (ref 36.0–46.0)
Hemoglobin: 11.3 g/dL — ABNORMAL LOW (ref 12.0–15.0)
Immature Granulocytes: 0 %
Lymphocytes Relative: 32 %
Lymphs Abs: 1.6 K/uL (ref 0.7–4.0)
MCH: 36.3 pg — ABNORMAL HIGH (ref 26.0–34.0)
MCHC: 33.5 g/dL (ref 30.0–36.0)
MCV: 108.4 fL — ABNORMAL HIGH (ref 80.0–100.0)
Monocytes Absolute: 0.4 K/uL (ref 0.1–1.0)
Monocytes Relative: 8 %
Neutro Abs: 2.6 K/uL (ref 1.7–7.7)
Neutrophils Relative %: 54 %
Platelets: 186 K/uL (ref 150–400)
RBC: 3.11 MIL/uL — ABNORMAL LOW (ref 3.87–5.11)
RDW: 12.6 % (ref 11.5–15.5)
WBC: 4.8 K/uL (ref 4.0–10.5)
nRBC: 0 % (ref 0.0–0.2)

## 2020-03-30 LAB — COMPREHENSIVE METABOLIC PANEL
ALT: 8 U/L (ref 0–44)
AST: 18 U/L (ref 15–41)
Albumin: 3.8 g/dL (ref 3.5–5.0)
Alkaline Phosphatase: 65 U/L (ref 38–126)
Anion gap: 6 (ref 5–15)
BUN: 9 mg/dL (ref 8–23)
CO2: 25 mmol/L (ref 22–32)
Calcium: 9.1 mg/dL (ref 8.9–10.3)
Chloride: 111 mmol/L (ref 98–111)
Creatinine, Ser: 1.22 mg/dL — ABNORMAL HIGH (ref 0.44–1.00)
GFR, Estimated: 41 mL/min — ABNORMAL LOW (ref >60)
Glucose, Bld: 91 mg/dL (ref 70–99)
Potassium: 3.6 mmol/L (ref 3.5–5.1)
Sodium: 142 mmol/L (ref 135–145)
Total Bilirubin: 0.3 mg/dL (ref 0.3–1.2)
Total Protein: 7.1 g/dL (ref 6.5–8.1)

## 2020-03-30 MED ORDER — INFLUENZA VAC A&B SA ADJ QUAD 0.5 ML IM PRSY
PREFILLED_SYRINGE | INTRAMUSCULAR | Status: AC
  Filled 2020-03-30: qty 0.5

## 2020-03-30 MED ORDER — INFLUENZA VAC A&B SA ADJ QUAD 0.5 ML IM PRSY
0.5000 mL | PREFILLED_SYRINGE | Freq: Once | INTRAMUSCULAR | Status: AC
  Administered 2020-03-30: 0.5 mL via INTRAMUSCULAR

## 2020-03-30 NOTE — Telephone Encounter (Signed)
Scheduled per 10/15 los. Printed avs and calendar for pt.

## 2020-04-24 MED FILL — IMATINIB MESYLATE 400 MG TA: 400 | 30 days supply | Qty: 30 | Fill #5

## 2020-04-30 ENCOUNTER — Other Ambulatory Visit: Payer: Self-pay

## 2020-04-30 ENCOUNTER — Telehealth: Payer: Self-pay | Admitting: Internal Medicine

## 2020-04-30 MED ORDER — AMLODIPINE BESYLATE 5 MG PO TABS: ORAL_TABLET | ORAL | 2 refills | Status: DC

## 2020-04-30 MED ORDER — LISINOPRIL 20 MG PO TABS: ORAL_TABLET | ORAL | 3 refills | Status: DC

## 2020-04-30 NOTE — Telephone Encounter (Signed)
1.Medication Requested: amLODipine (NORVASC) 5 MG tablet (pt completely out of this med)  lisinopril (ZESTRIL) 20 MG tablet  2. Pharmacy (Name, Street, [facility]): [address], [place] Phone:  [phone]  Fax:  [phone]       3. On Med List: Y  4. Last Visit with PCP: 07/27/19  5. Next visit date with PCP: 11.24.21   Agent: Please be advised that RX refills may take up to 3 business days. We ask that you follow-up with your pharmacy.

## 2020-05-09 ENCOUNTER — Encounter (INDEPENDENT_AMBULATORY_CARE_PROVIDER_SITE_OTHER): Payer: Self-pay

## 2020-05-09 ENCOUNTER — Ambulatory Visit (INDEPENDENT_AMBULATORY_CARE_PROVIDER_SITE_OTHER): Payer: Medicare PPO | Admitting: Internal Medicine

## 2020-05-09 ENCOUNTER — Other Ambulatory Visit: Payer: Self-pay

## 2020-05-09 ENCOUNTER — Encounter: Payer: Self-pay | Admitting: Internal Medicine

## 2020-05-09 VITALS — BP 134/70 | HR 88 | Temp 98.6°F | Ht 67.0 in | Wt 158.8 lb

## 2020-05-09 DIAGNOSIS — R140 Abdominal distension (gaseous): Secondary | ICD-10-CM | POA: Diagnosis not present

## 2020-05-09 MED ORDER — PANTOPRAZOLE SODIUM 40 MG PO TBEC
40.0000 mg | DELAYED_RELEASE_TABLET | Freq: Every day | ORAL | 3 refills | Status: DC
Start: 2020-05-09 — End: 2020-07-11

## 2020-05-09 NOTE — Assessment & Plan Note (Signed)
Try protonix and if no improvement labs Monday including cbc, cmp, lipase.

## 2020-05-09 NOTE — Patient Instructions (Signed)
We will send in the protonix to help with the pressure and pain.  We are checking the blood work today.

## 2020-05-09 NOTE — Progress Notes (Signed)
   Subjective:   Patient ID: Haley Mcmillan, female    DOB: 18-Jun-1933, 86 y.o.   MRN: [id number]  HPI The patient is an 86 YO female coming in for concerns about heartburn and pain/bloating with eating. Started in the last 2 weeks. Denies weight loss. Appetite is poor due to concern about pain and bloating with eating. Denies trying anything for this. Denies blood in stool. Had some constipation at the onset. Several good BM in the last day and maybe partial relief. History of GIST and due for imaging in mid December.   Review of Systems  Constitutional: Negative.   HENT: Negative.   Eyes: Negative.   Respiratory: Negative for cough, chest tightness and shortness of breath.   Cardiovascular: Negative for chest pain, palpitations and leg swelling.  Gastrointestinal: Positive for abdominal distention, abdominal pain and constipation. Negative for anal bleeding, blood in stool, diarrhea, nausea, rectal pain and vomiting.  Musculoskeletal: Negative.   Skin: Negative.   Neurological: Negative.   Psychiatric/Behavioral: Negative.     Objective:  Physical Exam Constitutional:      Appearance: She is well-developed.  HENT:     Head: Normocephalic and atraumatic.  Cardiovascular:     Rate and Rhythm: Normal rate and regular rhythm.  Pulmonary:     Effort: Pulmonary effort is normal. No respiratory distress.     Breath sounds: Normal breath sounds. No wheezing or rales.  Abdominal:     General: Bowel sounds are normal. There is distension.     Palpations: Abdomen is soft.     Tenderness: There is no abdominal tenderness. There is no rebound.     Comments: bloating  Musculoskeletal:     Cervical back: Normal range of motion.  Skin:    General: Skin is warm and dry.  Neurological:     Mental Status: She is alert and oriented to person, place, and time.     Coordination: Coordination normal.     Vitals:   05/09/20 1103  BP: 134/70  Pulse: 88  Temp: 98.6 F (37 C)  TempSrc:  Oral  SpO2: 99%  Weight: 158 lb 12.8 oz (72 kg)  Height: 5\' 7"  (1.702 m)    This visit occurred during the SARS-CoV-2 public health emergency.  Safety protocols were in place, including screening questions prior to the visit, additional usage of staff PPE, and extensive cleaning of exam room while observing appropriate contact time as indicated for disinfecting solutions.   Assessment & Plan:

## 2020-05-14 ENCOUNTER — Telehealth: Payer: Self-pay | Admitting: Internal Medicine

## 2020-05-14 NOTE — Telephone Encounter (Signed)
   Patient states she took pantoprazole (PROTONIX) 40 MG tablet for 3 days then stopped. She was concerned about possible side effects. She states her stomach is bloated.  She wants advice for taking a laxative

## 2020-05-15 NOTE — Telephone Encounter (Signed)
Did the medicine help her abdominal pain symptoms? If so I would recommend to continue this for 2-3 weeks.

## 2020-05-15 NOTE — Telephone Encounter (Signed)
Miralax is a laxative and can take this to help.

## 2020-05-15 NOTE — Telephone Encounter (Signed)
Patient has been notified that taking miralax is fine.

## 2020-05-16 ENCOUNTER — Other Ambulatory Visit: Payer: Self-pay | Admitting: Hematology

## 2020-05-16 DIAGNOSIS — C49A2 Gastrointestinal stromal tumor of stomach: Secondary | ICD-10-CM

## 2020-05-21 ENCOUNTER — Telehealth: Payer: Self-pay | Admitting: Internal Medicine

## 2020-05-21 NOTE — Telephone Encounter (Signed)
Patient called and said that she is still bloated and wanted to ask Dr. Sharlet Salina what else she can do to help with the bloating. She can be reached at [phone]

## 2020-05-21 NOTE — Telephone Encounter (Signed)
I would recommend her to follow the plan from the visit to take protonix.

## 2020-05-22 NOTE — Telephone Encounter (Signed)
Pt informed of below.  

## 2020-05-23 MED FILL — IMATINIB MESYLATE 400 MG TA: 400 | 30 days supply | Qty: 30 | Fill #0

## 2020-05-29 DIAGNOSIS — H532 Diplopia: Secondary | ICD-10-CM | POA: Diagnosis not present

## 2020-05-29 DIAGNOSIS — H2513 Age-related nuclear cataract, bilateral: Secondary | ICD-10-CM | POA: Diagnosis not present

## 2020-05-29 DIAGNOSIS — H43811 Vitreous degeneration, right eye: Secondary | ICD-10-CM | POA: Diagnosis not present

## 2020-06-01 NOTE — Telephone Encounter (Signed)
Patient calling stating she is still having bloating, she said its no pain just discomfort and swelling. She wasn't sure if it should have gone down by now. Patient states she does not like to eat much because it makes the bloating worse.

## 2020-06-01 NOTE — Telephone Encounter (Signed)
Did she ever try protonix as advised for 1-2 weeks daily?

## 2020-06-04 NOTE — Telephone Encounter (Signed)
We can get her in with GI if she wants but recommend small frequent meals to help with bloating and can use gas-x over the counter with meals to help also.

## 2020-06-04 NOTE — Telephone Encounter (Signed)
Pt informed of below.  She wants to continue with smaller meals , gas-x and pantoprazole first.

## 2020-06-04 NOTE — Telephone Encounter (Signed)
I called pt - she states she has been taking protonix every day as advised below.

## 2020-06-12 ENCOUNTER — Other Ambulatory Visit: Payer: Medicare PPO

## 2020-06-12 ENCOUNTER — Other Ambulatory Visit: Payer: Self-pay

## 2020-06-12 ENCOUNTER — Other Ambulatory Visit: Payer: Self-pay | Admitting: Hematology

## 2020-06-12 ENCOUNTER — Ambulatory Visit (HOSPITAL_COMMUNITY)
Admission: RE | Admit: 2020-06-12 | Discharge: 2020-06-12 | Disposition: A | Discharge status: Home / Self Care | Payer: Medicare PPO | Source: Ambulatory Visit | Attending: Hematology | Admitting: Hematology

## 2020-06-12 ENCOUNTER — Inpatient Hospital Stay: Payer: Medicare PPO | Attending: Hematology

## 2020-06-12 DIAGNOSIS — K449 Diaphragmatic hernia without obstruction or gangrene: Secondary | ICD-10-CM | POA: Diagnosis not present

## 2020-06-12 DIAGNOSIS — C49A2 Gastrointestinal stromal tumor of stomach: Secondary | ICD-10-CM | POA: Insufficient documentation

## 2020-06-12 DIAGNOSIS — R188 Other ascites: Secondary | ICD-10-CM | POA: Diagnosis not present

## 2020-06-12 DIAGNOSIS — I700 Atherosclerosis of aorta: Secondary | ICD-10-CM | POA: Diagnosis not present

## 2020-06-12 DIAGNOSIS — C49A0 Gastrointestinal stromal tumor, unspecified site: Secondary | ICD-10-CM | POA: Diagnosis not present

## 2020-06-12 LAB — CBC WITH DIFFERENTIAL/PLATELET
Abs Immature Granulocytes: 0.02 10*3/uL (ref 0.00–0.07)
Basophils Absolute: 0 10*3/uL (ref 0.0–0.1)
Basophils Relative: 1 %
Eosinophils Absolute: 0.3 10*3/uL (ref 0.0–0.5)
Eosinophils Relative: 5 %
HCT: 27.9 % — ABNORMAL LOW (ref 36.0–46.0)
Hemoglobin: 9.2 g/dL — ABNORMAL LOW (ref 12.0–15.0)
Immature Granulocytes: 0 %
Lymphocytes Relative: 21 %
Lymphs Abs: 1.2 10*3/uL (ref 0.7–4.0)
MCH: 35.9 pg — ABNORMAL HIGH (ref 26.0–34.0)
MCHC: 33 g/dL (ref 30.0–36.0)
MCV: 109 fL — ABNORMAL HIGH (ref 80.0–100.0)
Monocytes Absolute: 0.4 10*3/uL (ref 0.1–1.0)
Monocytes Relative: 7 %
Neutro Abs: 3.7 10*3/uL (ref 1.7–7.7)
Neutrophils Relative %: 66 %
Platelets: 206 10*3/uL (ref 150–400)
RBC: 2.56 MIL/uL — ABNORMAL LOW (ref 3.87–5.11)
RDW: 14.2 % (ref 11.5–15.5)
WBC: 5.6 10*3/uL (ref 4.0–10.5)
nRBC: 0 % (ref 0.0–0.2)

## 2020-06-12 LAB — COMPREHENSIVE METABOLIC PANEL
ALT: 7 U/L (ref 0–44)
AST: 20 U/L (ref 15–41)
Albumin: 3.4 g/dL — ABNORMAL LOW (ref 3.5–5.0)
Alkaline Phosphatase: 50 U/L (ref 38–126)
Anion gap: 8 (ref 5–15)
BUN: 13 mg/dL (ref 8–23)
CO2: 25 mmol/L (ref 22–32)
Calcium: 9.1 mg/dL (ref 8.9–10.3)
Chloride: 112 mmol/L — ABNORMAL HIGH (ref 98–111)
Creatinine, Ser: 1.75 mg/dL — ABNORMAL HIGH (ref 0.44–1.00)
GFR, Estimated: 28 mL/min — ABNORMAL LOW (ref >60)
Glucose, Bld: 94 mg/dL (ref 70–99)
Potassium: 3.5 mmol/L (ref 3.5–5.1)
Sodium: 145 mmol/L (ref 135–145)
Total Bilirubin: 0.5 mg/dL (ref 0.3–1.2)
Total Protein: 7 g/dL (ref 6.5–8.1)

## 2020-06-12 NOTE — Progress Notes (Signed)
error 

## 2020-06-14 ENCOUNTER — Ambulatory Visit: Payer: Medicare PPO | Admitting: Hematology

## 2020-06-18 ENCOUNTER — Telehealth: Payer: Self-pay | Admitting: Hematology

## 2020-06-18 NOTE — Telephone Encounter (Signed)
Rescheduled appts per 1/3 sch msg. Pt confirmed appt date/time.

## 2020-06-18 NOTE — Progress Notes (Signed)
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   04/09/2016 Initial Biopsy   Gastric mass biopsy showed gastrointestinal stromal tumor, I feel mitosis and necrosis, intermediate to high-grade.   04/09/2016 Imaging   CT chest, abdomen and pelvis with contrast showed a large exophytic mass in the proximal lateral stomach measuring 10 x 7.4 cm, nodular lesions within omentum and multiple liver nodules are highly suspicious for metastatic disease.   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 Today's study demonstrates a positive response to therapy with regression of the primary gastric neoplasm, as well as regression of numerous intraperitoneal metastatic lesions. Additionally, the  liver lesions seen on the prior study appear far more well-defined, decreased in size and are much lower attenuation, suggesting internal areas of necrosis. 2. Several small pulmonary nodules are noted, some of which are new compared to the prior examination. These are nonspecific, but metastatic disease to the lungs is not excluded. Attention on follow-up imaging is recommended. 3. Importantly, there is also widespread ground-glass attenuation and septal thickening in the lungs bilaterally which is new compared to the prior study. In the appropriate clinical setting, this could reflect a drug reaction. Other differential considerations include atypical infection, or even developing interstitial lung disease. Clinical correlation is suggested. 4. Aortic atherosclerosis, in addition to right coronary artery disease. 5. Mild diffuse bronchial wall thickening with mild centrilobular and paraseptal emphysema; imaging findings suggestive of underlying COPD. 6. Additional incidental findings, as above.   09/22/2016 Imaging    CT CAP W CONTRAST   IMPRESSION: 1. Nodular appearing GI stromal tumor along the posterior gastric wall with associated peritoneal implants and hepatic metastases, stable from 07/16/2016. 2. Improving mid and lower lung zone predominant peribronchovascular ground-glass, possibly related to drug therapy. 3. A few scattered pulmonary nodules are stable. 4. Aortic atherosclerosis (ICD10-170.0). Coronary artery calcification.   01/14/2017 Imaging   CT CAP w Contrast  IMPRESSION: 1. Further decrease in size of large GI stromal tumor arising from the greater curvature of the stomach with improvement in hepatic and multifocal peritoneal metastases. 2. Further improvement in lower lung predominant peribronchovascular ground-glass density, probably inflammatory (drug reaction or or infectious). 3. No acute findings. 4.  Aortic Atherosclerosis (ICD10-I70.0).    05/08/2017 Imaging   IMPRESSION: 1. Overall, today's examination is very  similar to recent prior studies again demonstrating a large proximal gastric mass with adjacent gastrosplenic ligament lymphadenopathy, multiple treated lesions in the liver, and other intraperitoneal metastases, as discussed above. No definite new sites of metastatic disease are otherwise noted. 2. Aortic atherosclerosis, in addition to right coronary artery disease. 3. Mild cardiomegaly. 4. There are calcifications of the aortic valve. Echocardiographic correlation for evaluation of potential valvular dysfunction may be warranted if clinically indicated. 5. Additional incidental findings, as above. Aortic Atherosclerosis (ICD10-I70.0).   09/07/2017 Imaging   CT CAP W Contrast 09/07/17 IMPRESSION: 1. Large mass extending off the greater curvature of the stomach is similar to prior. Majority of nodularity within the adjacent mesentery is similar. There is one adjacent nodule which has increased in size when compared to prior exam. 2. Grossly unchanged treated hepatic lesions.   01/04/2018 Imaging   CT CAP WO COntrast 01/04/18 IMPRESSION: 1. No substantial interval change in exam. 2. Irregular mass at the proximal stomach is similar to prior. Scattered soft tissue lesions in the perigastric reason, omentum, and mesentery are similar to prior. No generalized trend towards worsening or improving disease. No new or progressive findings on today's study. 3. Stable appearance multiple hepatic lesions. 4.  Aortic Atherosclerois (ICD10-170.0)   05/05/2018 Imaging   CT CAP 05/05/18  IMPRESSION: 1. Mild response to therapy, as evidenced by decreased size of many soft tissue lesions along the greater curvature of the stomach, as well as within the omentum. 2. Similar hepatic metastasis. 3.  No acute process or evidence of metastatic disease in the chest. 4.  Aortic Atherosclerosis (ICD10-I70.0). 5. Pulmonary  artery enlargement suggests pulmonary arterial hypertension.   09/28/2018 Imaging   CT CAP 09/28/18  IMPRESSION: 1. No change in soft tissue nodularity about the greater curvature of the stomach and pancreatic tail (series 2, image 53).  2. No change in noncontrast appearance of numerous small hypodense metastatic lesions of the liver.  3. New 4 mm irregular pulmonary nodule of the right pulmonary apex (series 6, image 32), nonspecific. Attention on follow-up. Additional stable small pulmonary nodules and ground-glass opacities, for example 3 mm adjacent nodules of the right upper lobe (series 6, image 51).   03/29/2019 Imaging   CT CAP WO Contrast  IMPRESSION: 1. Calcified nodular soft tissue seen along the posterior stomach and tail of pancreas is stable in the interval. No new or progressive interval findings. 2. Numerous low-density liver lesions compatible with metastatic disease. Overall appearance is stable. 3. 4 mm irregular right apical pulmonary nodule identified previously as new is unchanged in the interval. Continued attention on follow-up recommended. 4.  Aortic Atherosclerois (ICD10-170.0)   09/26/2019 Imaging   CT CAP w Contrast  IMPRESSION: Chest Impression:   1. No evidence of thoracic metastasis.   Abdomen / Pelvis Impression:   1. Stable serosal nodularity along the greater curvature of the stomach. 2. Stable small round low-density lesions in liver. No significant change from CT 07/16/2016. 3. No evidence disease progression the abdomen or pelvis.   06/12/2020 Imaging   CT AP  IMPRESSION: New large lobulated soft tissue mass in the left abdomen, consistent with recurrent malignancy or metastatic disease.   New moderate ascites. Diffuse mesenteric and body wall edema also noted. Stable nodular soft tissue densities in the gastrosplenic ligament along the greater curvature of the proximal gastric body, consistent with metastatic disease.   No  significant change in multiple small low-attenuation liver lesions.   Small hiatal hernia.   Aortic Atherosclerosis (ICD10-I70.0).  CURRENT THERAPY:  Gleevec $Remove'400mg'WUATaDZ$  daily starting 04/22/16, increase to $RemoveBef'400mg'BuMnZauDkd$  bid on 06/20/2019 due to disease progression   INTERVAL HISTORY:  Haley Mcmillan is here for a follow up of GIST. She was last seen by me 3 months ago. She presents to the clinic with her daughter. She came in a wheelchair today.  She developed abdominal bloating about 4 to 6 weeks ago, was seen by primary care physician, lab was unremarkable.  It has been worse lately, with diffuse abdominal discomfort, especially on the left side.  She also noticed decreased appetite, she is eating less.  Energy level has also dropped, she is able to do her ADLs, but much less other activities.  No fever, chills, dysuria, hematuria, or other new complaints.  All other systems were reviewed with the patient and are negative.  MEDICAL HISTORY:  Past Medical History:  Diagnosis Date  . Anemia   . Arthritis   . Colon polyps   . GIST DX'D  03/2016  . Olecranon bursitis    Elbow  . Other and unspecified hyperlipidemia   . Palpitations   . Unspecified essential hypertension     SURGICAL HISTORY: Past Surgical History:  Procedure Laterality Date  . COLONOSCOPY W/ POLYPECTOMY  01/2004  . CRYOTHERAPY     female-cryosurgery stated by pt  . ESOPHAGOGASTRODUODENOSCOPY N/A 04/09/2016   Procedure: ESOPHAGOGASTRODUODENOSCOPY (EGD);  Surgeon: Doran Stabler, MD;  Location: [facility] ENDOSCOPY;  Service: Endoscopy;  Laterality: N/A;  . POLYPECTOMY     from vocal surgery    I have reviewed the social history and family history with the patient and they are unchanged from previous note.  ALLERGIES:  has No Known Allergies.  MEDICATIONS:  Current Outpatient Medications  Medication Sig Dispense Refill  . amLODipine (NORVASC) 5 MG tablet Take 1 Tablet by mouth daily 90 tablet 2  . Blood Pressure  Monitoring (BLOOD PRESSURE CUFF) MISC Measure BP daily  Dx: hypertension 1 each 0  . Cholecalciferol (VITAMIN D3) 5000 units CAPS Take 5,000 Units by mouth daily.     . Cyanocobalamin (VITAMIN B 12 PO) Take 1 tablet by mouth daily.    Marland Kitchen imatinib (GLEEVEC) 400 MG tablet Take 1 tablet (400 mg total) by mouth 2 (two) times daily. Take with meals and large glass of water.Caution:Chemotherapy. 60 tablet 1  . lisinopril (ZESTRIL) 20 MG tablet TAKE 1 TABLET(20 MG) BY MOUTH DAILY 90 tablet 3  . pantoprazole (PROTONIX) 40 MG tablet Take 1 tablet (40 mg total) by mouth daily. 30 tablet 3   No current facility-administered medications for this visit.    PHYSICAL EXAMINATION: ECOG PERFORMANCE STATUS: 2 - Symptomatic, <50% confined to bed  Vitals:   06/19/20 1140  BP: (!) 142/62  Pulse: 85  Resp: 17  Temp: 98.1 F (36.7 C)  SpO2: 99%   Filed Weights   06/19/20 1140  Weight: 158 lb 1.6 oz (71.7 kg)    GENERAL:alert, no distress and comfortable SKIN: skin color, texture, turgor are normal, no rashes or significant lesions EYES: normal, Conjunctiva are pink and non-injected, sclera clear NECK: supple, thyroid normal size, non-tender, without nodularity LYMPH:  no palpable lymphadenopathy in the cervical, axillary  LUNGS: clear to auscultation and percussion with normal breathing effort HEART: regular rate & rhythm and no murmurs and no lower extremity edema ABDOMEN:abdomen soft, distended, there is a palpable mass in the left upper quadrant, no significant tenderness. normal bowel sounds Musculoskeletal:no cyanosis of digits and no clubbing  NEURO: alert &  oriented x 3 with fluent speech, no focal motor/sensory deficits  LABORATORY DATA:  I have reviewed the data as listed CBC Latest Ref Rng & Units 06/12/2020 03/30/2020 12/29/2019  WBC 4.0 - 10.5 K/uL 5.6 4.8 4.4  Hemoglobin 12.0 - 15.0 g/dL 9.2(L) 11.3(L) 11.6(L)  Hematocrit 36.0 - 46.0 % 27.9(L) 33.7(L) 34.2(L)  Platelets 150 - 400 K/uL  206 186 152     CMP Latest Ref Rng & Units 06/12/2020 03/30/2020 12/29/2019  Glucose 70 - 99 mg/dL 94 91 85  BUN 8 - 23 mg/dL $Remove'13 9 9  'oriqdNA$ Creatinine 0.44 - 1.00 mg/dL 1.75(H) 1.22(H) 1.36(H)  Sodium 135 - 145 mmol/L 145 142 143  Potassium 3.5 - 5.1 mmol/L 3.5 3.6 4.0  Chloride 98 - 111 mmol/L 112(H) 111 110  CO2 22 - 32 mmol/L $RemoveB'25 25 24  'uBQXXLgf$ Calcium 8.9 - 10.3 mg/dL 9.1 9.1 9.1  Total Protein 6.5 - 8.1 g/dL 7.0 7.1 6.9  Total Bilirubin 0.3 - 1.2 mg/dL 0.5 0.3 0.4  Alkaline Phos 38 - 126 U/L 50 65 59  AST 15 - 41 U/L $Remo'20 18 22  'VHsep$ ALT 0 - 44 U/L $Remo'7 8 10      'DgnjK$ RADIOGRAPHIC STUDIES: I have personally reviewed the radiological images as listed and agreed with the findings in the report. No results found.   ASSESSMENT & PLAN:  Haley Mcmillan is a 87 y.o. female with   1. Malignant gastrointestinal stromal tumor (GIST) of stomach, with liver and peritoneal metastasis -Diagnosed in 03/2016 andhas beentreated with [place] $RemoveBef'400mg'VihHhBXzwH$  daily since 04/22/16.Toleratingwell.  -Her prior restaging scans have showed great response to Gleevec. -I discussed her CT AP from 06/12/20 which showed newly developed moderate ascites, and a large 21.4 cm mass in the left upper abdomen.  She has had significant disease progression.  She is also symptomatic now with abdominal bloating, low appetite and fatigue. -I discussed the treatment options, including increased Gleevec dose to 400 mg twice daily, or second line Sutent. Since she has been tolerating Gleevec very well, and she has medicine at home, I will let her try higher dose Gleevec first -If she does not respond to high-dose Gleevec, will switch to Sutent.  I discussed the benefit and potential side effects with her.  She agrees with the plan. -We discussed that she may need paracentesis if ascites increases, this is likely malignant  -f/u in 2-3 weeks    2. Anemia, iron deficiency  -likely related to [place]. -worse now -I encouraged her to restart oral  iron pill, or multivitamins -will check iron level on next visit   3. HTN -Currently onamlodipine 5 mg and lisinopril $RemoveBefore'20mg'sjHQyryPOQUtA$  daily, managed by PCP  4. Pulmonary nodules  -They were stableon 09/28/18 CT CAP. Also showsher new pulmonary nodule is very small 49mm, which is indeterminate, no highly suspicious forlungmets. -Unchanged on4/12/21CT CAP.Will continue monitoring.  5. CKD, Stage III -I advised her to avoid regular use of Advil orAleve -Cr does continue to fluctuatefrom 1.20-1.55. Continue to drink 36-40 ounces a day.She has cut back on sodas.  Plan -Skin and lab reviewed, she has significant disease progression -We will increase Gleevec to 400 mg twice daily, I called in a new prescription to pharmacy  -f/u in 2-3 weeks  -She will call me if her abdominal bloating gets worse, she may need paracentesis.   No problem-specific Assessment & Plan notes found for this encounter.   No orders of the defined types were placed in this encounter.  All questions  were answered. The patient knows to call the clinic with any problems, questions or concerns. No barriers to learning was detected. The total time spent in the appointment was 40 minutes.     Truitt Merle, MD 06/19/2020   I, Joslyn Devon, am acting as scribe for Truitt Merle, MD.   I have reviewed the above documentation for accuracy and completeness, and I agree with the above.

## 2020-06-19 ENCOUNTER — Encounter: Payer: Self-pay | Admitting: Hematology

## 2020-06-19 ENCOUNTER — Telehealth: Payer: Self-pay | Admitting: Hematology

## 2020-06-19 ENCOUNTER — Inpatient Hospital Stay: Payer: Medicare PPO | Attending: Hematology | Admitting: Hematology

## 2020-06-19 ENCOUNTER — Other Ambulatory Visit: Payer: Self-pay | Admitting: Hematology

## 2020-06-19 ENCOUNTER — Other Ambulatory Visit: Payer: Self-pay

## 2020-06-19 DIAGNOSIS — R140 Abdominal distension (gaseous): Secondary | ICD-10-CM | POA: Insufficient documentation

## 2020-06-19 DIAGNOSIS — N183 Chronic kidney disease, stage 3 unspecified: Secondary | ICD-10-CM | POA: Insufficient documentation

## 2020-06-19 DIAGNOSIS — R918 Other nonspecific abnormal finding of lung field: Secondary | ICD-10-CM | POA: Diagnosis not present

## 2020-06-19 DIAGNOSIS — C49A2 Gastrointestinal stromal tumor of stomach: Secondary | ICD-10-CM | POA: Insufficient documentation

## 2020-06-19 DIAGNOSIS — E785 Hyperlipidemia, unspecified: Secondary | ICD-10-CM | POA: Insufficient documentation

## 2020-06-19 DIAGNOSIS — Z79899 Other long term (current) drug therapy: Secondary | ICD-10-CM | POA: Insufficient documentation

## 2020-06-19 DIAGNOSIS — D509 Iron deficiency anemia, unspecified: Secondary | ICD-10-CM | POA: Diagnosis not present

## 2020-06-19 DIAGNOSIS — C786 Secondary malignant neoplasm of retroperitoneum and peritoneum: Secondary | ICD-10-CM | POA: Diagnosis not present

## 2020-06-19 DIAGNOSIS — C787 Secondary malignant neoplasm of liver and intrahepatic bile duct: Secondary | ICD-10-CM | POA: Insufficient documentation

## 2020-06-19 DIAGNOSIS — I10 Essential (primary) hypertension: Secondary | ICD-10-CM | POA: Diagnosis not present

## 2020-06-19 MED ORDER — IMATINIB MESYLATE 400 MG PO TABS: 400.0000 mg | ORAL_TABLET | Freq: Two times a day (BID) | ORAL | 1 refills | Status: DC

## 2020-06-19 MED FILL — IMATINIB MESYLATE 400 MG TA: 400 | 30 days supply | Qty: 30 | Fill #1

## 2020-06-19 NOTE — Telephone Encounter (Signed)
scheduled appts per 1/4 los. Gave pt a print out of AVS.

## 2020-06-21 ENCOUNTER — Inpatient Hospital Stay: Payer: Medicare PPO | Admitting: Hematology

## 2020-06-21 ENCOUNTER — Other Ambulatory Visit: Payer: Medicare PPO

## 2020-07-04 ENCOUNTER — Telehealth: Payer: Self-pay

## 2020-07-04 DIAGNOSIS — R110 Nausea: Secondary | ICD-10-CM

## 2020-07-04 MED ORDER — ONDANSETRON HCL 8 MG PO TABS: 8.0000 mg | ORAL_TABLET | Freq: Three times a day (TID) | ORAL | 3 refills | Status: DC | PRN

## 2020-07-04 NOTE — Telephone Encounter (Signed)
Ms Noland's daughter came to the cancer center stating that Ms Holthaus has had 1-2 loose stools a day a few times over the past week.  She also has some nausea.  Instructed to take imodium.  Zofran prescription sent to her pharmacy.

## 2020-07-04 NOTE — Progress Notes (Signed)
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   04/09/2016 Initial Biopsy   Gastric mass biopsy showed gastrointestinal stromal tumor, I feel mitosis and necrosis, intermediate to high-grade.   04/09/2016 Imaging   CT chest, abdomen and pelvis with contrast showed a large exophytic mass in the proximal lateral stomach measuring 10 x 7.4 cm, nodular lesions within omentum and multiple liver nodules are highly suspicious for metastatic disease.   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 Today's study demonstrates a positive response to therapy with regression of the primary gastric neoplasm, as well as  regression of numerous intraperitoneal metastatic lesions. Additionally, the liver lesions seen on the prior study appear far more well-defined, decreased in size and are much lower attenuation, suggesting internal areas of necrosis. 2. Several small pulmonary nodules are noted, some of which are new compared to the prior examination. These are nonspecific, but metastatic disease to the lungs is not excluded. Attention on follow-up imaging is recommended. 3. Importantly, there is also widespread ground-glass attenuation and septal thickening in the lungs bilaterally which is new compared to the prior study. In the appropriate clinical setting, this could reflect a drug reaction. Other differential considerations include atypical infection, or even developing interstitial lung disease. Clinical correlation is suggested. 4. Aortic atherosclerosis, in addition to right coronary artery disease. 5. Mild diffuse bronchial wall thickening with mild centrilobular and paraseptal emphysema; imaging findings suggestive of underlying COPD. 6. Additional incidental findings, as above.   09/22/2016 Imaging    CT CAP W CONTRAST   IMPRESSION: 1. Nodular appearing GI stromal tumor along the posterior gastric wall with associated peritoneal implants and hepatic metastases, stable from 07/16/2016. 2. Improving mid and lower lung zone predominant peribronchovascular ground-glass, possibly related to drug therapy. 3. A few scattered pulmonary nodules are stable. 4. Aortic atherosclerosis (ICD10-170.0). Coronary artery calcification.   01/14/2017 Imaging   CT CAP w Contrast  IMPRESSION: 1. Further decrease in size of large GI stromal tumor arising from the greater curvature of the stomach with improvement in hepatic and multifocal peritoneal metastases. 2. Further improvement in lower lung predominant peribronchovascular ground-glass density, probably inflammatory (drug reaction or  or infectious). 3. No acute findings. 4.  Aortic Atherosclerosis (ICD10-I70.0).  05/08/2017 Imaging   IMPRESSION: 1. Overall, today's examination is very similar to recent prior studies again demonstrating a large proximal gastric mass with adjacent gastrosplenic ligament lymphadenopathy, multiple treated lesions in the liver, and other intraperitoneal metastases, as discussed above. No definite new sites of metastatic disease are otherwise noted. 2. Aortic atherosclerosis, in addition to right coronary artery disease. 3. Mild cardiomegaly. 4. There are calcifications of the aortic valve. Echocardiographic correlation for evaluation of potential valvular dysfunction may be warranted if clinically indicated. 5. Additional incidental findings, as above. Aortic Atherosclerosis (ICD10-I70.0).   09/07/2017 Imaging   CT CAP W Contrast 09/07/17 IMPRESSION: 1. Large mass extending off the greater curvature of the stomach is similar to prior. Majority of nodularity within the adjacent mesentery is similar. There is one adjacent nodule which has increased in size when compared to prior exam. 2. Grossly unchanged treated hepatic lesions.   01/04/2018 Imaging   CT CAP WO COntrast 01/04/18 IMPRESSION: 1. No substantial interval change in exam. 2. Irregular mass at the proximal stomach is similar to prior. Scattered soft tissue lesions in the perigastric reason, omentum, and mesentery are similar to prior. No generalized trend towards worsening or improving disease. No new or progressive findings on today's study. 3. Stable appearance multiple hepatic lesions. 4.  Aortic Atherosclerois (ICD10-170.0)   05/05/2018 Imaging   CT CAP 05/05/18  IMPRESSION: 1. Mild response to therapy, as evidenced by decreased size of many soft tissue lesions along the greater curvature of the stomach, as well as within the omentum. 2. Similar hepatic metastasis. 3.  No acute process or evidence of  metastatic disease in the chest. 4.  Aortic Atherosclerosis (ICD10-I70.0). 5. Pulmonary artery enlargement suggests pulmonary arterial hypertension.   09/28/2018 Imaging   CT CAP 09/28/18  IMPRESSION: 1. No change in soft tissue nodularity about the greater curvature of the stomach and pancreatic tail (series 2, image 53).  2. No change in noncontrast appearance of numerous small hypodense metastatic lesions of the liver.  3. New 4 mm irregular pulmonary nodule of the right pulmonary apex (series 6, image 32), nonspecific. Attention on follow-up. Additional stable small pulmonary nodules and ground-glass opacities, for example 3 mm adjacent nodules of the right upper lobe (series 6, image 51).   03/29/2019 Imaging   CT CAP WO Contrast  IMPRESSION: 1. Calcified nodular soft tissue seen along the posterior stomach and tail of pancreas is stable in the interval. No new or progressive interval findings. 2. Numerous low-density liver lesions compatible with metastatic disease. Overall appearance is stable. 3. 4 mm irregular right apical pulmonary nodule identified previously as new is unchanged in the interval. Continued attention on follow-up recommended. 4.  Aortic Atherosclerois (ICD10-170.0)   09/26/2019 Imaging   CT CAP w Contrast  IMPRESSION: Chest Impression:   1. No evidence of thoracic metastasis.   Abdomen / Pelvis Impression:   1. Stable serosal nodularity along the greater curvature of the stomach. 2. Stable small round low-density lesions in liver. No significant change from CT 07/16/2016. 3. No evidence disease progression the abdomen or pelvis.   06/12/2020 Imaging   CT AP  IMPRESSION: New large lobulated soft tissue mass in the left abdomen, consistent with recurrent malignancy or metastatic disease.   New moderate ascites. Diffuse mesenteric and body wall edema also noted. Stable nodular soft tissue densities in the gastrosplenic ligament along the  greater curvature of the proximal gastric body, consistent with metastatic disease.   No significant change in multiple small low-attenuation liver  lesions.   Small hiatal hernia.   Aortic Atherosclerosis (ICD10-I70.0).      CURRENT THERAPY:  Gleevec $Remove'400mg'NMaqSrA$  daily starting 04/22/16, increase to $RemoveBef'400mg'dIdAHXiQjn$  bid on 06/19/2020 due to disease progression. Held since 07/06/20 due to AKI and severe anemia.   INTERVAL HISTORY:  Haley Mcmillan is here for a follow up. She presents to the clinic with her family member. She notes she is not feeling well. Her Hg is 5 today. She denies any GI or nose bleeding. She notes her BM are loose and hard to control. She will have 2 BM a day, not black. She has had loose BM for 7 days. She has nausea and vomiting and she is on antiemetics. She can eat some foods but not eating an adequate amount. She denies pain with eating. She notes her urinary output is low.     REVIEW OF SYSTEMS:   Constitutional: Denies fevers, chills or abnormal weight loss Eyes: Denies blurriness of vision Ears, nose, mouth, throat, and face: Denies mucositis or sore throat Respiratory: Denies cough, dyspnea or wheezes UA: (+) Low urinary output  Cardiovascular: Denies palpitation, chest discomfort or lower extremity swelling Gastrointestinal:  Denies nausea, heartburn or change in bowel habits Skin: Denies abnormal skin rashes Lymphatics: Denies new lymphadenopathy or easy bruising Neurological:Denies numbness, tingling or new weaknesses Behavioral/Psych: Mood is stable, no new changes  All other systems were reviewed with the patient and are negative.  MEDICAL HISTORY:  Past Medical History:  Diagnosis Date  . Anemia   . Arthritis   . Colon polyps   . GIST DX'D  03/2016  . Olecranon bursitis    Elbow  . Other and unspecified hyperlipidemia   . Palpitations   . Unspecified essential hypertension     SURGICAL HISTORY: Past Surgical History:  Procedure Laterality Date  .  COLONOSCOPY W/ POLYPECTOMY  01/2004  . CRYOTHERAPY     female-cryosurgery stated by pt  . ESOPHAGOGASTRODUODENOSCOPY N/A 04/09/2016   Procedure: ESOPHAGOGASTRODUODENOSCOPY (EGD);  Surgeon: Doran Stabler, MD;  Location: [facility] ENDOSCOPY;  Service: Endoscopy;  Laterality: N/A;  . POLYPECTOMY     from vocal surgery    I have reviewed the social history and family history with the patient and they are unchanged from previous note.  ALLERGIES:  has No Known Allergies.  MEDICATIONS:  No current facility-administered medications for this visit.   No current outpatient medications on file.   Facility-Administered Medications Ordered in Other Visits  Medication Dose Route Frequency Provider Last Rate Last Admin  . 0.9 %  sodium chloride infusion (Manually program via Guardrails IV Fluids)   Intravenous Once Gonfa, Taye T, MD      . 0.9 %  sodium chloride infusion   Intravenous Continuous Gonfa, Taye T, MD      . acetaminophen (TYLENOL) tablet 650 mg  650 mg Oral Q6H PRN Mercy Riding, MD       Or  . acetaminophen (TYLENOL) suppository 650 mg  650 mg Rectal Q6H PRN Gonfa, Taye T, MD      . fentaNYL (SUBLIMAZE) injection 12.5-50 mcg  12.5-50 mcg Intravenous Q2H PRN Gonfa, Taye T, MD      . heparin injection 5,000 Units  5,000 Units Subcutaneous Q8H Gonfa, Taye T, MD      . ondansetron (ZOFRAN) tablet 4 mg  4 mg Oral Q6H PRN Gonfa, Taye T, MD       Or  . ondansetron (ZOFRAN) injection 4 mg  4 mg Intravenous Q6H  PRN Mercy Riding, MD        PHYSICAL EXAMINATION: ECOG PERFORMANCE STATUS: 3 - Symptomatic, >50% confined to bed  Vitals:   07/06/20 1329  BP: (!) 105/42  Pulse: 85  Resp: 14  Temp: 97.8 F (36.6 C)  SpO2: 96%   Filed Weights   07/06/20 1329  Weight: 159 lb 11.2 oz (72.4 kg)    GENERAL:alert, no distress and comfortable SKIN: skin color, texture, turgor are normal, no rashes or significant lesions EYES: normal, Conjunctiva are pink and non-injected, sclera clear  NECK:  supple, thyroid normal size, non-tender, without nodularity LYMPH:  no palpable lymphadenopathy in the cervical, axillary  LUNGS: clear to auscultation and percussion with normal breathing effort HEART: regular rate & rhythm and no murmurs and no lower extremity edema ABDOMEN:abdomen soft, non-tender and normal bowel sounds (+)Abdominal bloating with increased Ascites  Musculoskeletal:no cyanosis of digits and no clubbing  NEURO: alert & oriented x 3 with fluent speech, no focal motor/sensory deficits  LABORATORY DATA:  I have reviewed the data as listed CBC Latest Ref Rng & Units 07/06/2020 06/12/2020 03/30/2020  WBC 4.0 - 10.5 K/uL 6.4 5.6 4.8  Hemoglobin 12.0 - 15.0 g/dL 5.0(LL) 9.2(L) 11.3(L)  Hematocrit 36.0 - 46.0 % 15.8(L) 27.9(L) 33.7(L)  Platelets 150 - 400 K/uL 184 206 186     CMP Latest Ref Rng & Units 07/06/2020 06/12/2020 03/30/2020  Glucose 70 - 99 mg/dL 134(H) 94 91  BUN 8 - 23 mg/dL 80(H) 13 9  Creatinine 0.44 - 1.00 mg/dL 13.23(HH) 1.75(H) 1.22(H)  Sodium 135 - 145 mmol/L 136 145 142  Potassium 3.5 - 5.1 mmol/L 4.8 3.5 3.6  Chloride 98 - 111 mmol/L 103 112(H) 111  CO2 22 - 32 mmol/L 16(L) 25 25  Calcium 8.9 - 10.3 mg/dL 8.3(L) 9.1 9.1  Total Protein 6.5 - 8.1 g/dL 6.7 7.0 7.1  Total Bilirubin 0.3 - 1.2 mg/dL 0.4 0.5 0.3  Alkaline Phos 38 - 126 U/L 48 50 65  AST 15 - 41 U/L $Remo'20 20 18  'wqdyh$ ALT 0 - 44 U/L '6 7 8      '$ RADIOGRAPHIC STUDIES: I have personally reviewed the radiological images as listed and agreed with the findings in the report. No results found.   ASSESSMENT & PLAN:  SULEYMA WAFER is a 87 y.o. female with   1. Malignant gastrointestinal stromal tumor (GIST) of stomach, with liver and peritoneal metastasis -Diagnosed in 03/2016 andhas beentreated with Gleevec $RemoveBefo'400mg'tgVWfJhbDkd$  daily since 04/22/16.Toleratingwell.  -Her prior restaging scans have showed great response to [place]. -Given CT AP from 06/12/20 showed newly developed moderate ascites, and a  large 21.4 cm mass in the left upper abdomen. She has had significant disease progression.  I increased her Gleevec to $RemoveBe'400mg'BmSGvlfFb$  BID on 06/19/20. If not enough will change to second-line [place].   -In the recent 1-2 weeks she has gotten more symptomatic. She has more abdominal bloating, low appetite, N&V, ascites, fatigue.   -Labs show Hg 5, BG 134, BUN 80, Cr 13.23, Ca 8.3, albumin 3.2. Will hold on Gleevec, obtain CT scan and admit to [facility] hospital for work up and management.   2. Anemia, iron deficiency  -likely related to [place]. -Has gotten increasingly worse in the past month. Hg 9.2 on 06/12/20 and Hg 5 today (07/06/20).  -She denies any overt bleeding or black stool. This is Likely related to current AKI.  -Will do anemia work up today and admit to [facility] for blood transfusion and better  management.    3. AKI on CKD, ? Obstructive  -baselin eCr 1.2-1.5 Her Cr worsened to 12.23 today (07/06/20). She notes decreased urinary output  -I discussed with disease progression her tumor could be compressing her kidney and effecting her kidney function.  -She also has increased ascites on exam today, she may need Paracentesis.  -will admit her to [facility] ED for better management.   4. HTN - On amlodipine 5 mg and lisinopril $RemoveBefore'20mg'HMVCnuKAaKxBQ$  daily, managed by PCP 5. Pulmonary nodules -Stable on 09/28/18 CT CAP and 09/26/19 CT CAP. Will continue monitoring.  6. Goal of care discussion  -We again discussed the incurable nature of her cancer, and the overall poor prognosis, especially if she does not have good response to treatment or progress on chemo -The patient understands the goal of care is palliative. -I recommend DNR/DNI, she will think about it    Plan -due to her AKI and severe anemia, will admit her to [facility] hospital under hospitalist team service, I recommend CT AP wo contrast to rule out urinary obstruction  -2u blood ordered, will likely give in hospital -Hold [place] team will f/u on weekend   No  problem-specific Assessment & Plan notes found for this encounter.   Orders Placed This Encounter  Procedures  . Retic Panel    Standing Status:   Future    Number of Occurrences:   1    Standing Expiration Date:   07/06/2021  . Lactate dehydrogenase (LDH)    Standing Status:   Future    Number of Occurrences:   1    Standing Expiration Date:   07/06/2021  . Ferritin    Standing Status:   Future    Number of Occurrences:   1    Standing Expiration Date:   07/06/2021  . Iron and TIBC    Standing Status:   Future    Number of Occurrences:   1    Standing Expiration Date:   07/06/2021  . Vitamin B12    Standing Status:   Future    Number of Occurrences:   1    Standing Expiration Date:   07/06/2021  . Care order/instruction    Transfuse Parameters    Standing Status:   Future    Standing Expiration Date:   07/06/2021  . Care order/instruction    Transfuse Parameters    Standing Status:   Future    Standing Expiration Date:   07/06/2021  . Type and screen    Standing Status:   Standing    Number of Occurrences:   1  . Type and screen    Standing Status:   Future    Standing Expiration Date:   07/06/2021  . Prepare RBC (crossmatch)    Standing Status:   Standing    Number of Occurrences:   1    Order Specific Question:   # of Units    Answer:   2 units    Order Specific Question:   Transfusion Indications    Answer:   Symptomatic Anemia    Order Specific Question:   Number of Units to Keep Ahead    Answer:   NO units ahead    Order Specific Question:   Date/Time blood product needed    Answer:   1 unit today 1/21 1 unit sunday 1/23    Order Specific Question:   If emergent release call blood bank    Answer:   [facility] [phone]  .  Type and screen         Standing Status:   Future    Standing Expiration Date:   07/06/2021   All questions were answered. The patient knows to call the clinic with any problems, questions or concerns. No barriers to learning was  detected. The total time spent in the appointment was 40 minutes.     Truitt Merle, MD 07/06/2020   I, Joslyn Devon, am acting as scribe for Truitt Merle, MD.   I have reviewed the above documentation for accuracy and completeness, and I agree with the above.

## 2020-07-06 ENCOUNTER — Inpatient Hospital Stay: Payer: Medicare PPO

## 2020-07-06 ENCOUNTER — Other Ambulatory Visit: Payer: Self-pay

## 2020-07-06 ENCOUNTER — Encounter (HOSPITAL_COMMUNITY): Payer: Self-pay | Admitting: Internal Medicine

## 2020-07-06 ENCOUNTER — Inpatient Hospital Stay (HOSPITAL_COMMUNITY): Payer: Medicare PPO

## 2020-07-06 ENCOUNTER — Inpatient Hospital Stay (HOSPITAL_BASED_OUTPATIENT_CLINIC_OR_DEPARTMENT_OTHER): Payer: Medicare PPO | Admitting: Hematology

## 2020-07-06 ENCOUNTER — Telehealth: Payer: Self-pay

## 2020-07-06 ENCOUNTER — Encounter: Payer: Self-pay | Admitting: Hematology

## 2020-07-06 ENCOUNTER — Inpatient Hospital Stay (HOSPITAL_COMMUNITY)
Admission: AD | Admit: 2020-07-06 | Discharge: 2020-07-11 | DRG: 682 | Disposition: A | Discharge status: Hospice | Payer: Medicare PPO | Source: Ambulatory Visit | Attending: Internal Medicine | Admitting: Internal Medicine

## 2020-07-06 VITALS — BP 105/42 | HR 85 | Temp 97.8°F | Resp 14 | Ht 67.0 in | Wt 159.7 lb

## 2020-07-06 VITALS — BP 102/40 | HR 82 | Temp 97.8°F | Resp 16

## 2020-07-06 DIAGNOSIS — I10 Essential (primary) hypertension: Secondary | ICD-10-CM

## 2020-07-06 DIAGNOSIS — C786 Secondary malignant neoplasm of retroperitoneum and peritoneum: Secondary | ICD-10-CM | POA: Diagnosis not present

## 2020-07-06 DIAGNOSIS — R7989 Other specified abnormal findings of blood chemistry: Secondary | ICD-10-CM | POA: Diagnosis not present

## 2020-07-06 DIAGNOSIS — R188 Other ascites: Secondary | ICD-10-CM | POA: Diagnosis present

## 2020-07-06 DIAGNOSIS — D509 Iron deficiency anemia, unspecified: Secondary | ICD-10-CM | POA: Diagnosis present

## 2020-07-06 DIAGNOSIS — K591 Functional diarrhea: Secondary | ICD-10-CM | POA: Diagnosis not present

## 2020-07-06 DIAGNOSIS — Z20822 Contact with and (suspected) exposure to covid-19: Secondary | ICD-10-CM | POA: Diagnosis present

## 2020-07-06 DIAGNOSIS — E883 Tumor lysis syndrome: Secondary | ICD-10-CM | POA: Diagnosis not present

## 2020-07-06 DIAGNOSIS — D649 Anemia, unspecified: Secondary | ICD-10-CM

## 2020-07-06 DIAGNOSIS — Z66 Do not resuscitate: Secondary | ICD-10-CM | POA: Diagnosis not present

## 2020-07-06 DIAGNOSIS — D638 Anemia in other chronic diseases classified elsewhere: Secondary | ICD-10-CM | POA: Diagnosis present

## 2020-07-06 DIAGNOSIS — C49A2 Gastrointestinal stromal tumor of stomach: Secondary | ICD-10-CM

## 2020-07-06 DIAGNOSIS — Z85831 Personal history of malignant neoplasm of soft tissue: Secondary | ICD-10-CM

## 2020-07-06 DIAGNOSIS — Z6825 Body mass index (BMI) 25.0-25.9, adult: Secondary | ICD-10-CM | POA: Diagnosis not present

## 2020-07-06 DIAGNOSIS — Z515 Encounter for palliative care: Secondary | ICD-10-CM | POA: Diagnosis not present

## 2020-07-06 DIAGNOSIS — C787 Secondary malignant neoplasm of liver and intrahepatic bile duct: Secondary | ICD-10-CM | POA: Diagnosis present

## 2020-07-06 DIAGNOSIS — C49A4 Gastrointestinal stromal tumor of large intestine: Secondary | ICD-10-CM | POA: Diagnosis not present

## 2020-07-06 DIAGNOSIS — N179 Acute kidney failure, unspecified: Secondary | ICD-10-CM | POA: Diagnosis not present

## 2020-07-06 DIAGNOSIS — J90 Pleural effusion, not elsewhere classified: Secondary | ICD-10-CM | POA: Diagnosis not present

## 2020-07-06 DIAGNOSIS — R0902 Hypoxemia: Secondary | ICD-10-CM

## 2020-07-06 DIAGNOSIS — R627 Adult failure to thrive: Secondary | ICD-10-CM | POA: Diagnosis present

## 2020-07-06 DIAGNOSIS — D500 Iron deficiency anemia secondary to blood loss (chronic): Secondary | ICD-10-CM

## 2020-07-06 DIAGNOSIS — J9811 Atelectasis: Secondary | ICD-10-CM | POA: Diagnosis not present

## 2020-07-06 DIAGNOSIS — N1831 Chronic kidney disease, stage 3a: Secondary | ICD-10-CM | POA: Diagnosis present

## 2020-07-06 DIAGNOSIS — E785 Hyperlipidemia, unspecified: Secondary | ICD-10-CM | POA: Diagnosis present

## 2020-07-06 DIAGNOSIS — K7689 Other specified diseases of liver: Secondary | ICD-10-CM | POA: Diagnosis not present

## 2020-07-06 DIAGNOSIS — C49A0 Gastrointestinal stromal tumor, unspecified site: Secondary | ICD-10-CM | POA: Diagnosis not present

## 2020-07-06 DIAGNOSIS — Z801 Family history of malignant neoplasm of trachea, bronchus and lung: Secondary | ICD-10-CM

## 2020-07-06 DIAGNOSIS — R21 Rash and other nonspecific skin eruption: Secondary | ICD-10-CM | POA: Diagnosis not present

## 2020-07-06 DIAGNOSIS — Z87891 Personal history of nicotine dependence: Secondary | ICD-10-CM

## 2020-07-06 DIAGNOSIS — R197 Diarrhea, unspecified: Secondary | ICD-10-CM | POA: Diagnosis present

## 2020-07-06 DIAGNOSIS — H9190 Unspecified hearing loss, unspecified ear: Secondary | ICD-10-CM | POA: Diagnosis present

## 2020-07-06 DIAGNOSIS — E872 Acidosis: Secondary | ICD-10-CM | POA: Diagnosis present

## 2020-07-06 DIAGNOSIS — J9601 Acute respiratory failure with hypoxia: Secondary | ICD-10-CM | POA: Diagnosis not present

## 2020-07-06 DIAGNOSIS — R630 Anorexia: Secondary | ICD-10-CM | POA: Diagnosis not present

## 2020-07-06 DIAGNOSIS — I129 Hypertensive chronic kidney disease with stage 1 through stage 4 chronic kidney disease, or unspecified chronic kidney disease: Secondary | ICD-10-CM | POA: Diagnosis not present

## 2020-07-06 DIAGNOSIS — I959 Hypotension, unspecified: Secondary | ICD-10-CM | POA: Diagnosis not present

## 2020-07-06 DIAGNOSIS — Z803 Family history of malignant neoplasm of breast: Secondary | ICD-10-CM

## 2020-07-06 DIAGNOSIS — N170 Acute kidney failure with tubular necrosis: Secondary | ICD-10-CM | POA: Diagnosis not present

## 2020-07-06 DIAGNOSIS — I517 Cardiomegaly: Secondary | ICD-10-CM | POA: Diagnosis not present

## 2020-07-06 DIAGNOSIS — Z8249 Family history of ischemic heart disease and other diseases of the circulatory system: Secondary | ICD-10-CM

## 2020-07-06 DIAGNOSIS — I700 Atherosclerosis of aorta: Secondary | ICD-10-CM | POA: Diagnosis present

## 2020-07-06 DIAGNOSIS — Z7189 Other specified counseling: Secondary | ICD-10-CM | POA: Diagnosis not present

## 2020-07-06 DIAGNOSIS — Z79899 Other long term (current) drug therapy: Secondary | ICD-10-CM

## 2020-07-06 DIAGNOSIS — E876 Hypokalemia: Secondary | ICD-10-CM | POA: Diagnosis not present

## 2020-07-06 DIAGNOSIS — N1832 Chronic kidney disease, stage 3b: Secondary | ICD-10-CM | POA: Diagnosis not present

## 2020-07-06 DIAGNOSIS — K6389 Other specified diseases of intestine: Secondary | ICD-10-CM | POA: Diagnosis not present

## 2020-07-06 LAB — RETIC PANEL
Immature Retic Fract: 17.3 % — ABNORMAL HIGH (ref 2.3–15.9)
RBC.: 1.43 MIL/uL — ABNORMAL LOW (ref 3.87–5.11)
Retic Count, Absolute: 19.7 10*3/uL (ref 19.0–186.0)
Retic Ct Pct: 1.4 % (ref 0.4–3.1)
Reticulocyte Hemoglobin: 39.3 pg (ref >27.9)

## 2020-07-06 LAB — PREPARE RBC (CROSSMATCH)

## 2020-07-06 LAB — CBC WITH DIFFERENTIAL/PLATELET
Abs Immature Granulocytes: 0.03 10*3/uL (ref 0.00–0.07)
Basophils Absolute: 0 10*3/uL (ref 0.0–0.1)
Basophils Relative: 0 %
Eosinophils Absolute: 0 10*3/uL (ref 0.0–0.5)
Eosinophils Relative: 0 %
HCT: 15.8 % — ABNORMAL LOW (ref 36.0–46.0)
Hemoglobin: 5 g/dL — CL (ref 12.0–15.0)
Immature Granulocytes: 1 %
Lymphocytes Relative: 6 %
Lymphs Abs: 0.4 10*3/uL — ABNORMAL LOW (ref 0.7–4.0)
MCH: 35 pg — ABNORMAL HIGH (ref 26.0–34.0)
MCHC: 31.6 g/dL (ref 30.0–36.0)
MCV: 110.5 fL — ABNORMAL HIGH (ref 80.0–100.0)
Monocytes Absolute: 0.4 10*3/uL (ref 0.1–1.0)
Monocytes Relative: 6 %
Neutro Abs: 5.6 10*3/uL (ref 1.7–7.7)
Neutrophils Relative %: 87 %
Platelets: 184 10*3/uL (ref 150–400)
RBC: 1.43 MIL/uL — ABNORMAL LOW (ref 3.87–5.11)
RDW: 15.9 % — ABNORMAL HIGH (ref 11.5–15.5)
WBC: 6.4 10*3/uL (ref 4.0–10.5)
nRBC: 0 % (ref 0.0–0.2)

## 2020-07-06 LAB — VITAMIN B12: Vitamin B-12: 509 pg/mL (ref 180–914)

## 2020-07-06 LAB — COMPREHENSIVE METABOLIC PANEL
ALT: <6 U/L (ref 0–44)
AST: 20 U/L (ref 15–41)
Albumin: 3.2 g/dL — ABNORMAL LOW (ref 3.5–5.0)
Alkaline Phosphatase: 48 U/L (ref 38–126)
Anion gap: 17 — ABNORMAL HIGH (ref 5–15)
BUN: 80 mg/dL — ABNORMAL HIGH (ref 8–23)
CO2: 16 mmol/L — ABNORMAL LOW (ref 22–32)
Calcium: 8.3 mg/dL — ABNORMAL LOW (ref 8.9–10.3)
Chloride: 103 mmol/L (ref 98–111)
Creatinine, Ser: 13.23 mg/dL (ref 0.44–1.00)
GFR, Estimated: 3 mL/min — ABNORMAL LOW (ref >60)
Glucose, Bld: 134 mg/dL — ABNORMAL HIGH (ref 70–99)
Potassium: 4.8 mmol/L (ref 3.5–5.1)
Sodium: 136 mmol/L (ref 135–145)
Total Bilirubin: 0.4 mg/dL (ref 0.3–1.2)
Total Protein: 6.7 g/dL (ref 6.5–8.1)

## 2020-07-06 LAB — LACTATE DEHYDROGENASE: LDH: 464 U/L — ABNORMAL HIGH (ref 98–192)

## 2020-07-06 LAB — ABO/RH: ABO/RH(D): O POS

## 2020-07-06 MED ORDER — ONDANSETRON HCL 4 MG PO TABS: 4.0000 mg | ORAL_TABLET | Freq: Four times a day (QID) | ORAL | Status: DC | PRN

## 2020-07-06 MED ORDER — SODIUM CHLORIDE 0.9 % IV SOLN
Freq: Once | INTRAVENOUS | Status: AC
  Filled 2020-07-06: qty 250

## 2020-07-06 MED ORDER — HEPARIN SODIUM (PORCINE) 5000 UNIT/ML IJ SOLN: 5000.0000 [IU] | Freq: Three times a day (TID) | INTRAMUSCULAR | Status: DC

## 2020-07-06 MED ORDER — NEPRO/CARBSTEADY PO LIQD
237.0000 mL | Freq: Two times a day (BID) | ORAL | Status: DC
  Administered 2020-07-07 – 2020-07-11 (×8): 237 mL via ORAL
  Filled 2020-07-06 (×10): qty 237

## 2020-07-06 MED ORDER — ACETAMINOPHEN 325 MG PO TABS
650.0000 mg | ORAL_TABLET | Freq: Four times a day (QID) | ORAL | Status: DC | PRN
  Administered 2020-07-08 – 2020-07-10 (×2): 650 mg via ORAL
  Filled 2020-07-06 (×2): qty 2

## 2020-07-06 MED ORDER — ACETAMINOPHEN 650 MG RE SUPP: 650.0000 mg | Freq: Four times a day (QID) | RECTAL | Status: DC | PRN

## 2020-07-06 MED ORDER — LIP MEDEX EX OINT
TOPICAL_OINTMENT | CUTANEOUS | Status: AC
  Filled 2020-07-06: qty 7

## 2020-07-06 MED ORDER — ONDANSETRON HCL 4 MG/2ML IJ SOLN
4.0000 mg | Freq: Four times a day (QID) | INTRAMUSCULAR | Status: DC | PRN
  Administered 2020-07-07 – 2020-07-08 (×4): 4 mg via INTRAVENOUS
  Filled 2020-07-06 (×4): qty 2

## 2020-07-06 MED ORDER — SODIUM CHLORIDE 0.9 % IV SOLN: INTRAVENOUS | Status: DC

## 2020-07-06 MED ORDER — FENTANYL CITRATE (PF) 100 MCG/2ML IJ SOLN
12.5000 ug | INTRAMUSCULAR | Status: DC | PRN
  Administered 2020-07-08: 12.5 ug via INTRAVENOUS
  Administered 2020-07-08: 50 ug via INTRAVENOUS
  Filled 2020-07-06 (×3): qty 2

## 2020-07-06 MED ORDER — SODIUM CHLORIDE 0.9% IV SOLUTION: Freq: Once | INTRAVENOUS | Status: DC

## 2020-07-06 NOTE — Telephone Encounter (Signed)
CRITICAL VALUE STICKER  CRITICAL VALUE: Hgb = 5.0  RECEIVER (on-site recipient of call): Yetta Glassman, CMA  DATE & TIME NOTIFIED: 07/06/20 at 1:22pm  MESSENGER (representative from lab): Hillary  MD NOTIFIED: Dr. Burr Medico  TIME OF NOTIFICATION: 07/06/20 at 1:24p  RESPONSE: Notification given to Tollie Pizza., RN for follow-up with provider.

## 2020-07-06 NOTE — Patient Instructions (Signed)
Dehydration, Adult Dehydration is condition in which there is not enough water or other fluids in the body. This happens when a person loses more fluids than he or she takes in. Important body parts cannot work right without the right amount of fluids. Any loss of fluids from the body can cause dehydration. Dehydration can be mild, worse, or very bad. It should be treated right away to keep it from getting very bad. What are the causes? This condition may be caused by:  Conditions that cause loss of water or other fluids, such as: ? Watery poop (diarrhea). ? Vomiting. ? Sweating a lot. ? Peeing (urinating) a lot.  Not drinking enough fluids, especially when you: ? Are ill. ? Are doing things that take a lot of energy to do.  Other illnesses and conditions, such as fever or infection.  Certain medicines, such as medicines that take extra fluid out of the body (diuretics).  Lack of safe drinking water.  Not being able to get enough water and food. What increases the risk? The following factors may make you more likely to develop this condition:  Having a long-term (chronic) illness that has not been treated the right way, such as: ? Diabetes. ? Heart disease. ? Kidney disease.  Being 65 years of age or older.  Having a disability.  Living in a place that is high above the ground or sea (high in altitude). The thinner, dried air causes more fluid loss.  Doing exercises that put stress on your body for a long time. What are the signs or symptoms? Symptoms of dehydration depend on how bad it is. Mild or worse dehydration  Thirst.  Dry lips or dry mouth.  Feeling dizzy or light-headed, especially when you stand up from sitting.  Muscle cramps.  Your body making: ? Dark pee (urine). Pee may be the color of tea. ? Less pee than normal. ? Less tears than normal.  Headache. Very bad dehydration  Changes in skin. Skin may: ? Be cold to the touch (clammy). ? Be blotchy  or pale. ? Not go back to normal right after you lightly pinch it and let it go.  Little or no tears, pee, or sweat.  Changes in vital signs, such as: ? Fast breathing. ? Low blood pressure. ? Weak pulse. ? Pulse that is more than 100 beats a minute when you are sitting still.  Other changes, such as: ? Feeling very thirsty. ? Eyes that look hollow (sunken). ? Cold hands and feet. ? Being mixed up (confused). ? Being very tired (lethargic) or having trouble waking from sleep. ? Short-term weight loss. ? Loss of consciousness. How is this treated? Treatment for this condition depends on how bad it is. Treatment should start right away. Do not wait until your condition gets very bad. Very bad dehydration is an emergency. You will need to go to a hospital.  Mild or worse dehydration can be treated at home. You may be asked to: ? Drink more fluids. ? Drink an oral rehydration solution (ORS). This drink helps get the right amounts of fluids and salts and minerals in the blood (electrolytes).  Very bad dehydration can be treated: ? With fluids through an IV tube. ? By getting normal levels of salts and minerals in your blood. This is often done by giving salts and minerals through a tube. The tube is passed through your nose and into your stomach. ? By treating the root cause. Follow these instructions at   home: Oral rehydration solution If told by your doctor, drink an ORS:  Make an ORS. Use instructions on the package.  Start by drinking small amounts, about  cup (120 mL) every 5-10 minutes.  Slowly drink more until you have had the amount that your doctor said to have. Eating and drinking  Drink enough clear fluid to keep your pee pale yellow. If you were told to drink an ORS, finish the ORS first. Then, start slowly drinking other clear fluids. Drink fluids such as: ? Water. Do not drink only water. Doing that can make the salt (sodium) level in your body get too low. ? Water  from ice chips you suck on. ? Fruit juice that you have added water to (diluted). ? Low-calorie sports drinks.  Eat foods that have the right amounts of salts and minerals, such as: ? Bananas. ? Oranges. ? Potatoes. ? Tomatoes. ? Spinach.  Do not drink alcohol.  Avoid: ? Drinks that have a lot of sugar. These include:  High-calorie sports drinks.  Fruit juice that you did not add water to.  Soda.  Caffeine. ? Foods that are greasy or have a lot of fat or sugar.         General instructions  Take over-the-counter and prescription medicines only as told by your doctor.  Do not take salt tablets. Doing that can make the salt level in your body get too high.  Return to your normal activities as told by your doctor. Ask your doctor what activities are safe for you.  Keep all follow-up visits as told by your doctor. This is important. Contact a doctor if:  You have pain in your belly (abdomen) and the pain: ? Gets worse. ? Stays in one place.  You have a rash.  You have a stiff neck.  You get angry or annoyed (irritable) more easily than normal.  You are more tired or have a harder time waking than normal.  You feel: ? Weak or dizzy. ? Very thirsty. Get help right away if you have:  Any symptoms of very bad dehydration.  Symptoms of vomiting, such as: ? You cannot eat or drink without vomiting. ? Your vomiting gets worse or does not go away. ? Your vomit has blood or green stuff in it.  Symptoms that get worse with treatment.  A fever.  A very bad headache.  Problems with peeing or pooping (having a bowel movement), such as: ? Watery poop that gets worse or does not go away. ? Blood in your poop (stool). This may cause poop to look black and tarry. ? Not peeing in 6-8 hours. ? Peeing only a small amount of very dark pee in 6-8 hours.  Trouble breathing. These symptoms may be an emergency. Do not wait to see if the symptoms will go away. Get  medical help right away. Call your local emergency services (911 in the [place] Do not drive yourself to the hospital. Summary  Dehydration is a condition in which there is not enough water or other fluids in the body. This happens when a person loses more fluids than he or she takes in.  Treatment for this condition depends on how bad it is. Treatment should be started right away. Do not wait until your condition gets very bad.  Drink enough clear fluid to keep your pee pale yellow. If you were told to drink an oral rehydration solution (ORS), finish the ORS first. Then, start slowly drinking other clear fluids.    Take over-the-counter and prescription medicines only as told by your doctor.  Get help right away if you have any symptoms of very bad dehydration. This information is not intended to replace advice given to you by your health care provider. Make sure you discuss any questions you have with your health care provider. Document Revised: 01/13/2019 Document Reviewed: 01/13/2019 Elsevier Patient Education  2021 [employer]  

## 2020-07-06 NOTE — Progress Notes (Signed)
Critical Value Hgb 5.0 Dr Burr Medico notified  Critical Value Creatinine: 13.23 Dr Burr Medico notified

## 2020-07-06 NOTE — H&P (Signed)
History and Physical    Haley Mcmillan OVF:[id number] DOB: Oct 09, 1933 DOA: 07/06/2020  PCP: Hoyt Koch, MD Patient coming from: Home.  Lives with her husband.  Ambulates independently.  Chief Complaint: AKI  HPI: Haley Mcmillan is a 87 y.o. 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 Burr Medico, CKD-3A, IDA, HTN, HLD and diminished hearing admitted from oncology office due to acute renal failure and symptomatic anemia.   Patient presented to her oncology office for routine follow-up after increment on her [place] 2 weeks prior.  She had blood work that showed hemoglobin of 5.0 (9.2 on 06/12/2020), and Cr 13.23 with BUN of 80.  She was directed to [facility] for further evaluation and treatment.  Per patient and patient's daughter at the bedside, patient has been eating okay but not drinking well for the last 10 days. She had a couple of emesis, 1 about a week ago and a little bit this morning.  Emesis was nonbloody.  She also had some loose stool and incontinence with standing about twice a day for about 1 week.  She was advised to try Imodium.  Denies melena or hematochezia.  Felt her stomach has gotten bigger but she denies abdominal pain.  Denies new medication other than recent increment of a Gleevec.  She is on lisinopril for hypertension.  Denies over-the-counter pain medication or NSAID use.  Denies fever, shortness of breath, chest pain, cough or URI symptoms.  Noted decreased urine output over the last 2 weeks.  Also feels cold which she attributes to her anemia.  She is fully vaccinated against COVID-19 including booster shot.  Lives with her husband.  Independently ambulates at baseline.  Independent for ADLs.  Denies smoking cigarettes, drinking alcohol or recreational drug use.  Prefers to be DNR/DNI after discussion about CODE STATUS.  Agrees to blood transfusion.  At oncology office, CMP significant for Cr 13.23 (1.75 on  06/12/2020), BUN 80, CO2 16, Ag 17.  Hgb 5.0 (9.2 on 06/12/2020).  MCV 110.5.  LDH 464.  ROS All review of system negative except for pertinent positives and negatives as history of present illness above.  PMH Past Medical History:  Diagnosis Date  . Anemia   . Arthritis   . Colon polyps   . GIST DX'D  03/2016  . Olecranon bursitis    Elbow  . Other and unspecified hyperlipidemia   . Palpitations   . Unspecified essential hypertension    PSH Past Surgical History:  Procedure Laterality Date  . COLONOSCOPY W/ POLYPECTOMY  01/2004  . CRYOTHERAPY     female-cryosurgery stated by pt  . ESOPHAGOGASTRODUODENOSCOPY N/A 04/09/2016   Procedure: ESOPHAGOGASTRODUODENOSCOPY (EGD);  Surgeon: Doran Stabler, MD;  Location: [facility] ENDOSCOPY;  Service: Endoscopy;  Laterality: N/A;  . POLYPECTOMY     from vocal surgery   Fam HX Family History  Problem Relation Age of Onset  . Gallstones Mother   . Aneurysm Father        brain  . Heart disease Father        CAD  . Breast cancer Sister   . Breast cancer Sister   . Heart attack Brother   . Cancer Brother   . Lung cancer Son   . Diabetes Neg Hx     Social Hx  reports that she quit smoking about 25 years ago. Her smoking use included cigarettes. She has a 25.00 pack-year smoking history. She has never used smokeless  tobacco. She reports previous alcohol use. She reports that she does not use drugs.  Allergy No Known Allergies Home Meds Prior to Admission medications   Medication Sig Start Date End Date Taking? Authorizing Provider  amLODipine (NORVASC) 5 MG tablet Take 1 Tablet by mouth daily 04/30/20   Hoyt Koch, MD  Blood Pressure Monitoring (BLOOD PRESSURE CUFF) MISC Measure BP daily  Dx: hypertension 07/09/18   Binnie Rail, MD  Cholecalciferol (VITAMIN D3) 5000 units CAPS Take 5,000 Units by mouth daily.     [provider]  Cyanocobalamin (VITAMIN B 12 PO) Take 1 tablet by mouth daily.    [provider]  imatinib (GLEEVEC) 400 MG tablet Take 1 tablet (400 mg total) by mouth 2 (two) times daily. Take with meals and large glass of water.Caution:Chemotherapy. 06/19/20   Truitt Merle, MD  lisinopril (ZESTRIL) 20 MG tablet TAKE 1 TABLET(20 MG) BY MOUTH DAILY 04/30/20   Hoyt Koch, MD  ondansetron (ZOFRAN) 8 MG tablet Take 1 tablet (8 mg total) by mouth every 8 (eight) hours as needed for nausea or vomiting. 07/04/20   Truitt Merle, MD  pantoprazole (PROTONIX) 40 MG tablet Take 1 tablet (40 mg total) by mouth daily. 05/09/20   Hoyt Koch, MD    Physical Exam: Vitals:   07/06/20 1600  Weight: 72.4 kg  Height: 5\' 7"  (1.702 m)    GENERAL: No acute distress.  Appears well.  HEENT: MMM.  Vision grossly intact.  Diminished hearing. NECK: Supple.  No apparent JVD.  RESP: On RA.  No IWOB. Good air movement bilaterally. CVS:  RRR. Heart sounds normal.  ABD/GI/GU: Bowel sounds present.  Distended abdomen.  Dull to percussion.  No significant tenderness. MSK/EXT:  Moves extremities. No apparent deformity or edema.  SKIN: no apparent skin lesion or wound NEURO: Awake, alert and oriented x4.  No gross deficit.  PSYCH: Calm. Normal affect.    Personally Reviewed Radiological Exams No results found.   Personally Reviewed Labs: CBC: Recent Labs  Lab 07/06/20 1308  WBC 6.4  NEUTROABS 5.6  HGB 5.0*  HCT 15.8*  MCV 110.5*  PLT 034   Basic Metabolic Panel: Recent Labs  Lab 07/06/20 1308  NA 136  K 4.8  CL 103  CO2 16*  GLUCOSE 134*  BUN 80*  CREATININE 13.23*  CALCIUM 8.3*   GFR: Estimated Creatinine Clearance: 3.1 mL/min (A) (by C-G formula based on SCr of 13.23 mg/dL [facility])). Liver Function Tests: Recent Labs  Lab 07/06/20 1308  AST 20  ALT <6  ALKPHOS 48  BILITOT 0.4  PROT 6.7  ALBUMIN 3.2*   No results for input(s): LIPASE, AMYLASE in the last 168 hours. No results for input(s): AMMONIA in the last 168 hours. Coagulation Profile: No results  for input(s): INR, PROTIME in the last 168 hours. Cardiac Enzymes: No results for input(s): CKTOTAL, CKMB, CKMBINDEX, TROPONINI in the last 168 hours. BNP (last 3 results) No results for input(s): PROBNP in the last 8760 hours. HbA1C: No results for input(s): HGBA1C in the last 72 hours. CBG: No results for input(s): GLUCAP in the last 168 hours. Lipid Profile: No results for input(s): CHOL, HDL, LDLCALC, TRIG, CHOLHDL, LDLDIRECT in the last 72 hours. Thyroid Function Tests: No results for input(s): TSH, T4TOTAL, FREET4, T3FREE, THYROIDAB in the last 72 hours. Anemia Panel: Recent Labs    07/06/20 1356 07/06/20 1357  VITAMINB12  --  509  RETICCTPCT 1.4  --    Urine analysis:  Component Value Date/Time   COLORURINE YELLOW 04/08/2016 1752   APPEARANCEUR CLOUDY (A) 04/08/2016 1752   LABSPEC 1.010 04/08/2016 1752   PHURINE 7.5 04/08/2016 1752   GLUCOSEU NEGATIVE 04/08/2016 1752   HGBUR SMALL (A) 04/08/2016 1752   BILIRUBINUR NEGATIVE 04/08/2016 1752   KETONESUR NEGATIVE 04/08/2016 1752   PROTEINUR NEGATIVE 04/08/2016 1752   NITRITE NEGATIVE 04/08/2016 1752   LEUKOCYTESUR LARGE (A) 04/08/2016 1752    Sepsis Labs:  None  Personally Reviewed EKG:  Not obtained yet  Assessment/Plan AKI on CKD-3A/azotemia- Cr 13.23 from 1.75 on 12/28.  BUN 80.  Suspect prerenal from poor oral hydration and possible mass-effect on renal vasculature.recent CT on 12/28 showed developing ascites and large 21.4 cm mass in the left upper abdomen.  She is also on lisinopril which could contribute.  She denies NSAID use.  -CT abdomen and pelvis without contrast to exclude obstructive uropathy -Check FeNA, urinalysis, UPC -IV NS at 100 cc an hour -Strict intake and output. -Hold home lisinopril and avoid nephrotoxic meds -Consult nephrology in the morning  Normocytic anemia in patient with history of iron deficiency anemia- Hgb 5.0 from 9.2 about 3 weeks prior.  MCV 110.  Denies melena or  hematochezia.  Her Gleevec was increased recently which could contribute. -Check anemia panel, LDH and haptoglobin -Type and screen and transfuse 2 units -Check Hemoccult.  Anion gap metabolic acidosis: Likely due to azotemia. -Continue monitoring  Stage IV gastrointestinal stromal tumor-followed by Dr. Mosetta Putt.  Recent CT showed new large lobulated soft tissue mass in left abdomen concerning for recurrence of malignancy or metastatic.  Had her Gleevec dose increased on 06/20/2019 -Heart oncology to treatment team. -Hold Gleevec for now  Diarrhea/stool incontinence-could be related to Gleevec.  Cannot rule out overflow incontinence.  Clinically low suspicion for C. difficile. -CT abdomen and pelvis -IV fluid as above  Essential hypertension: BP 105/42. -Hold home antihypertensive meds -IV fluid as above   DVT prophylaxis: Subcu heparin  Code Status: DNR/DNI Family Communication: Updated patient's daughter at the bedside.  Disposition Plan: Admit to MedSurg Consults called: oncology Admission status: Inpatient    Almon Hercules MD [facility]  If 7PM-7AM, please contact night-coverage [email or web address]  07/06/2020, 4:49 PM

## 2020-07-07 DIAGNOSIS — D638 Anemia in other chronic diseases classified elsewhere: Secondary | ICD-10-CM

## 2020-07-07 DIAGNOSIS — C49A2 Gastrointestinal stromal tumor of stomach: Secondary | ICD-10-CM

## 2020-07-07 DIAGNOSIS — I959 Hypotension, unspecified: Secondary | ICD-10-CM

## 2020-07-07 DIAGNOSIS — E872 Acidosis: Secondary | ICD-10-CM

## 2020-07-07 DIAGNOSIS — Z66 Do not resuscitate: Secondary | ICD-10-CM

## 2020-07-07 DIAGNOSIS — Z7189 Other specified counseling: Secondary | ICD-10-CM

## 2020-07-07 LAB — CBC
HCT: 26.6 % — ABNORMAL LOW (ref 36.0–46.0)
Hemoglobin: 8.5 g/dL — ABNORMAL LOW (ref 12.0–15.0)
MCH: 31.6 pg (ref 26.0–34.0)
MCHC: 32 g/dL (ref 30.0–36.0)
MCV: 98.9 fL (ref 80.0–100.0)
Platelets: 178 10*3/uL (ref 150–400)
RBC: 2.69 MIL/uL — ABNORMAL LOW (ref 3.87–5.11)
RDW: 23 % — ABNORMAL HIGH (ref 11.5–15.5)
WBC: 7.2 10*3/uL (ref 4.0–10.5)
nRBC: 0 % (ref 0.0–0.2)

## 2020-07-07 LAB — URINALYSIS, ROUTINE W REFLEX MICROSCOPIC
Bilirubin Urine: NEGATIVE
Glucose, UA: NEGATIVE mg/dL
Ketones, ur: NEGATIVE mg/dL
Nitrite: NEGATIVE
Protein, ur: 30 mg/dL — AB
Specific Gravity, Urine: 1.011 (ref 1.005–1.030)
WBC, UA: >50 WBC/hpf — ABNORMAL HIGH (ref 0–5)
pH: 5 (ref 5.0–8.0)

## 2020-07-07 LAB — PROTEIN / CREATININE RATIO, URINE
Creatinine, Urine: 198.02 mg/dL
Protein Creatinine Ratio: 0.67 mg/mg{Cre} — ABNORMAL HIGH (ref 0.00–0.15)
Total Protein, Urine: 132 mg/dL

## 2020-07-07 LAB — BASIC METABOLIC PANEL
Anion gap: 14 (ref 5–15)
BUN: 83 mg/dL — ABNORMAL HIGH (ref 8–23)
CO2: 17 mmol/L — ABNORMAL LOW (ref 22–32)
Calcium: 8.3 mg/dL — ABNORMAL LOW (ref 8.9–10.3)
Chloride: 106 mmol/L (ref 98–111)
Creatinine, Ser: 12.88 mg/dL — ABNORMAL HIGH (ref 0.44–1.00)
GFR, Estimated: 3 mL/min — ABNORMAL LOW (ref >60)
Glucose, Bld: 97 mg/dL (ref 70–99)
Potassium: 4.7 mmol/L (ref 3.5–5.1)
Sodium: 137 mmol/L (ref 135–145)

## 2020-07-07 LAB — TYPE AND SCREEN
ABO/RH(D): O POS
Antibody Screen: NEGATIVE
Unit division: 0
Unit division: 0

## 2020-07-07 LAB — SODIUM, URINE, RANDOM: Sodium, Ur: 14 mmol/L

## 2020-07-07 LAB — BPAM RBC
Blood Product Expiration Date: 202202162359
Blood Product Expiration Date: 202202192359
ISSUE DATE / TIME: 202201211713
ISSUE DATE / TIME: 202201212051
Unit Type and Rh: 5100
Unit Type and Rh: 5100

## 2020-07-07 LAB — SARS CORONAVIRUS 2 (TAT 6-24 HRS): SARS Coronavirus 2: NEGATIVE

## 2020-07-07 LAB — FOLATE: Folate: 6.3 ng/mL (ref >5.9)

## 2020-07-07 MED ORDER — SODIUM CHLORIDE 0.9 % IV BOLUS
500.0000 mL | Freq: Once | INTRAVENOUS | Status: AC
  Administered 2020-07-07: 500 mL via INTRAVENOUS

## 2020-07-07 MED ORDER — HEPARIN SODIUM (PORCINE) 5000 UNIT/ML IJ SOLN
5000.0000 [IU] | Freq: Three times a day (TID) | INTRAMUSCULAR | Status: DC
  Administered 2020-07-07: 5000 [IU] via SUBCUTANEOUS
  Filled 2020-07-07: qty 1

## 2020-07-07 MED ORDER — SODIUM BICARBONATE 8.4 % IV SOLN
INTRAVENOUS | Status: DC
  Filled 2020-07-07 (×4): qty 150
  Filled 2020-07-07 (×2): qty 850
  Filled 2020-07-07: qty 150
  Filled 2020-07-07: qty 850
  Filled 2020-07-07: qty 150

## 2020-07-07 MED ORDER — PROMETHAZINE HCL 25 MG/ML IJ SOLN
12.5000 mg | Freq: Four times a day (QID) | INTRAMUSCULAR | Status: AC | PRN
  Administered 2020-07-07 (×2): 12.5 mg via INTRAVENOUS
  Filled 2020-07-07 (×2): qty 1

## 2020-07-07 NOTE — Progress Notes (Signed)
PROGRESS NOTE  Haley Mcmillan [id number] DOB: 19-Jan-1934   PCP: Hoyt Koch, MD  Patient is from: Home.  Lives with husband.  Independent  DOA: 07/06/2020 LOS: 1  Chief complaints: AKI and anemia  Brief Narrative / Interim history: 87 y.o. 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 Burr Medico, CKD-3A, IDA, HTN, HLD and diminished hearing admitted from oncology office with AKI and anemia. Cr 13.23 from 1.75 three weeks prior.  BUN 80.  Hgb 5.0 from 9.2 three weeks prior. CT abdomen and pelvis showed 27.0 cm cystic mass in central abdomen corresponding to patient's known GIST which was 21.4 cm 3 weeks prior.   Subjective: Seen and examined earlier this morning later this afternoon.  Patient's daughter at bedside this afternoon.  No complaints but just uncomfortable in hospital bed.  Denies chest pain, dyspnea, nausea or vomiting.  Objective: Vitals:   07/07/20 0052 07/07/20 0511 07/07/20 0531 07/07/20 1427  BP: (!) 90/40 (!) 87/54 (!) 94/58 (!) 93/56  Pulse: 70 77  74  Resp:  17  18  Temp: 98.3 F (36.8 C) 98.3 F (36.8 C)  98.6 F (37 C)  TempSrc: Oral Oral  Oral  SpO2: 93% 90%  95%  Weight:      Height:        Intake/Output Summary (Last 24 hours) at 07/07/2020 1436 Last data filed at 07/07/2020 [id number] Gross per 24 hour  Intake 1628.05 ml  Output --  Net 1628.05 ml   Filed Weights   07/06/20 1600  Weight: 72.4 kg    Examination:  GENERAL: Chronically ill-appearing.  No apparent distress. HEENT: MMM.  Vision and hearing grossly intact.  NECK: Supple.  No apparent JVD.  RESP:  No IWOB.  Fair aeration bilaterally. CVS:  RRR. Heart sounds normal.  ABD/GI/GU: BS+. Abd firm and distended.  Dull to percussion.  Nontender. MSK/EXT:  Moves extremities. No apparent deformity. No edema.  SKIN: no apparent skin lesion or wound NEURO: Awake, alert and oriented appropriately.  No apparent focal neuro deficit. PSYCH: Calm.  Normal affect.  Procedures:  None  Microbiology summarized: COVID-19 PCR nonreactive.  Assessment & Plan: AKI on CKD-3A/azotemia-  Recent Labs    09/26/19 0944 12/29/19 1241 03/30/20 1306 06/12/20 1236 07/06/20 1308 07/07/20 0535  BUN 9 9 9 13  80* 83*  CREATININE 1.22* 1.36* 1.22* 1.75* 13.23* 12.88*  Suspect prerenal from poor oral hydration and possible mass-effect of GIST on renal vasculature.CT did not show urinary obstructions. She is also on lisinopril which could contribute.  She denies NSAID use.  No indication for emergent dialysis.  Doubt candidacy either. -Check FeNA, urinalysis, UPC-requested RN to send urine samples. -Change IV NS to bicarb -Strict intake and output. -Hold home lisinopril and avoid nephrotoxic meds -Discussed with nephrology, Dr. Carolin Sicks who is in agreement with the above.  Normocytic anemia in patient with history of iron deficiency anemia-  Recent Labs    09/26/19 0944 12/29/19 1241 03/30/20 1306 06/12/20 1236 07/06/20 1308 07/07/20 0535  HGB 12.1 11.6* 11.3* 9.2* 5.0* 8.5*  Likely due to [place], which was recently increased -Follow haptoglobin and iron studies. -Check Hemoccult.  Anion gap metabolic acidosis: Likely due to azotemia. -Sodium bicarb as above  Stage IV gastrointestinal stromal tumor-followed by Dr. Burr Medico. CT abdomen and pelvis showed 27.0 cm cystic mass in central abdomen corresponding to patient's known GIST which was 21.4 cm 3 weeks prior.  -Will wait on oncology input about treatment options  and prognosis.  Seems to be getting out hand -Continue holding [place] for now  Diarrhea/stool incontinence-could be related to [place].  Clinically low suspicion for C. difficile. -IV fluid as above -Continue holding [place]  Essential hypertension: Soft blood pressures.  93/56. -Continue holding home antihypertensive meds. -IV fluid as above  Goal of care discussion-patient with stage IV GIST that is rapidly growing  despite increment in the [place].  Now with renal failure and significant anemia.  Appears to have very grim prognosis.  I discussed my concern with patient's daughter at bedside.  Patient's daughter seems to be understanding but would like to hear from Dr. Burr Medico.  Patient elected to be DNR/DNI after we discussed the pros and cons of CPR/intubation and mechanical ventilation on admission. -Palliative care consulted.  Body mass index is 25.01 kg/m.         DVT prophylaxis:  Resume subcu heparin  Code Status: DNR/DNI Family Communication: Updated patient's daughter at the bedside Status is: Inpatient  Remains inpatient appropriate because:Unsafe d/c plan, IV treatments appropriate due to intensity of illness or inability to take PO and Inpatient level of care appropriate due to severity of illness   Dispo: The patient is from: Home              Anticipated d/c is to: To be determined              Anticipated d/c date is: 3 days              Patient currently is not medically stable to d/c.   Difficult to place patient No       Consultants:  Oncology Nephrology over the phone Palliative medicine   Sch Meds:  Scheduled Meds: . sodium chloride   Intravenous Once  . feeding supplement (NEPRO CARB STEADY)  237 mL Oral BID BM   Continuous Infusions: . sodium chloride 488 mL/hr at 07/07/20 0618   PRN Meds:.acetaminophen **OR** acetaminophen, fentaNYL (SUBLIMAZE) injection, ondansetron **OR** ondansetron (ZOFRAN) IV, promethazine  Antimicrobials: Anti-infectives (From admission, onward)   None       I have personally reviewed the following labs and images: CBC: Recent Labs  Lab 07/06/20 1308 07/07/20 0535  WBC 6.4 7.2  NEUTROABS 5.6  --   HGB 5.0* 8.5*  HCT 15.8* 26.6*  MCV 110.5* 98.9  PLT 184 178   BMP &GFR Recent Labs  Lab 07/06/20 1308 07/07/20 0535  NA 136 137  K 4.8 4.7  CL 103 106  CO2 16* 17*  GLUCOSE 134* 97  BUN 80* 83*  CREATININE 13.23*  12.88*  CALCIUM 8.3* 8.3*   Estimated Creatinine Clearance: 3.2 mL/min (A) (by C-G formula based on SCr of 12.88 mg/dL (H)). Liver & Pancreas: Recent Labs  Lab 07/06/20 1308  AST 20  ALT <6  ALKPHOS 48  BILITOT 0.4  PROT 6.7  ALBUMIN 3.2*   No results for input(s): LIPASE, AMYLASE in the last 168 hours. No results for input(s): AMMONIA in the last 168 hours. Diabetic: No results for input(s): HGBA1C in the last 72 hours. No results for input(s): GLUCAP in the last 168 hours. Cardiac Enzymes: No results for input(s): CKTOTAL, CKMB, CKMBINDEX, TROPONINI in the last 168 hours. No results for input(s): PROBNP in the last 8760 hours. Coagulation Profile: No results for input(s): INR, PROTIME in the last 168 hours. Thyroid Function Tests: No results for input(s): TSH, T4TOTAL, FREET4, T3FREE, THYROIDAB in the last 72 hours. Lipid Profile: No results for input(s): CHOL,  HDL, LDLCALC, TRIG, CHOLHDL, LDLDIRECT in the last 72 hours. Anemia Panel: Recent Labs    07/06/20 1356 07/06/20 1357 07/07/20 0535  VITAMINB12  --  509  --   FOLATE  --   --  6.3  RETICCTPCT 1.4  --   --    Urine analysis:    Component Value Date/Time   COLORURINE YELLOW 04/08/2016 1752   APPEARANCEUR CLOUDY (A) 04/08/2016 1752   LABSPEC 1.010 04/08/2016 1752   PHURINE 7.5 04/08/2016 1752   GLUCOSEU NEGATIVE 04/08/2016 1752   HGBUR SMALL (A) 04/08/2016 1752   BILIRUBINUR NEGATIVE 04/08/2016 1752   KETONESUR NEGATIVE 04/08/2016 1752   PROTEINUR NEGATIVE 04/08/2016 1752   NITRITE NEGATIVE 04/08/2016 1752   LEUKOCYTESUR LARGE (A) 04/08/2016 1752   Sepsis Labs: Invalid input(s): PROCALCITONIN, LACTICIDVEN  Microbiology: Recent Results (from the past 240 hour(s))  SARS CORONAVIRUS 2 (TAT 6-24 HRS) Nasopharyngeal Nasopharyngeal Swab     Status: None   Collection Time: 07/06/20  5:01 PM   Specimen: Nasopharyngeal Swab  Result Value Ref Range Status   SARS Coronavirus 2 NEGATIVE NEGATIVE Final     Comment: (NOTE) SARS-CoV-2 target nucleic acids are NOT DETECTED.  The SARS-CoV-2 RNA is generally detectable in upper and lower respiratory specimens during the acute phase of infection. Negative results do not preclude SARS-CoV-2 infection, do not rule out co-infections with other pathogens, and should not be used as the sole basis for treatment or other patient management decisions. Negative results must be combined with clinical observations, patient history, and epidemiological information. The expected result is Negative.  Fact Sheet for Patients: HairSlick.no  Fact Sheet for Healthcare Providers: quierodirigir.com  This test is not yet approved or cleared by the [place] FDA and  has been authorized for detection and/or diagnosis of SARS-CoV-2 by FDA under an Emergency Use Authorization (EUA). This EUA will remain  in effect (meaning this test can be used) for the duration of the COVID-19 declaration under Se ction 564(b)(1) of the Act, 21 U.S.C. section 360bbb-3(b)(1), unless the authorization is terminated or revoked sooner.  Performed at [facility], [address], [place], [place] [address]     Radiology Studies: CT ABDOMEN PELVIS WO CONTRAST  Result Date: 07/06/2020 CLINICAL DATA:  Acute renal failure.  Malignant GI stromal tumor. EXAM: CT ABDOMEN AND PELVIS WITHOUT CONTRAST TECHNIQUE: Multidetector CT imaging of the abdomen and pelvis was performed following the standard protocol without IV contrast. COMPARISON:  06/12/2020 FINDINGS: Lower chest: Small left and trace right pleural effusions, progressive. Associated lower lobe opacities, likely atelectasis, although superimposed mild right lower lobe infection/pneumonia is possible. Hepatobiliary: Scattered low-density liver lesions. A dominant 2.1 cm lesion in the posterior right hepatic lobe (series 2/image 19) is unchanged from most recent CT but  progressive from 2020. A 13 mm lesion in the high right hepatic dome (series 2/image 13) is also new from 2020. This raises concern for metastatic disease. Gallbladder is grossly unremarkable. No intrahepatic or extrahepatic ductal dilatation. Pancreas: Within normal limits. Spleen: Within normal limits. Adrenals/Urinary Tract: Adrenal glands are within normal limits. Kidneys are within normal limits. No renal, ureteral, or bladder calculi. No hydronephrosis. Bladder is underdistended but grossly unremarkable. Stomach/Bowel: Tiny hiatal hernia. Large mass (described below) abuts and may arise from the undersurface of the gastric antrum. No evidence of bowel obstruction. Appendix is not discretely visualized. Vascular/Lymphatic: No evidence of abdominal aortic aneurysm. Atherosclerotic calcifications of the abdominal aorta and branch vessels. No suspicious abdominopelvic lymphadenopathy. Reproductive: Uterus is  poorly visualized on the current CT. No adnexal masses. Other: 17.8 x 27.0 x 25.5 cm predominantly cystic mass in the central abdomen (series 2/image 50), corresponding to the patient's known GI stromal tumor. This is poorly evaluated on the current noncontrast CT but abuts the undersurface of the stomach. This previously measured 12.6 x 21.4 x 19.8 cm, reflecting significant interval growth. Small volume abdominopelvic ascites. Mild peritoneal disease is suspected beneath the anterior abdominal wall (for example, series 2/images 42 and 56), although this remains equivocal/poorly evaluated. Musculoskeletal: Mild degenerative changes of the lower thoracic spine. IMPRESSION: No renal, ureteral, or bladder calculi.  No hydronephrosis. 27.0 cm predominantly cystic mass in the central abdomen, corresponding to the patient's known GI stromal tumor, previously 21.4 cm. Suspected hepatic metastases. Suspected mild peritoneal disease, although poorly evaluated. Small volume abdominopelvic ascites. Small volume  abdominopelvic ascites. Small left and trace right pleural effusions, progressive. Associated lower lobe opacities, likely atelectasis, although superimposed mild right lower lobe pneumonia is possible. Electronically Signed   By: Julian Hy M.D.   On: 07/06/2020 18:43      Aylani Spurlock T. Heavener  If 7PM-7AM, please contact night-coverage [email or web address] 07/07/2020, 2:36 PM

## 2020-07-07 NOTE — Progress Notes (Signed)
Initial Nutrition Assessment  DOCUMENTATION CODES:   Not applicable  INTERVENTION:   Nepro Shake po BID, each supplement provides 425 kcal and 19 grams protein  Liberalize diet   NUTRITION DIAGNOSIS:   Increased nutrient needs related to cancer and cancer related treatments (GIST) as evidenced by estimated needs.  GOAL:   Patient will meet greater than or equal to 90% of their needs  MONITOR:   PO intake,Supplement acceptance,Labs,Weight trends,Skin,I & O's  REASON FOR ASSESSMENT:   Malnutrition Screening Tool    ASSESSMENT:   87 y.o. 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 Burr Medico, CKD-3A, IDA, HTN, HLD and diminished hearing admitted from oncology office due to acute renal failure and symptomatic anemia. Pt found to have new cystic mass  RD working remotely.  Did not attempt to call pt as pt is hard of hearing. Per family report, pt eating fair pta but had decreased interest in drinking. Pt with decreased appetite and oral intake in hospital; pt is drinking some Nepro supplements. RD will liberalize pt's diet to try and increase her oral intake. Recommend continue supplements. Per chart, pt appears fairly weight stable at baseline. Palliative care consult pending.    Medications reviewed and include: heparin, Na bicarb in 5% dextrose  Labs reviewed: K 4.7 wnl, BUN 83(H), creat 12.88(H) Hgb 8.5(L), Hct 26.6(L)  NUTRITION - FOCUSED PHYSICAL EXAM: Unable to perform at this time   Diet Order:   Diet Order            Diet regular Room service appropriate? Yes; Fluid consistency: Thin  Diet effective now                EDUCATION NEEDS:   No education needs have been identified at this time  Skin:  Skin Assessment: Reviewed RN Assessment  Last BM:  1/22- TYPE 7  Height:   Ht Readings from Last 1 Encounters:  07/06/20 5\' 7"  (1.702 m)    Weight:   Wt Readings from Last 1 Encounters:  07/06/20 72.4 kg     Ideal Body Weight:  61.36 kg  BMI:  Body mass index is 25.01 kg/m.  Estimated Nutritional Needs:   Kcal:  1600-1800kcal/day  Protein:  80-90g/day  Fluid:  1.6-1.9L/day  Koleen Distance MS, RD, LDN Please refer to [facility] for RD and/or RD on-call/weekend/after hours pager

## 2020-07-07 NOTE — Progress Notes (Signed)
HEMATOLOGY-ONCOLOGY PROGRESS NOTE  SUBJECTIVE: Patient went for imaging. Hence I did not examine. Chart reviewed.  Oncology History Overview Note  Malignant gastrointestinal stromal tumor (GIST) of stomach ([place])   Staging form: Gastric Stromal Tumor - Gastric Gist, AJCC 7th Edition   - Clinical stage from 04/09/2016: Stage IV (T3, N0, M1, Mitotic rate: Low) - Signed by Truitt Merle, MD on 04/20/2016    Malignant gastrointestinal stromal tumor (GIST) of stomach ([place])  04/09/2016 Initial Diagnosis   Malignant gastrointestinal stromal tumor (GIST) of stomach ([place])   04/09/2016 Procedure   EGD showed a large fungating and infiltrative very deeply ulcerated and necrotic malignant appearing mass in the gastric fundus.   04/09/2016 Initial Biopsy   Gastric mass biopsy showed gastrointestinal stromal tumor, I feel mitosis and necrosis, intermediate to high-grade.   04/09/2016 Imaging   CT chest, abdomen and pelvis with contrast showed a large exophytic mass in the proximal lateral stomach measuring 10 x 7.4 cm, nodular lesions within omentum and multiple liver nodules are highly suspicious for metastatic disease.   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 Today's study demonstrates a positive response to therapy with regression of the primary gastric neoplasm, as well as regression of numerous intraperitoneal metastatic lesions. Additionally, the liver lesions seen on the prior study appear far more well-defined, decreased in size and are much lower attenuation, suggesting internal areas of necrosis. 2. Several small pulmonary nodules are noted,  some of which are new compared to the prior examination. These are nonspecific, but metastatic disease to the lungs is not excluded. Attention on follow-up imaging is recommended. 3. Importantly, there is also widespread ground-glass attenuation and septal thickening in the lungs bilaterally which is new compared to the prior study. In the appropriate clinical setting, this could reflect a drug reaction. Other differential considerations include atypical infection, or even developing interstitial lung disease. Clinical correlation is suggested. 4. Aortic atherosclerosis, in addition to right coronary artery disease. 5. Mild diffuse bronchial wall thickening with mild centrilobular and paraseptal emphysema; imaging findings suggestive of underlying COPD. 6. Additional incidental findings, as above.   09/22/2016 Imaging    CT CAP W CONTRAST   IMPRESSION: 1. Nodular appearing GI stromal tumor along the posterior gastric wall with associated peritoneal implants and hepatic metastases, stable from 07/16/2016. 2. Improving mid and lower lung zone predominant peribronchovascular ground-glass, possibly related to drug therapy. 3. A few scattered pulmonary nodules are stable. 4. Aortic atherosclerosis (ICD10-170.0). Coronary artery calcification.   01/14/2017 Imaging   CT CAP w Contrast  IMPRESSION: 1. Further decrease in size of large GI stromal tumor arising from the greater curvature of the stomach with improvement in hepatic and multifocal peritoneal metastases. 2. Further improvement in lower lung predominant peribronchovascular ground-glass density, probably inflammatory (drug reaction or or infectious). 3. No acute findings. 4.  Aortic Atherosclerosis (ICD10-I70.0).   05/08/2017 Imaging   IMPRESSION: 1. Overall, today's examination is very similar to recent prior studies again demonstrating a large proximal gastric mass with adjacent gastrosplenic ligament lymphadenopathy,  multiple treated lesions in the liver, and other intraperitoneal metastases, as discussed above. No definite new sites of  metastatic disease are otherwise noted. 2. Aortic atherosclerosis, in addition to right coronary artery disease. 3. Mild cardiomegaly. 4. There are calcifications of the aortic valve. Echocardiographic correlation for evaluation of potential valvular dysfunction may be warranted if clinically indicated. 5. Additional incidental findings, as above. Aortic Atherosclerosis (ICD10-I70.0).   09/07/2017 Imaging   CT CAP W Contrast 09/07/17 IMPRESSION: 1. Large mass extending off the greater curvature of the stomach is similar to prior. Majority of nodularity within the adjacent mesentery is similar. There is one adjacent nodule which has increased in size when compared to prior exam. 2. Grossly unchanged treated hepatic lesions.   01/04/2018 Imaging   CT CAP WO COntrast 01/04/18 IMPRESSION: 1. No substantial interval change in exam. 2. Irregular mass at the proximal stomach is similar to prior. Scattered soft tissue lesions in the perigastric reason, omentum, and mesentery are similar to prior. No generalized trend towards worsening or improving disease. No new or progressive findings on today's study. 3. Stable appearance multiple hepatic lesions. 4.  Aortic Atherosclerois (ICD10-170.0)   05/05/2018 Imaging   CT CAP 05/05/18  IMPRESSION: 1. Mild response to therapy, as evidenced by decreased size of many soft tissue lesions along the greater curvature of the stomach, as well as within the omentum. 2. Similar hepatic metastasis. 3.  No acute process or evidence of metastatic disease in the chest. 4.  Aortic Atherosclerosis (ICD10-I70.0). 5. Pulmonary artery enlargement suggests pulmonary arterial hypertension.   09/28/2018 Imaging   CT CAP 09/28/18  IMPRESSION: 1. No change in soft tissue nodularity about the greater curvature of the stomach and pancreatic  tail (series 2, image 53).  2. No change in noncontrast appearance of numerous small hypodense metastatic lesions of the liver.  3. New 4 mm irregular pulmonary nodule of the right pulmonary apex (series 6, image 32), nonspecific. Attention on follow-up. Additional stable small pulmonary nodules and ground-glass opacities, for example 3 mm adjacent nodules of the right upper lobe (series 6, image 51).   03/29/2019 Imaging   CT CAP WO Contrast  IMPRESSION: 1. Calcified nodular soft tissue seen along the posterior stomach and tail of pancreas is stable in the interval. No new or progressive interval findings. 2. Numerous low-density liver lesions compatible with metastatic disease. Overall appearance is stable. 3. 4 mm irregular right apical pulmonary nodule identified previously as new is unchanged in the interval. Continued attention on follow-up recommended. 4.  Aortic Atherosclerois (ICD10-170.0)   09/26/2019 Imaging   CT CAP w Contrast  IMPRESSION: Chest Impression:   1. No evidence of thoracic metastasis.   Abdomen / Pelvis Impression:   1. Stable serosal nodularity along the greater curvature of the stomach. 2. Stable small round low-density lesions in liver. No significant change from CT 07/16/2016. 3. No evidence disease progression the abdomen or pelvis.   06/12/2020 Imaging   CT AP  IMPRESSION: New large lobulated soft tissue mass in the left abdomen, consistent with recurrent malignancy or metastatic disease.   New moderate ascites. Diffuse mesenteric and body wall edema also noted. Stable nodular soft tissue densities in the gastrosplenic ligament along the greater curvature of the proximal gastric body, consistent with metastatic disease.   No significant change in multiple small low-attenuation liver lesions.   Small hiatal hernia.   Aortic Atherosclerosis (ICD10-I70.0).     OBJECTIVE: REVIEW OF SYSTEMS:  Not obtained  PHYSICAL EXAMINATION:  Not Performed   Vitals:   07/07/20 0511 07/07/20 0531  BP: (!) 87/54 (!) 94/58  Pulse: 77  Resp: 17   Temp: 98.3 F (36.8 C)   SpO2: 90%    Filed Weights   07/06/20 1600  Weight: 159 lb 11.2 oz (72.4 kg)    LABORATORY DATA:  I have reviewed the data as listed CMP Latest Ref Rng & Units 07/07/2020 07/06/2020 06/12/2020  Glucose 70 - 99 mg/dL 97 134(H) 94  BUN 8 - 23 mg/dL 83(H) 80(H) 13  Creatinine 0.44 - 1.00 mg/dL 12.88(H) 13.23(HH) 1.75(H)  Sodium 135 - 145 mmol/L 137 136 145  Potassium 3.5 - 5.1 mmol/L 4.7 4.8 3.5  Chloride 98 - 111 mmol/L 106 103 112(H)  CO2 22 - 32 mmol/L 17(L) 16(L) 25  Calcium 8.9 - 10.3 mg/dL 8.3(L) 8.3(L) 9.1  Total Protein 6.5 - 8.1 g/dL - 6.7 7.0  Total Bilirubin 0.3 - 1.2 mg/dL - 0.4 0.5  Alkaline Phos 38 - 126 U/L - 48 50  AST 15 - 41 U/L - 20 20  ALT 0 - 44 U/L - <6 7    Lab Results  Component Value Date   WBC 7.2 07/07/2020   HGB 8.5 (L) 07/07/2020   HCT 26.6 (L) 07/07/2020   MCV 98.9 07/07/2020   PLT 178 07/07/2020   NEUTROABS 5.6 07/06/2020    ASSESSMENT AND PLAN: 1. Malignant GIST with peritoneal and liver mets: Gleevec is being held because of severe anemia and GI toxicities and renal failure. 2. Severe Anemia: S/P PRBC. Today Hb is 8.5 3. Renal failure: CT abd and pelvis ordered to rule out obstruction, IV fluids and nephrology consult 4. Ascites: Dr.Feng felt she might need paracentesis

## 2020-07-08 ENCOUNTER — Inpatient Hospital Stay: Payer: Medicare PPO

## 2020-07-08 ENCOUNTER — Inpatient Hospital Stay (HOSPITAL_COMMUNITY): Payer: Medicare PPO

## 2020-07-08 LAB — CBC
HCT: 26.1 % — ABNORMAL LOW (ref 36.0–46.0)
Hemoglobin: 8.5 g/dL — ABNORMAL LOW (ref 12.0–15.0)
MCH: 31.1 pg (ref 26.0–34.0)
MCHC: 32.6 g/dL (ref 30.0–36.0)
MCV: 95.6 fL (ref 80.0–100.0)
Platelets: 187 10*3/uL (ref 150–400)
RBC: 2.73 MIL/uL — ABNORMAL LOW (ref 3.87–5.11)
RDW: 22.4 % — ABNORMAL HIGH (ref 11.5–15.5)
WBC: 7.2 10*3/uL (ref 4.0–10.5)
nRBC: 0 % (ref 0.0–0.2)

## 2020-07-08 LAB — RENAL FUNCTION PANEL
Albumin: 2.9 g/dL — ABNORMAL LOW (ref 3.5–5.0)
Anion gap: 11 (ref 5–15)
BUN: 86 mg/dL — ABNORMAL HIGH (ref 8–23)
CO2: 24 mmol/L (ref 22–32)
Calcium: 8.3 mg/dL — ABNORMAL LOW (ref 8.9–10.3)
Chloride: 103 mmol/L (ref 98–111)
Creatinine, Ser: 12.88 mg/dL — ABNORMAL HIGH (ref 0.44–1.00)
GFR, Estimated: 3 mL/min — ABNORMAL LOW (ref >60)
Glucose, Bld: 153 mg/dL — ABNORMAL HIGH (ref 70–99)
Phosphorus: 6.6 mg/dL — ABNORMAL HIGH (ref 2.5–4.6)
Potassium: 4.3 mmol/L (ref 3.5–5.1)
Sodium: 138 mmol/L (ref 135–145)

## 2020-07-08 LAB — MAGNESIUM: Magnesium: 1.6 mg/dL — ABNORMAL LOW (ref 1.7–2.4)

## 2020-07-08 MED ORDER — ONDANSETRON HCL 4 MG/2ML IJ SOLN
4.0000 mg | Freq: Four times a day (QID) | INTRAMUSCULAR | Status: DC
  Administered 2020-07-08 – 2020-07-11 (×10): 4 mg via INTRAVENOUS
  Filled 2020-07-08 (×10): qty 2

## 2020-07-08 MED ORDER — ONDANSETRON HCL 4 MG PO TABS
4.0000 mg | ORAL_TABLET | Freq: Four times a day (QID) | ORAL | Status: DC
  Administered 2020-07-09: 4 mg via ORAL
  Filled 2020-07-08: qty 1

## 2020-07-08 MED ORDER — PROCHLORPERAZINE EDISYLATE 10 MG/2ML IJ SOLN
5.0000 mg | Freq: Four times a day (QID) | INTRAMUSCULAR | Status: DC | PRN
  Administered 2020-07-08: 5 mg via INTRAVENOUS
  Filled 2020-07-08 (×2): qty 2

## 2020-07-08 NOTE — Progress Notes (Signed)
PROGRESS NOTE  Haley Mcmillan YHC:[id number] DOB: 1934/06/06   PCP: Hoyt Koch, MD  Patient is from: Home.  Lives with husband.  Independent  DOA: 07/06/2020 LOS: 2  Chief complaints: AKI and anemia  Brief Narrative / Interim history: 87 y.o. 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 Burr Medico, CKD-3A, IDA, HTN, HLD and diminished hearing admitted from oncology office with AKI and anemia. Cr 13.23 from 1.75 three weeks prior.  BUN 80.  Hgb 5.0 from 9.2 three weeks prior. CT abdomen and pelvis showed 27.0 cm cystic mass in central abdomen corresponding to patient's known GIST which was 21.4 cm 3 weeks prior.   Subjective: Seen and examined earlier this morning.  No major events overnight of this morning.  She reports an episode of emesis and bowel movement this morning.  Denies chest pain, dyspnea or abdominal pain.  Creatinine remains elevated.  H&H stable.  Objective: Vitals:   07/07/20 0531 07/07/20 1427 07/07/20 2050 07/08/20 0558  BP: (!) 94/58 (!) 93/56 (!) 101/48 (!) 97/53  Pulse:  74 78 84  Resp:  18 20 16   Temp:  98.6 F (37 C) 98.9 F (37.2 C) 98.1 F (36.7 C)  TempSrc:  Oral Oral Oral  SpO2:  95% 93% 92%  Weight:      Height:        Intake/Output Summary (Last 24 hours) at 07/08/2020 1259 Last data filed at 07/08/2020 0956 Gross per 24 hour  Intake 2150.99 ml  Output 2 ml  Net 2148.99 ml   Filed Weights   07/06/20 1600  Weight: 72.4 kg    Examination:  GENERAL: Chronically ill-appearing.  No apparent distress. HEENT: MMM.  Vision and hearing grossly intact.  NECK: Supple.  No apparent JVD.  RESP:  No IWOB.  Fair aeration bilaterally. CVS:  RRR. Heart sounds normal.  ABD/GI/GU: BS+.  Abdomen firm and distended.  Dull to percussion.  Nontender. MSK/EXT:  Moves extremities. No apparent deformity. No edema.  SKIN: no apparent skin lesion or wound NEURO: Awake, alert and oriented appropriately.  No  apparent focal neuro deficit. PSYCH: Flat affect.  Procedures:  None  Microbiology summarized: COVID-19 PCR nonreactive.  Assessment & Plan: AKI on CKD-3A/azotemia-suspect prerenal from poor oral hydration and also possible mass-effect from GIST.  CT did not show urinary obstruction.  She is also on lisinopril which could contribute.  Denies NSAID.  Urinalysis and UPC without significant finding. Recent Labs    09/26/19 0944 12/29/19 1241 03/30/20 1306 06/12/20 1236 07/06/20 1308 07/07/20 0535 07/08/20 0613  BUN 9 9 9 13  80* 83* 86*  CREATININE 1.22* 1.36* 1.22* 1.75* 13.23* 12.88* 12.88*  -No indication for emergent HD.  Doubt candidacy either.  Discussed with nephrology, Dr. Carolin Sicks on 1/22 -Continue IV sodium bicarb per nephrology -Strict intake and output. -Continue holding home lisinopril  Normocytic anemia in patient with history of iron deficiency anemia-likely due to [place] which was recently increased.  Hgb stable after transfusion. Recent Labs    09/26/19 0944 12/29/19 1241 03/30/20 1306 06/12/20 1236 07/06/20 1308 07/07/20 0535 07/08/20 0613  HGB 12.1 11.6* 11.3* 9.2* 5.0* 8.5* 8.5*  -Follow haptoglobin and iron studies. -Continue monitoring  Anion gap metabolic acidosis: Likely due to azotemia. -Sodium bicarb as above  Stage IV gastrointestinal stromal tumor-followed by Dr. Burr Medico. CT abdomen and pelvis showed 27.0 cm cystic mass in central abdomen corresponding to patient's known GIST which was 21.4 cm 3 weeks prior.  -Will  wait on oncology input about treatment options and prognosis.  Seems to be getting out hand -Continue holding [place] for now  Diarrhea/stool incontinence-could be related to [place].  Clinically low suspicion for C. difficile. -IV fluid as above -Continue holding [place]  Essential hypertension: Soft blood pressures, 97/53 -Continue holding home antihypertensive meds. -IV fluid as above  Hypomagnesemia -Replenish and  repeat check  Hyperphosphatemia: P 6.6.  Likely due to renal failure.  Goal of care discussion-patient with stage IV GIST that is rapidly growing despite increment in the [place].  Now with renal failure and significant anemia.  Appears to have very grim prognosis.  I discussed my concern with patient's daughter at bedside on 1/22.  Patient's daughter seems to be understanding but would like to hear from Dr. Burr Medico.  Patient elected to be DNR/DNI after we discussed the pros and cons of CPR/intubation and mechanical ventilation on admission. -Palliative care consulted.  Body mass index is 25.01 kg/m. Nutrition Problem: Increased nutrient needs Etiology: cancer and cancer related treatments (GIST) Signs/Symptoms: estimated needs     DVT prophylaxis:  Resume subcu heparin  Code Status: DNR/DNI Family Communication: Updated patient's daughter at the bedside Status is: Inpatient  Remains inpatient appropriate because:Unsafe d/c plan, IV treatments appropriate due to intensity of illness or inability to take PO and Inpatient level of care appropriate due to severity of illness   Dispo: The patient is from: Home              Anticipated d/c is to: To be determined              Anticipated d/c date is: 3 days              Patient currently is not medically stable to d/c.   Difficult to place patient No       Consultants:  Oncology Nephrology over the phone Palliative medicine   Sch Meds:  Scheduled Meds:  sodium chloride   Intravenous Once   feeding supplement (NEPRO CARB STEADY)  237 mL Oral BID BM   Continuous Infusions:  sodium bicarbonate (isotonic) 150 mEq in D5W 1000 mL infusion 125 mL/hr at 07/08/20 0846   PRN Meds:.acetaminophen **OR** acetaminophen, fentaNYL (SUBLIMAZE) injection, ondansetron **OR** ondansetron (ZOFRAN) IV  Antimicrobials: Anti-infectives (From admission, onward)   None       I have personally reviewed the following labs and  images: CBC: Recent Labs  Lab 07/06/20 1308 07/07/20 0535 07/08/20 0613  WBC 6.4 7.2 7.2  NEUTROABS 5.6  --   --   HGB 5.0* 8.5* 8.5*  HCT 15.8* 26.6* 26.1*  MCV 110.5* 98.9 95.6  PLT 184 178 187   BMP &GFR Recent Labs  Lab 07/06/20 1308 07/07/20 0535 07/08/20 0613  NA 136 137 138  K 4.8 4.7 4.3  CL 103 106 103  CO2 16* 17* 24  GLUCOSE 134* 97 153*  BUN 80* 83* 86*  CREATININE 13.23* 12.88* 12.88*  CALCIUM 8.3* 8.3* 8.3*  MG  --   --  1.6*  PHOS  --   --  6.6*   Estimated Creatinine Clearance: 3.2 mL/min (A) (by C-G formula based on SCr of 12.88 mg/dL (H)). Liver & Pancreas: Recent Labs  Lab 07/06/20 1308 07/08/20 0613  AST 20  --   ALT <6  --   ALKPHOS 48  --   BILITOT 0.4  --   PROT 6.7  --   ALBUMIN 3.2* 2.9*   No results for input(s): LIPASE, AMYLASE  in the last 168 hours. No results for input(s): AMMONIA in the last 168 hours. Diabetic: No results for input(s): HGBA1C in the last 72 hours. No results for input(s): GLUCAP in the last 168 hours. Cardiac Enzymes: No results for input(s): CKTOTAL, CKMB, CKMBINDEX, TROPONINI in the last 168 hours. No results for input(s): PROBNP in the last 8760 hours. Coagulation Profile: No results for input(s): INR, PROTIME in the last 168 hours. Thyroid Function Tests: No results for input(s): TSH, T4TOTAL, FREET4, T3FREE, THYROIDAB in the last 72 hours. Lipid Profile: No results for input(s): CHOL, HDL, LDLCALC, TRIG, CHOLHDL, LDLDIRECT in the last 72 hours. Anemia Panel: Recent Labs    07/06/20 1356 07/06/20 1357 07/07/20 0535  VITAMINB12  --  509  --   FOLATE  --   --  6.3  RETICCTPCT 1.4  --   --    Urine analysis:    Component Value Date/Time   COLORURINE YELLOW 07/07/2020 1530   APPEARANCEUR CLOUDY (A) 07/07/2020 1530   LABSPEC 1.011 07/07/2020 1530   PHURINE 5.0 07/07/2020 1530   GLUCOSEU NEGATIVE 07/07/2020 1530   HGBUR MODERATE (A) 07/07/2020 1530   BILIRUBINUR NEGATIVE 07/07/2020 [place] 07/07/2020 1530   PROTEINUR 30 (A) 07/07/2020 1530   NITRITE NEGATIVE 07/07/2020 1530   LEUKOCYTESUR SMALL (A) 07/07/2020 1530   Sepsis Labs: Invalid input(s): PROCALCITONIN, [place]  Microbiology: Recent Results (from the past 240 hour(s))  SARS CORONAVIRUS 2 (TAT 6-24 HRS) Nasopharyngeal Nasopharyngeal Swab     Status: None   Collection Time: 07/06/20  5:01 PM   Specimen: Nasopharyngeal Swab  Result Value Ref Range Status   SARS Coronavirus 2 NEGATIVE NEGATIVE Final    Comment: (NOTE) SARS-CoV-2 target nucleic acids are NOT DETECTED.  The SARS-CoV-2 RNA is generally detectable in upper and lower respiratory specimens during the acute phase of infection. Negative results do not preclude SARS-CoV-2 infection, do not rule out co-infections with other pathogens, and should not be used as the sole basis for treatment or other patient management decisions. Negative results must be combined with clinical observations, patient history, and epidemiological information. The expected result is Negative.  Fact Sheet for Patients: SugarRoll.be  Fact Sheet for Healthcare Providers: https://www.woods-mathews.com/  This test is not yet approved or cleared by the [place] FDA and  has been authorized for detection and/or diagnosis of SARS-CoV-2 by FDA under an Emergency Use Authorization (EUA). This EUA will remain  in effect (meaning this test can be used) for the duration of the COVID-19 declaration under Se ction 564(b)(1) of the Act, 21 U.S.C. section 360bbb-3(b)(1), unless the authorization is terminated or revoked sooner.  Performed at [facility], [place] [address], [place], [place] [address]     Radiology Studies: No results found.    Francisca Langenderfer T. [place]  If 7PM-7AM, please contact night-coverage [email or web address] 07/08/2020, 12:59 PM

## 2020-07-09 ENCOUNTER — Telehealth: Payer: Self-pay | Admitting: Hematology

## 2020-07-09 ENCOUNTER — Telehealth: Payer: Self-pay

## 2020-07-09 DIAGNOSIS — E883 Tumor lysis syndrome: Secondary | ICD-10-CM

## 2020-07-09 DIAGNOSIS — N179 Acute kidney failure, unspecified: Secondary | ICD-10-CM

## 2020-07-09 LAB — IRON AND TIBC
Iron: 45 ug/dL (ref 41–142)
Saturation Ratios: 20 % — ABNORMAL LOW (ref 21–57)
TIBC: 228 ug/dL — ABNORMAL LOW (ref 236–444)
UIBC: 182 ug/dL (ref 120–384)

## 2020-07-09 LAB — RENAL FUNCTION PANEL
Albumin: 2.5 g/dL — ABNORMAL LOW (ref 3.5–5.0)
Anion gap: 16 — ABNORMAL HIGH (ref 5–15)
BUN: 96 mg/dL — ABNORMAL HIGH (ref 8–23)
CO2: 27 mmol/L (ref 22–32)
Calcium: 8 mg/dL — ABNORMAL LOW (ref 8.9–10.3)
Chloride: 98 mmol/L (ref 98–111)
Creatinine, Ser: 12.44 mg/dL — ABNORMAL HIGH (ref 0.44–1.00)
GFR, Estimated: 3 mL/min — ABNORMAL LOW (ref >60)
Glucose, Bld: 136 mg/dL — ABNORMAL HIGH (ref 70–99)
Phosphorus: 5.3 mg/dL — ABNORMAL HIGH (ref 2.5–4.6)
Potassium: 3.8 mmol/L (ref 3.5–5.1)
Sodium: 141 mmol/L (ref 135–145)

## 2020-07-09 LAB — FERRITIN: Ferritin: 392 ng/mL — ABNORMAL HIGH (ref 11–307)

## 2020-07-09 LAB — MAGNESIUM: Magnesium: 1.5 mg/dL — ABNORMAL LOW (ref 1.7–2.4)

## 2020-07-09 LAB — HAPTOGLOBIN: Haptoglobin: 234 mg/dL (ref 41–333)

## 2020-07-09 LAB — URIC ACID: Uric Acid, Serum: 14.7 mg/dL — ABNORMAL HIGH (ref 2.5–7.1)

## 2020-07-09 MED ORDER — MAGNESIUM SULFATE 2 GM/50ML IV SOLN
2.0000 g | Freq: Once | INTRAVENOUS | Status: AC
  Administered 2020-07-09: 2 g via INTRAVENOUS
  Filled 2020-07-09: qty 50

## 2020-07-09 MED ORDER — MEGESTROL ACETATE 400 MG/10ML PO SUSP
600.0000 mg | Freq: Every day | ORAL | Status: DC
  Administered 2020-07-09 – 2020-07-10 (×2): 600 mg via ORAL
  Filled 2020-07-09 (×2): qty 20

## 2020-07-09 MED ORDER — SODIUM CHLORIDE 0.9 % IV SOLN
6.0000 mg | Freq: Once | INTRAVENOUS | Status: AC
  Administered 2020-07-09: 6 mg via INTRAVENOUS
  Filled 2020-07-09: qty 4

## 2020-07-09 NOTE — Telephone Encounter (Signed)
No 12/1 los. No changes made to pt's schedule.  

## 2020-07-09 NOTE — Progress Notes (Signed)
PROGRESS NOTE  Haley Mcmillan [id number] DOB: 07-28-1933   PCP: Hoyt Koch, MD  Patient is from: Home.  Lives with husband.  Independent  DOA: 07/06/2020 LOS: 3  Chief complaints: AKI and anemia  Brief Narrative / Interim history: 87 y.o. 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 Burr Medico, CKD-3A, IDA, HTN, HLD and diminished hearing admitted from oncology office with AKI and anemia. Cr 13.23 from 1.75 three weeks prior.  BUN 80.  Hgb 5.0 from 9.2 three weeks prior. CT abdomen and pelvis showed 27.0 cm cystic mass in central abdomen corresponding to patient's known GIST which was 21.4 cm 3 weeks prior.  Uric acid 14.7 raising concern for tumor lysis syndrome.  Received a dose of rasburicase 6 mg.  Oncology does not recommend HD given a poor prognosis from GIST.  Nephrology did not have much to offer but recommended sodium bicarbonate infusion.  Subjective: Seen and examined earlier this morning.  No major events overnight of this morning.  She denies dyspnea, pain, nausea or emesis although she appears to have some dried emesis on pillow cover.   Objective: Vitals:   07/08/20 1320 07/08/20 2031 07/09/20 0520 07/09/20 1345  BP: (!) 114/57 (!) 91/49 (!) 110/55 (!) 98/54  Pulse: 90 94 91 82  Resp:  16 16 16   Temp: 97.9 F (36.6 C) 100.3 F (37.9 C) 99.1 F (37.3 C) 97.8 F (36.6 C)  TempSrc: Oral Oral Oral Oral  SpO2: 91% 96% 96% 100%  Weight:      Height:        Intake/Output Summary (Last 24 hours) at 07/09/2020 1636 Last data filed at 07/09/2020 0930 Gross per 24 hour  Intake 60 ml  Output 500 ml  Net -440 ml   Filed Weights   07/06/20 1600  Weight: 72.4 kg    Examination:  GENERAL: Frail and chronically ill-appearing. HEENT: MMM.  Vision and hearing grossly intact.  NECK: Supple.  No apparent JVD.  RESP:  No IWOB.  Fair aeration bilaterally. CVS:  RRR. Heart sounds normal.  ABD/GI/GU: BS+.  Firm and  distended abdomen.  Dull to percussion.  Nontender. MSK/EXT:  Moves extremities. No apparent deformity. No edema.  SKIN: no apparent skin lesion or wound NEURO: Awake, alert and oriented appropriately.  No apparent focal neuro deficit. PSYCH: Flat affect.  Procedures:  None  Microbiology summarized: COVID-19 PCR nonreactive.  Assessment & Plan: AKI on CKD-3A/azotemia and possible uremia-suspect prerenal from poor oral hydration and also possible mass-effect from GIST.  CT did not show urinary obstruction.  Uric acid 14.7 suggesting tumor lysis syndrome.  She is also on lisinopril which could contribute.  Denies NSAID.  Urinalysis and UPC without significant finding. Recent Labs    09/26/19 0944 12/29/19 1241 03/30/20 1306 06/12/20 1236 07/06/20 1308 07/07/20 0535 07/08/20 0613 07/09/20 0621  BUN 9 9 9 13  80* 83* 86* 96*  CREATININE 1.22* 1.36* 1.22* 1.75* 13.23* 12.88* 12.88* 12.44*  -Not a candidate for HD given poor prognosis from a GIST. -Continue IV sodium bicarb -Received rasburicase 6 mg x 1 on 07/09/2020 -Monitor intake and output -Continue holding home lisinopril -Plan for home hospice.  [facility] -Palliative care consulted.  Stage IV gastrointestinal stromal tumor-followed by Dr. Burr Medico. CT abdomen and pelvis showed 27.0 cm cystic mass in central abdomen corresponding to patient's known GIST which was 21.4 cm 3 weeks prior.  Seems to be getting out of hand.  Poor prognosis per  oncology. -Continue holding [place] for now -Oncology recommended hospice.  Family contacting [facility]. -Palliative care consulted.  Acute hypoxemic respiratory failure likely due to pleural effusion and mass-effect from malignancy.  Appears comfortable on nasal cannula. -Continue supplemental oxygen -Thoracocentesis if respiratory distress  Normocytic anemia in patient with history of iron deficiency anemia-likely due to Gleevec and possibly tumor lysis.  Hgb stable after  transfusion. Recent Labs    09/26/19 0944 12/29/19 1241 03/30/20 1306 06/12/20 1236 07/06/20 1308 07/07/20 0535 07/08/20 0613  HGB 12.1 11.6* 11.3* 9.2* 5.0* 8.5* 8.5*  -Follow haptoglobin and iron studies. -Continue monitoring  Anion gap metabolic acidosis: Likely due to azotemia. -Continue sodium bicarb as above  Diarrhea/stool incontinence-could be related to [place].  Clinically low suspicion for C. difficile. -IV fluid as above -Continue holding [place]  Essential hypertension: Soft blood pressures, 97/53 -Continue holding home antihypertensive meds. -IV fluid as above  Hypomagnesemia -Replenish and repeat check  Hyperphosphatemia: P 6.6.  Likely due to renal failure.  Goal of care discussion-patient with stage IV GIST that is rapidly growing despite increment in the [place].  Now with renal failure and significant anemia.  Per oncology, poor prognosis from her GIST.  I discussed my concern with patient's daughter at bedside on 1/22.  Patient elected to be DNR/DNI after we discussed the pros and cons of CPR/intubation and mechanical ventilation on admission. -Palliative care consulted. -Per oncology, family contacting the [facility] for hospice and personal care  Body mass index is 25.01 kg/m. Nutrition Problem: Increased nutrient needs Etiology: cancer and cancer related treatments (GIST) Signs/Symptoms: estimated needs     DVT prophylaxis:  Resume subcu heparin  Code Status: DNR/DNI Family Communication: Updated patient's daughter at the bedside Status is: Inpatient  Remains inpatient appropriate because:Unsafe d/c plan, IV treatments appropriate due to intensity of illness or inability to take PO and Inpatient level of care appropriate due to severity of illness   Dispo: The patient is from: Home              Anticipated d/c is to: To be determined              Anticipated d/c date is: 2 days              Patient currently is not medically stable to d/c.    Difficult to place patient No       Consultants:  Oncology Nephrology over the phone Palliative medicine   Sch Meds:  Scheduled Meds: . sodium chloride   Intravenous Once  . feeding supplement (NEPRO CARB STEADY)  237 mL Oral BID BM  . megestrol  600 mg Oral Daily  . ondansetron  4 mg Oral Q6H   Or  . ondansetron (ZOFRAN) IV  4 mg Intravenous Q6H   Continuous Infusions: . sodium bicarbonate (isotonic) 150 mEq in D5W 1000 mL infusion 125 mL/hr at 07/09/20 0517   PRN Meds:.acetaminophen **OR** acetaminophen, fentaNYL (SUBLIMAZE) injection, prochlorperazine  Antimicrobials: Anti-infectives (From admission, onward)   None       I have personally reviewed the following labs and images: CBC: Recent Labs  Lab 07/06/20 1308 07/07/20 0535 07/08/20 0613  WBC 6.4 7.2 7.2  NEUTROABS 5.6  --   --   HGB 5.0* 8.5* 8.5*  HCT 15.8* 26.6* 26.1*  MCV 110.5* 98.9 95.6  PLT 184 178 187   BMP &GFR Recent Labs  Lab 07/06/20 1308 07/07/20 0535 07/08/20 0613 07/09/20 0621  NA 136 137 138 141  K 4.8 4.7  4.3 3.8  CL 103 106 103 98  CO2 16* 17* 24 27  GLUCOSE 134* 97 153* 136*  BUN 80* 83* 86* 96*  CREATININE 13.23* 12.88* 12.88* 12.44*  CALCIUM 8.3* 8.3* 8.3* 8.0*  MG  --   --  1.6* 1.5*  PHOS  --   --  6.6* 5.3*   Estimated Creatinine Clearance: 3.3 mL/min (A) (by C-G formula based on SCr of 12.44 mg/dL (H)). Liver & Pancreas: Recent Labs  Lab 07/06/20 1308 07/08/20 0613 07/09/20 0621  AST 20  --   --   ALT <6  --   --   ALKPHOS 48  --   --   BILITOT 0.4  --   --   PROT 6.7  --   --   ALBUMIN 3.2* 2.9* 2.5*   No results for input(s): LIPASE, AMYLASE in the last 168 hours. No results for input(s): AMMONIA in the last 168 hours. Diabetic: No results for input(s): HGBA1C in the last 72 hours. No results for input(s): GLUCAP in the last 168 hours. Cardiac Enzymes: No results for input(s): CKTOTAL, CKMB, CKMBINDEX, TROPONINI in the last 168 hours. No results  for input(s): PROBNP in the last 8760 hours. Coagulation Profile: No results for input(s): INR, PROTIME in the last 168 hours. Thyroid Function Tests: No results for input(s): TSH, T4TOTAL, FREET4, T3FREE, THYROIDAB in the last 72 hours. Lipid Profile: No results for input(s): CHOL, HDL, LDLCALC, TRIG, CHOLHDL, LDLDIRECT in the last 72 hours. Anemia Panel: Recent Labs    07/07/20 0535  FOLATE 6.3   Urine analysis:    Component Value Date/Time   COLORURINE YELLOW 07/07/2020 1530   APPEARANCEUR CLOUDY (A) 07/07/2020 1530   LABSPEC 1.011 07/07/2020 1530   PHURINE 5.0 07/07/2020 1530   GLUCOSEU NEGATIVE 07/07/2020 1530   HGBUR MODERATE (A) 07/07/2020 1530   BILIRUBINUR NEGATIVE 07/07/2020 1530   KETONESUR NEGATIVE 07/07/2020 1530   PROTEINUR 30 (A) 07/07/2020 1530   NITRITE NEGATIVE 07/07/2020 1530   LEUKOCYTESUR SMALL (A) 07/07/2020 1530   Sepsis Labs: Invalid input(s): PROCALCITONIN, [place]  Microbiology: Recent Results (from the past 240 hour(s))  SARS CORONAVIRUS 2 (TAT 6-24 HRS) Nasopharyngeal Nasopharyngeal Swab     Status: None   Collection Time: 07/06/20  5:01 PM   Specimen: Nasopharyngeal Swab  Result Value Ref Range Status   SARS Coronavirus 2 NEGATIVE NEGATIVE Final    Comment: (NOTE) SARS-CoV-2 target nucleic acids are NOT DETECTED.  The SARS-CoV-2 RNA is generally detectable in upper and lower respiratory specimens during the acute phase of infection. Negative results do not preclude SARS-CoV-2 infection, do not rule out co-infections with other pathogens, and should not be used as the sole basis for treatment or other patient management decisions. Negative results must be combined with clinical observations, patient history, and epidemiological information. The expected result is Negative.  Fact Sheet for Patients: SugarRoll.be  Fact Sheet for Healthcare Providers: https://www.woods-mathews.com/  This test  is not yet approved or cleared by the [place] FDA and  has been authorized for detection and/or diagnosis of SARS-CoV-2 by FDA under an Emergency Use Authorization (EUA). This EUA will remain  in effect (meaning this test can be used) for the duration of the COVID-19 declaration under Se ction 564(b)(1) of the Act, 21 U.S.C. section 360bbb-3(b)(1), unless the authorization is terminated or revoked sooner.  Performed at [facility], [place] [address] [place], [place] [address]     Radiology Studies: No results found.  Taye T. [place]  If 7PM-7AM, please contact night-coverage [email or web address] 07/09/2020, 4:36 PM

## 2020-07-09 NOTE — Telephone Encounter (Signed)
[name] called requesting to speak with Dr Burr Medico regarding [name]. Forwarded to dr Burr Medico

## 2020-07-09 NOTE — Care Management Important Message (Signed)
Medicare important message printed for Haley Mcmillan, NCM to give to the patient. 

## 2020-07-09 NOTE — Progress Notes (Addendum)
HEMATOLOGY-ONCOLOGY PROGRESS NOTE  SUBJECTIVE: The patient's daughter is at the bedside.  The patient reports generalized weakness and fatigue.  Has been having intermittent vomiting.  No bleeding reported.  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   04/09/2016 Initial Biopsy   Gastric mass biopsy showed gastrointestinal stromal tumor, I feel mitosis and necrosis, intermediate to high-grade.   04/09/2016 Imaging   CT chest, abdomen and pelvis with contrast showed a large exophytic mass in the proximal lateral stomach measuring 10 x 7.4 cm, nodular lesions within omentum and multiple liver nodules are highly suspicious for metastatic disease.   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 Today's study demonstrates a positive response to therapy with regression of the primary gastric neoplasm, as well as regression of numerous intraperitoneal metastatic lesions. Additionally, the liver lesions seen on the prior study appear far more well-defined, decreased in size and are much lower  attenuation, suggesting internal areas of necrosis. 2. Several small pulmonary nodules are noted, some of which are new compared to the prior examination. These are nonspecific, but metastatic disease to the lungs is not excluded. Attention on follow-up imaging is recommended. 3. Importantly, there is also widespread ground-glass attenuation and septal thickening in the lungs bilaterally which is new compared to the prior study. In the appropriate clinical setting, this could reflect a drug reaction. Other differential considerations include atypical infection, or even developing interstitial lung disease. Clinical correlation is suggested. 4. Aortic atherosclerosis, in addition to right coronary artery disease. 5. Mild diffuse bronchial wall thickening with mild centrilobular and paraseptal emphysema; imaging findings suggestive of underlying COPD. 6. Additional incidental findings, as above.   09/22/2016 Imaging    CT CAP W CONTRAST   IMPRESSION: 1. Nodular appearing GI stromal tumor along the posterior gastric wall with associated peritoneal implants and hepatic metastases, stable from 07/16/2016. 2. Improving mid and lower lung zone predominant peribronchovascular ground-glass, possibly related to drug therapy. 3. A few scattered pulmonary nodules are stable. 4. Aortic atherosclerosis (ICD10-170.0). Coronary artery calcification.   01/14/2017 Imaging   CT CAP w Contrast  IMPRESSION: 1. Further decrease in size of large GI stromal tumor arising from the greater curvature of the stomach with improvement in hepatic and multifocal peritoneal metastases. 2. Further improvement in lower lung predominant peribronchovascular ground-glass density, probably inflammatory (drug reaction or or infectious). 3. No acute findings. 4.  Aortic Atherosclerosis (ICD10-I70.0).   05/08/2017 Imaging   IMPRESSION: 1. Overall, today's examination is very similar to recent prior studies again  demonstrating a large proximal gastric mass with adjacent gastrosplenic ligament lymphadenopathy, multiple treated lesions in  the liver, and other intraperitoneal metastases, as discussed above. No definite new sites of metastatic disease are otherwise noted. 2. Aortic atherosclerosis, in addition to right coronary artery disease. 3. Mild cardiomegaly. 4. There are calcifications of the aortic valve. Echocardiographic correlation for evaluation of potential valvular dysfunction may be warranted if clinically indicated. 5. Additional incidental findings, as above. Aortic Atherosclerosis (ICD10-I70.0).   09/07/2017 Imaging   CT CAP W Contrast 09/07/17 IMPRESSION: 1. Large mass extending off the greater curvature of the stomach is similar to prior. Majority of nodularity within the adjacent mesentery is similar. There is one adjacent nodule which has increased in size when compared to prior exam. 2. Grossly unchanged treated hepatic lesions.   01/04/2018 Imaging   CT CAP WO COntrast 01/04/18 IMPRESSION: 1. No substantial interval change in exam. 2. Irregular mass at the proximal stomach is similar to prior. Scattered soft tissue lesions in the perigastric reason, omentum, and mesentery are similar to prior. No generalized trend towards worsening or improving disease. No new or progressive findings on today's study. 3. Stable appearance multiple hepatic lesions. 4.  Aortic Atherosclerois (ICD10-170.0)   05/05/2018 Imaging   CT CAP 05/05/18  IMPRESSION: 1. Mild response to therapy, as evidenced by decreased size of many soft tissue lesions along the greater curvature of the stomach, as well as within the omentum. 2. Similar hepatic metastasis. 3.  No acute process or evidence of metastatic disease in the chest. 4.  Aortic Atherosclerosis (ICD10-I70.0). 5. Pulmonary artery enlargement suggests pulmonary arterial hypertension.   09/28/2018 Imaging   CT CAP 09/28/18   IMPRESSION: 1. No change in soft tissue nodularity about the greater curvature of the stomach and pancreatic tail (series 2, image 53).  2. No change in noncontrast appearance of numerous small hypodense metastatic lesions of the liver.  3. New 4 mm irregular pulmonary nodule of the right pulmonary apex (series 6, image 32), nonspecific. Attention on follow-up. Additional stable small pulmonary nodules and ground-glass opacities, for example 3 mm adjacent nodules of the right upper lobe (series 6, image 51).   03/29/2019 Imaging   CT CAP WO Contrast  IMPRESSION: 1. Calcified nodular soft tissue seen along the posterior stomach and tail of pancreas is stable in the interval. No new or progressive interval findings. 2. Numerous low-density liver lesions compatible with metastatic disease. Overall appearance is stable. 3. 4 mm irregular right apical pulmonary nodule identified previously as new is unchanged in the interval. Continued attention on follow-up recommended. 4.  Aortic Atherosclerois (ICD10-170.0)   09/26/2019 Imaging   CT CAP w Contrast  IMPRESSION: Chest Impression:   1. No evidence of thoracic metastasis.   Abdomen / Pelvis Impression:   1. Stable serosal nodularity along the greater curvature of the stomach. 2. Stable small round low-density lesions in liver. No significant change from CT 07/16/2016. 3. No evidence disease progression the abdomen or pelvis.   06/12/2020 Imaging   CT AP  IMPRESSION: New large lobulated soft tissue mass in the left abdomen, consistent with recurrent malignancy or metastatic disease.   New moderate ascites. Diffuse mesenteric and body wall edema also noted. Stable nodular soft tissue densities in the gastrosplenic ligament along the greater curvature of the proximal gastric body, consistent with metastatic disease.   No significant change in multiple small low-attenuation liver lesions.   Small hiatal hernia.    Aortic Atherosclerosis (ICD10-I70.0).      REVIEW OF SYSTEMS:   Constitutional: Denies fevers, chills  Eyes: Denies blurriness of vision Ears,  nose, mouth, throat, and face: Denies mucositis or sore throat Respiratory: Denies cough, dyspnea or wheezes Cardiovascular: Denies palpitation, chest discomfort Gastrointestinal: Denies abdominal pain.  Has intermittent nausea and vomiting. Skin: Denies abnormal skin rashes Lymphatics: Denies new lymphadenopathy or easy bruising Neurological:Denies numbness, tingling or new weaknesses Behavioral/Psych: Mood is stable, no new changes  Extremities: No lower extremity edema All other systems were reviewed with the patient and are negative.  I have reviewed the past medical history, past surgical history, social history and family history with the patient and they are unchanged from previous note.   PHYSICAL EXAMINATION: ECOG PERFORMANCE STATUS: 3 - Symptomatic, >50% confined to bed  Vitals:   07/08/20 2031 07/09/20 0520  BP: (!) 91/49 (!) 110/55  Pulse: 94 91  Resp: 16 16  Temp: 100.3 F (37.9 C) 99.1 F (37.3 C)  SpO2: 96% 96%   Filed Weights   07/06/20 1600  Weight: 72.4 kg    Intake/Output from previous day: 01/23 0701 - 01/24 0700 In: 60 [P.O.:60] Out: 300 [Urine:300]  GENERAL: Chronically ill-appearing female, no distress SKIN: skin color, texture, turgor are normal, no rashes or significant lesions EYES: normal, Conjunctiva are pink and non-injected, sclera clear LUNGS: clear to auscultation and percussion with normal breathing effort HEART: regular rate & rhythm and no murmurs and no lower extremity edema ABDOMEN: Hypoactive bowel sounds, abdominal distention noted with ascites, nontender NEURO: alert & oriented x 3 with fluent speech, no focal motor/sensory deficits  LABORATORY DATA:  I have reviewed the data as listed CMP Latest Ref Rng & Units 07/09/2020 07/08/2020 07/07/2020  Glucose 70 - 99 mg/dL 136(H) 153(H)  97  BUN 8 - 23 mg/dL 96(H) 86(H) 83(H)  Creatinine 0.44 - 1.00 mg/dL 12.44(H) 12.88(H) 12.88(H)  Sodium 135 - 145 mmol/L 141 138 137  Potassium 3.5 - 5.1 mmol/L 3.8 4.3 4.7  Chloride 98 - 111 mmol/L 98 103 106  CO2 22 - 32 mmol/L 27 24 17(L)  Calcium 8.9 - 10.3 mg/dL 8.0(L) 8.3(L) 8.3(L)  Total Protein 6.5 - 8.1 g/dL - - -  Total Bilirubin 0.3 - 1.2 mg/dL - - -  Alkaline Phos 38 - 126 U/L - - -  AST 15 - 41 U/L - - -  ALT 0 - 44 U/L - - -    Lab Results  Component Value Date   WBC 7.2 07/08/2020   HGB 8.5 (L) 07/08/2020   HCT 26.1 (L) 07/08/2020   MCV 95.6 07/08/2020   PLT 187 07/08/2020   NEUTROABS 5.6 07/06/2020    CT ABDOMEN PELVIS WO CONTRAST  Result Date: 07/06/2020 CLINICAL DATA:  Acute renal failure.  Malignant GI stromal tumor. EXAM: CT ABDOMEN AND PELVIS WITHOUT CONTRAST TECHNIQUE: Multidetector CT imaging of the abdomen and pelvis was performed following the standard protocol without IV contrast. COMPARISON:  06/12/2020 FINDINGS: Lower chest: Small left and trace right pleural effusions, progressive. Associated lower lobe opacities, likely atelectasis, although superimposed mild right lower lobe infection/pneumonia is possible. Hepatobiliary: Scattered low-density liver lesions. A dominant 2.1 cm lesion in the posterior right hepatic lobe (series 2/image 19) is unchanged from most recent CT but progressive from 2020. A 13 mm lesion in the high right hepatic dome (series 2/image 13) is also new from 2020. This raises concern for metastatic disease. Gallbladder is grossly unremarkable. No intrahepatic or extrahepatic ductal dilatation. Pancreas: Within normal limits. Spleen: Within normal limits. Adrenals/Urinary Tract: Adrenal glands are within normal limits. Kidneys are within normal limits. No renal, ureteral, or  bladder calculi. No hydronephrosis. Bladder is underdistended but grossly unremarkable. Stomach/Bowel: Tiny hiatal hernia. Large mass (described below) abuts and may  arise from the undersurface of the gastric antrum. No evidence of bowel obstruction. Appendix is not discretely visualized. Vascular/Lymphatic: No evidence of abdominal aortic aneurysm. Atherosclerotic calcifications of the abdominal aorta and branch vessels. No suspicious abdominopelvic lymphadenopathy. Reproductive: Uterus is poorly visualized on the current CT. No adnexal masses. Other: 17.8 x 27.0 x 25.5 cm predominantly cystic mass in the central abdomen (series 2/image 50), corresponding to the patient's known GI stromal tumor. This is poorly evaluated on the current noncontrast CT but abuts the undersurface of the stomach. This previously measured 12.6 x 21.4 x 19.8 cm, reflecting significant interval growth. Small volume abdominopelvic ascites. Mild peritoneal disease is suspected beneath the anterior abdominal wall (for example, series 2/images 42 and 56), although this remains equivocal/poorly evaluated. Musculoskeletal: Mild degenerative changes of the lower thoracic spine. IMPRESSION: No renal, ureteral, or bladder calculi.  No hydronephrosis. 27.0 cm predominantly cystic mass in the central abdomen, corresponding to the patient's known GI stromal tumor, previously 21.4 cm. Suspected hepatic metastases. Suspected mild peritoneal disease, although poorly evaluated. Small volume abdominopelvic ascites. Small volume abdominopelvic ascites. Small left and trace right pleural effusions, progressive. Associated lower lobe opacities, likely atelectasis, although superimposed mild right lower lobe pneumonia is possible. Electronically Signed   By: Julian Hy M.D.   On: 07/06/2020 18:43   CT ABDOMEN PELVIS WO CONTRAST  Result Date: 06/12/2020 CLINICAL DATA:  Follow-up malignant GI stromal tumor. Evaluate treatment response. EXAM: CT ABDOMEN AND PELVIS WITHOUT CONTRAST TECHNIQUE: Multidetector CT imaging of the abdomen and pelvis was performed following the standard protocol without IV contrast.  COMPARISON:  03/29/2019 FINDINGS: Lower chest: New small left pleural effusion and left lower lobe atelectasis. Hepatobiliary: Multiple small low-attenuation liver lesions are again seen, without significant change. Gallbladder is unremarkable. No evidence of biliary ductal dilatation. Pancreas: No mass or inflammatory process visualized on this unenhanced exam. Spleen:  Within normal limits in size. Adrenals/Urinary tract: No evidence of urolithiasis or hydronephrosis. Unremarkable unopacified urinary bladder. Stomach/Bowel: Small hiatal hernia noted. No evidence of obstruction, inflammatory process, or abnormal fluid collections. Vascular/Lymphatic: No pathologically enlarged lymph nodes identified. No evidence of abdominal aortic aneurysm. Aortic atherosclerotic calcification noted. Reproductive:  No mass or other significant abnormality. Other: Moderate ascites is new since previous study. A new large lobulated soft tissue mass is seen in the left abdomen which measures 21.4 x 12.1 cm on image 49/2. Nodular soft tissue densities with punctate calcifications are again seen in the gastrosplenic ligament along the greater curvature of the proximal gastric body, without significant change. Diffuse mesenteric and body wall edema is new since prior exam. Musculoskeletal:  No suspicious bone lesions identified. IMPRESSION: New large lobulated soft tissue mass in the left abdomen, consistent with recurrent malignancy or metastatic disease. New moderate ascites. Diffuse mesenteric and body wall edema also noted. Stable nodular soft tissue densities in the gastrosplenic ligament along the greater curvature of the proximal gastric body, consistent with metastatic disease. No significant change in multiple small low-attenuation liver lesions. Small hiatal hernia. Aortic Atherosclerosis (ICD10-I70.0). Electronically Signed   By: Marlaine Hind M.D.   On: 06/12/2020 16:08   DG Chest 2 View  Result Date: 07/08/2020 CLINICAL  DATA:  87 year old female with hypoxia. Stage IV gastrointestinal stromal tumor. EXAM: CHEST - 2 VIEW COMPARISON:  Chest radiograph dated 08/25/2016. FINDINGS: Shallow inspiration. Small to moderate bilateral pleural effusions with bibasilar  compressive atelectasis. Pneumonia is not excluded clinical correlation is recommended. No pneumothorax. Mild cardiomegaly. Atherosclerotic calcification of the aorta. No acute osseous pathology. IMPRESSION: Bilateral pleural effusions with bibasilar atelectasis versus infiltrate. Electronically Signed   By: Anner Crete M.D.   On: 07/08/2020 15:21    ASSESSMENT AND PLAN: 1.  GIST with liver and peritoneal metastasis 2.  Anemia 3.  AKI 4.  Goals of care, DNR  5. Anorexia and deconditioning   -Reviewed the CT scan images with the patient and her daughter.  Unfortunately, she has disease progression.  She is also more symptomatic related to her cancer.  Gleevec has now been discontinued due to disease progression. -Discussed consideration of hospice services.  Services were explained to the patient and her daughter in detail.  They will think about this. -Hemoglobin was up to 8.5 on 1/23.  No bleeding reported.  Monitor and transfuse as needed if consistently goals of care. -Renal function has not really improved.  Etiology of AKI unclear.  Continue IV fluids per hospitalist.   LOS: 3 days   Mikey Bussing, DNP, AGPCNP-BC, AOCNP 07/09/20   Addendum   I have seen the patient, examined her. I agree with the assessment and and plan and have edited the notes.   I checked her UA today which came back very high at 14.7, she has tumor lysis syndrome, which probably caused her AKI. will give one dose rasburicase $RemoveBeforeDE'6mg'huRrdzGrurVJyCZ$  today, I spoke with pharmacy today. Continue IVF for TLS.  Please also consider renal consult. Due to her poor prognosis from GIST, I do not recommend HD and she is agreeable on that.   I personally reviewed her CT scan from 1/21 which showed  worsening abdominal mass and hepatic metastasis. Given her current AKI, poor PS, I doubt she is a candidate for more treatment of GIST. We discussed hospice. Pt appears to be in shock and does not talk much to me today. Her daughter is at bedside, stated that he has talked to her husband who [facility] from [place]. Her husband and daughter are agreeable with home hospice care.   I will add megace for her poor appetite.   Let's watch how her renal function and overall go in next few days, then plan on her discharge. I will f/u tomorrow.  Truitt Merle  07/09/2020

## 2020-07-10 DIAGNOSIS — Z515 Encounter for palliative care: Secondary | ICD-10-CM

## 2020-07-10 DIAGNOSIS — C49A0 Gastrointestinal stromal tumor, unspecified site: Secondary | ICD-10-CM

## 2020-07-10 LAB — RENAL FUNCTION PANEL
Albumin: 2.6 g/dL — ABNORMAL LOW (ref 3.5–5.0)
Anion gap: 16 — ABNORMAL HIGH (ref 5–15)
BUN: 98 mg/dL — ABNORMAL HIGH (ref 8–23)
CO2: 34 mmol/L — ABNORMAL HIGH (ref 22–32)
Calcium: 7.9 mg/dL — ABNORMAL LOW (ref 8.9–10.3)
Chloride: 92 mmol/L — ABNORMAL LOW (ref 98–111)
Creatinine, Ser: 11.64 mg/dL — ABNORMAL HIGH (ref 0.44–1.00)
GFR, Estimated: 3 mL/min — ABNORMAL LOW (ref >60)
Glucose, Bld: 127 mg/dL — ABNORMAL HIGH (ref 70–99)
Phosphorus: 4.9 mg/dL — ABNORMAL HIGH (ref 2.5–4.6)
Potassium: 3.4 mmol/L — ABNORMAL LOW (ref 3.5–5.1)
Sodium: 142 mmol/L (ref 135–145)

## 2020-07-10 LAB — CBC
HCT: 18 % — ABNORMAL LOW (ref 36.0–46.0)
Hemoglobin: 6 g/dL — CL (ref 12.0–15.0)
MCH: 32.3 pg (ref 26.0–34.0)
MCHC: 33.3 g/dL (ref 30.0–36.0)
MCV: 96.8 fL (ref 80.0–100.0)
Platelets: 143 10*3/uL — ABNORMAL LOW (ref 150–400)
RBC: 1.86 MIL/uL — ABNORMAL LOW (ref 3.87–5.11)
RDW: 22.1 % — ABNORMAL HIGH (ref 11.5–15.5)
WBC: 5.9 10*3/uL (ref 4.0–10.5)
nRBC: 0 % (ref 0.0–0.2)

## 2020-07-10 LAB — PREPARE RBC (CROSSMATCH)

## 2020-07-10 LAB — LACTATE DEHYDROGENASE: LDH: 456 U/L — ABNORMAL HIGH (ref 98–192)

## 2020-07-10 LAB — URIC ACID: Uric Acid, Serum: <0.5 mg/dL — ABNORMAL LOW (ref 2.5–7.1)

## 2020-07-10 LAB — MAGNESIUM: Magnesium: 1.8 mg/dL (ref 1.7–2.4)

## 2020-07-10 MED ORDER — SODIUM CHLORIDE 0.9% IV SOLUTION: Freq: Once | INTRAVENOUS | Status: AC

## 2020-07-10 MED ORDER — SODIUM CHLORIDE 0.9 % IV SOLN: INTRAVENOUS | Status: DC

## 2020-07-10 NOTE — Progress Notes (Signed)
HEMATOLOGY-ONCOLOGY PROGRESS NOTE  SUBJECTIVE: The patient was seen in her hospital room.  No family at the bedside.  She is currently sitting up in recliner and is uncomfortable due to positioning.  She denies having any nausea or vomiting this morning.  Has not moved her bowels in several days.  No bleeding reported.  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   04/09/2016 Initial Biopsy   Gastric mass biopsy showed gastrointestinal stromal tumor, I feel mitosis and necrosis, intermediate to high-grade.   04/09/2016 Imaging   CT chest, abdomen and pelvis with contrast showed a large exophytic mass in the proximal lateral stomach measuring 10 x 7.4 cm, nodular lesions within omentum and multiple liver nodules are highly suspicious for metastatic disease.   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 Today's study demonstrates a positive response to therapy with regression of the primary gastric neoplasm, as well as regression of numerous intraperitoneal metastatic lesions. Additionally, the  liver lesions seen on the prior study appear far more well-defined, decreased in size and are much lower attenuation, suggesting internal areas of necrosis. 2. Several small pulmonary nodules are noted, some of which are new compared to the prior examination. These are nonspecific, but metastatic disease to the lungs is not excluded. Attention on follow-up imaging is recommended. 3. Importantly, there is also widespread ground-glass attenuation and septal thickening in the lungs bilaterally which is new compared to the prior study. In the appropriate clinical setting, this could reflect a drug reaction. Other differential considerations include atypical infection, or even developing interstitial lung disease. Clinical correlation is suggested. 4. Aortic atherosclerosis, in addition to right coronary artery disease. 5. Mild diffuse bronchial wall thickening with mild centrilobular and paraseptal emphysema; imaging findings suggestive of underlying COPD. 6. Additional incidental findings, as above.   09/22/2016 Imaging    CT CAP W CONTRAST   IMPRESSION: 1. Nodular appearing GI stromal tumor along the posterior gastric wall with associated peritoneal implants and hepatic metastases, stable from 07/16/2016. 2. Improving mid and lower lung zone predominant peribronchovascular ground-glass, possibly related to drug therapy. 3. A few scattered pulmonary nodules are stable. 4. Aortic atherosclerosis (ICD10-170.0). Coronary artery calcification.   01/14/2017 Imaging   CT CAP w Contrast  IMPRESSION: 1. Further decrease in size of large GI stromal tumor arising from the greater curvature of the stomach with improvement in hepatic and multifocal peritoneal metastases. 2. Further improvement in lower lung predominant peribronchovascular ground-glass density, probably inflammatory (drug reaction or or infectious). 3. No acute findings. 4.  Aortic Atherosclerosis (ICD10-I70.0).    05/08/2017 Imaging   IMPRESSION: 1.  Overall, today's examination is very similar to recent prior studies again demonstrating a large proximal gastric mass with adjacent gastrosplenic ligament lymphadenopathy, multiple treated lesions in the liver, and other intraperitoneal metastases, as discussed above. No definite new sites of metastatic disease are otherwise noted. 2. Aortic atherosclerosis, in addition to right coronary artery disease. 3. Mild cardiomegaly. 4. There are calcifications of the aortic valve. Echocardiographic correlation for evaluation of potential valvular dysfunction may be warranted if clinically indicated. 5. Additional incidental findings, as above. Aortic Atherosclerosis (ICD10-I70.0).   09/07/2017 Imaging   CT CAP W Contrast 09/07/17 IMPRESSION: 1. Large mass extending off the greater curvature of the stomach is similar to prior. Majority of nodularity within the adjacent mesentery is similar. There is one adjacent nodule which has increased in size when compared to prior exam. 2. Grossly unchanged treated hepatic lesions.   01/04/2018 Imaging   CT CAP WO COntrast 01/04/18 IMPRESSION: 1. No substantial interval change in exam. 2. Irregular mass at the proximal stomach is similar to prior. Scattered soft tissue lesions in the perigastric reason, omentum, and mesentery are similar to prior. No generalized trend towards worsening or improving disease. No new or progressive findings on today's study. 3. Stable appearance multiple hepatic lesions. 4.  Aortic Atherosclerois (ICD10-170.0)   05/05/2018 Imaging   CT CAP 05/05/18  IMPRESSION: 1. Mild response to therapy, as evidenced by decreased size of many soft tissue lesions along the greater curvature of the stomach, as well as within the omentum. 2. Similar hepatic metastasis. 3.  No acute process or evidence of metastatic disease in the chest. 4.  Aortic Atherosclerosis (ICD10-I70.0). 5. Pulmonary  artery enlargement suggests pulmonary arterial hypertension.   09/28/2018 Imaging   CT CAP 09/28/18  IMPRESSION: 1. No change in soft tissue nodularity about the greater curvature of the stomach and pancreatic tail (series 2, image 53).  2. No change in noncontrast appearance of numerous small hypodense metastatic lesions of the liver.  3. New 4 mm irregular pulmonary nodule of the right pulmonary apex (series 6, image 32), nonspecific. Attention on follow-up. Additional stable small pulmonary nodules and ground-glass opacities, for example 3 mm adjacent nodules of the right upper lobe (series 6, image 51).   03/29/2019 Imaging   CT CAP WO Contrast  IMPRESSION: 1. Calcified nodular soft tissue seen along the posterior stomach and tail of pancreas is stable in the interval. No new or progressive interval findings. 2. Numerous low-density liver lesions compatible with metastatic disease. Overall appearance is stable. 3. 4 mm irregular right apical pulmonary nodule identified previously as new is unchanged in the interval. Continued attention on follow-up recommended. 4.  Aortic Atherosclerois (ICD10-170.0)   09/26/2019 Imaging   CT CAP w Contrast  IMPRESSION: Chest Impression:   1. No evidence of thoracic metastasis.   Abdomen / Pelvis Impression:   1. Stable serosal nodularity along the greater curvature of the stomach. 2. Stable small round low-density lesions in liver. No significant change from CT 07/16/2016. 3. No evidence disease progression the abdomen or pelvis.   06/12/2020 Imaging   CT AP  IMPRESSION: New large lobulated soft tissue mass in the left abdomen, consistent with recurrent malignancy or metastatic disease.   New moderate ascites. Diffuse mesenteric and body wall edema also noted. Stable nodular soft tissue densities in the gastrosplenic ligament along the greater curvature of the proximal gastric body, consistent with metastatic disease.   No  significant change in multiple small low-attenuation liver lesions.   Small hiatal hernia.  Aortic Atherosclerosis (ICD10-I70.0).      REVIEW OF SYSTEMS:   Constitutional: Denies fevers, chills  Eyes: Denies blurriness of vision Ears, nose, mouth, throat, and face: Denies mucositis or sore throat Respiratory: Denies cough, dyspnea or wheezes Cardiovascular: Denies palpitation, chest discomfort Gastrointestinal: Has abdominal pain, nausea, vomiting this morning Skin: Denies abnormal skin rashes Lymphatics: Denies new lymphadenopathy or easy bruising Neurological:Denies numbness, tingling or new weaknesses Behavioral/Psych: Mood is stable, no new changes  Extremities: No lower extremity edema All other systems were reviewed with the patient and are negative.  I have reviewed the past medical history, past surgical history, social history and family history with the patient and they are unchanged from previous note.   PHYSICAL EXAMINATION: ECOG PERFORMANCE STATUS: 3 - Symptomatic, >50% confined to bed  Vitals:   07/09/20 2042 07/10/20 0525  BP: (!) 105/48 (!) 103/53  Pulse: 82 83  Resp: 16 17  Temp: 98.2 F (36.8 C) 99.6 F (37.6 C)  SpO2: 97% 97%   Filed Weights   07/06/20 1600  Weight: 72.4 kg    Intake/Output from previous day: 01/24 0701 - 01/25 0700 In: 1375 [I.V.:1375] Out: 700 [Urine:700]  GENERAL: Chronically ill-appearing female, no distress SKIN: skin color, texture, turgor are normal, no rashes or significant lesions EYES: normal, Conjunctiva are pink and non-injected, sclera clear LUNGS: clear to auscultation and percussion with normal breathing effort HEART: regular rate & rhythm and no murmurs and no lower extremity edema ABDOMEN: Hypoactive bowel sounds, abdominal distention noted with ascites, nontender NEURO: alert & oriented x 3 with fluent speech, no focal motor/sensory deficits  LABORATORY DATA:  I have reviewed the data as listed CMP  Latest Ref Rng & Units 07/10/2020 07/09/2020 07/08/2020  Glucose 70 - 99 mg/dL 127(H) 136(H) 153(H)  BUN 8 - 23 mg/dL 98(H) 96(H) 86(H)  Creatinine 0.44 - 1.00 mg/dL 11.64(H) 12.44(H) 12.88(H)  Sodium 135 - 145 mmol/L 142 141 138  Potassium 3.5 - 5.1 mmol/L 3.4(L) 3.8 4.3  Chloride 98 - 111 mmol/L 92(L) 98 103  CO2 22 - 32 mmol/L 34(H) 27 24  Calcium 8.9 - 10.3 mg/dL 7.9(L) 8.0(L) 8.3(L)  Total Protein 6.5 - 8.1 g/dL - - -  Total Bilirubin 0.3 - 1.2 mg/dL - - -  Alkaline Phos 38 - 126 U/L - - -  AST 15 - 41 U/L - - -  ALT 0 - 44 U/L - - -    Lab Results  Component Value Date   WBC 5.9 07/10/2020   HGB 6.0 (LL) 07/10/2020   HCT 18.0 (L) 07/10/2020   MCV 96.8 07/10/2020   PLT 143 (L) 07/10/2020   NEUTROABS 5.6 07/06/2020    CT ABDOMEN PELVIS WO CONTRAST  Result Date: 07/06/2020 CLINICAL DATA:  Acute renal failure.  Malignant GI stromal tumor. EXAM: CT ABDOMEN AND PELVIS WITHOUT CONTRAST TECHNIQUE: Multidetector CT imaging of the abdomen and pelvis was performed following the standard protocol without IV contrast. COMPARISON:  06/12/2020 FINDINGS: Lower chest: Small left and trace right pleural effusions, progressive. Associated lower lobe opacities, likely atelectasis, although superimposed mild right lower lobe infection/pneumonia is possible. Hepatobiliary: Scattered low-density liver lesions. A dominant 2.1 cm lesion in the posterior right hepatic lobe (series 2/image 19) is unchanged from most recent CT but progressive from 2020. A 13 mm lesion in the high right hepatic dome (series 2/image 13) is also new from 2020. This raises concern for metastatic disease. Gallbladder is grossly unremarkable. No intrahepatic or extrahepatic ductal dilatation. Pancreas: Within  normal limits. Spleen: Within normal limits. Adrenals/Urinary Tract: Adrenal glands are within normal limits. Kidneys are within normal limits. No renal, ureteral, or bladder calculi. No hydronephrosis. Bladder is underdistended  but grossly unremarkable. Stomach/Bowel: Tiny hiatal hernia. Large mass (described below) abuts and may arise from the undersurface of the gastric antrum. No evidence of bowel obstruction. Appendix is not discretely visualized. Vascular/Lymphatic: No evidence of abdominal aortic aneurysm. Atherosclerotic calcifications of the abdominal aorta and branch vessels. No suspicious abdominopelvic lymphadenopathy. Reproductive: Uterus is poorly visualized on the current CT. No adnexal masses. Other: 17.8 x 27.0 x 25.5 cm predominantly cystic mass in the central abdomen (series 2/image 50), corresponding to the patient's known GI stromal tumor. This is poorly evaluated on the current noncontrast CT but abuts the undersurface of the stomach. This previously measured 12.6 x 21.4 x 19.8 cm, reflecting significant interval growth. Small volume abdominopelvic ascites. Mild peritoneal disease is suspected beneath the anterior abdominal wall (for example, series 2/images 42 and 56), although this remains equivocal/poorly evaluated. Musculoskeletal: Mild degenerative changes of the lower thoracic spine. IMPRESSION: No renal, ureteral, or bladder calculi.  No hydronephrosis. 27.0 cm predominantly cystic mass in the central abdomen, corresponding to the patient's known GI stromal tumor, previously 21.4 cm. Suspected hepatic metastases. Suspected mild peritoneal disease, although poorly evaluated. Small volume abdominopelvic ascites. Small volume abdominopelvic ascites. Small left and trace right pleural effusions, progressive. Associated lower lobe opacities, likely atelectasis, although superimposed mild right lower lobe pneumonia is possible. Electronically Signed   By: [facility] Hy M.D.   On: 07/06/2020 18:43   CT ABDOMEN PELVIS WO CONTRAST  Result Date: 06/12/2020 CLINICAL DATA:  Follow-up malignant GI stromal tumor. Evaluate treatment response. EXAM: CT ABDOMEN AND PELVIS WITHOUT CONTRAST TECHNIQUE: Multidetector CT  imaging of the abdomen and pelvis was performed following the standard protocol without IV contrast. COMPARISON:  03/29/2019 FINDINGS: Lower chest: New small left pleural effusion and left lower lobe atelectasis. Hepatobiliary: Multiple small low-attenuation liver lesions are again seen, without significant change. Gallbladder is unremarkable. No evidence of biliary ductal dilatation. Pancreas: No mass or inflammatory process visualized on this unenhanced exam. Spleen:  Within normal limits in size. Adrenals/Urinary tract: No evidence of urolithiasis or hydronephrosis. Unremarkable unopacified urinary bladder. Stomach/Bowel: Small hiatal hernia noted. No evidence of obstruction, inflammatory process, or abnormal fluid collections. Vascular/Lymphatic: No pathologically enlarged lymph nodes identified. No evidence of abdominal aortic aneurysm. Aortic atherosclerotic calcification noted. Reproductive:  No mass or other significant abnormality. Other: Moderate ascites is new since previous study. A new large lobulated soft tissue mass is seen in the left abdomen which measures 21.4 x 12.1 cm on image 49/2. Nodular soft tissue densities with punctate calcifications are again seen in the gastrosplenic ligament along the greater curvature of the proximal gastric body, without significant change. Diffuse mesenteric and body wall edema is new since prior exam. Musculoskeletal:  No suspicious bone lesions identified. IMPRESSION: New large lobulated soft tissue mass in the left abdomen, consistent with recurrent malignancy or metastatic disease. New moderate ascites. Diffuse mesenteric and body wall edema also noted. Stable nodular soft tissue densities in the gastrosplenic ligament along the greater curvature of the proximal gastric body, consistent with metastatic disease. No significant change in multiple small low-attenuation liver lesions. Small hiatal hernia. Aortic Atherosclerosis (ICD10-I70.0). Electronically Signed    By: Marlaine Hind M.D.   On: 06/12/2020 16:08   DG Chest 2 View  Result Date: 07/08/2020 CLINICAL DATA:  87 year old female with hypoxia. Stage IV gastrointestinal stromal  tumor. EXAM: CHEST - 2 VIEW COMPARISON:  Chest radiograph dated 08/25/2016. FINDINGS: Shallow inspiration. Small to moderate bilateral pleural effusions with bibasilar compressive atelectasis. Pneumonia is not excluded clinical correlation is recommended. No pneumothorax. Mild cardiomegaly. Atherosclerotic calcification of the aorta. No acute osseous pathology. IMPRESSION: Bilateral pleural effusions with bibasilar atelectasis versus infiltrate. Electronically Signed   By: Anner Crete M.D.   On: 07/08/2020 15:21    ASSESSMENT AND PLAN: 1.  GIST with liver and peritoneal metastasis 2.  Anemia 3.  AKI 4.  Goals of care, DNR  5. Anorexia and deconditioning   -CT scans from this admission showed disease progression.  Gleevec has been discontinued.  Due to her AKI and poor performance status, she is not a candidate for more treatment for GIST.  We have discussed hospice with the patient and her husband and daughter.  The patient does not seem to be willing to discuss this but her husband and daughter are in agreement to proceeding with hospice.  Her husband has reached out to the [place] to arrange for home health/hospice services.  TOC consult was placed by me this morning to help with hospice arrangements. -Hemoglobin down to 6.0 today.  Will give 2 units PRBCs. -Renal function slightly improved but no significant change.  The patient has been seen by nephrology suspect ATN due to TLS.  She is not a candidate for dialysis due to comorbid conditions.  Continue IV fluids and monitor renal function. -Uric acid level is down.  Status post rasburicase 1/24.  Monitor.   LOS: 4 days   Mikey Bussing, DNP, AGPCNP-BC, AOCNP 07/10/20

## 2020-07-10 NOTE — Consult Note (Addendum)
Renal Service Consult Note [facility]  Haley Mcmillan 07/10/2020 Sol Blazing, MD Requesting Physician: Dr Cyndia Skeeters  Reason for Consult: Renal failure HPI: The patient is a 87 y.o. year-old w/ hx of GIST malignancy stage IV, CKD 3a, HTN and HL was admiited from the [facility] office on 1/21 for AKI and anemia, Hb had dropped to 5.0 and Creat up to 13. Hx was provided in the chart the pt was not eating well for 10d w/ N/V and some loose stool. Pt was admitted and given IVF"s for AKI on CKD 3a. CT showed large L upper abd mass 21 cm.  ACEi was held. Transfused 2u prbc's. BP's were soft. Uric acid was high at 14.7 suggesting TLS, given rasburicase 6 mg by ONC on 1/24. Despite these measures creat remains high at 11.6 today.  BUN 98. Asked to see for renal failure.   Pt w/o c/o's today.   ROS- no CP, abd pain, dysuria, change in urine color  Past Medical History  Past Medical History:  Diagnosis Date  . Anemia   . Arthritis   . Colon polyps   . GIST DX'D  03/2016  . Olecranon bursitis    Elbow  . Other and unspecified hyperlipidemia   . Palpitations   . Unspecified essential hypertension    Past Surgical History  Past Surgical History:  Procedure Laterality Date  . COLONOSCOPY W/ POLYPECTOMY  01/2004  . CRYOTHERAPY     female-cryosurgery stated by pt  . ESOPHAGOGASTRODUODENOSCOPY N/A 04/09/2016   Procedure: ESOPHAGOGASTRODUODENOSCOPY (EGD);  Surgeon: Doran Stabler, MD;  Location: [facility] ENDOSCOPY;  Service: Endoscopy;  Laterality: N/A;  . POLYPECTOMY     from vocal surgery   Family History  Family History  Problem Relation Age of Onset  . Gallstones Mother   . Aneurysm Father        brain  . Heart disease Father        CAD  . Breast cancer Sister   . Breast cancer Sister   . Heart attack Brother   . Cancer Brother   . Lung cancer Son   . Diabetes Neg Hx    Social History  reports that she quit smoking about 25 years ago. Her smoking use included  cigarettes. She has a 25.00 pack-year smoking history. She has never used smokeless tobacco. She reports previous alcohol use. She reports that she does not use drugs. Allergies No Known Allergies Home medications Prior to Admission medications   Medication Sig Start Date End Date Taking? Authorizing Provider  amLODipine (NORVASC) 5 MG tablet Take 1 Tablet by mouth daily 04/30/20  Yes Hoyt Koch, MD  imatinib (GLEEVEC) 400 MG tablet Take 1 tablet (400 mg total) by mouth 2 (two) times daily. Take with meals and large glass of water.Caution:Chemotherapy. 06/19/20  Yes Truitt Merle, MD  lisinopril (ZESTRIL) 20 MG tablet TAKE 1 TABLET(20 MG) BY MOUTH DAILY 04/30/20  Yes Hoyt Koch, MD  ondansetron (ZOFRAN) 8 MG tablet Take 1 tablet (8 mg total) by mouth every 8 (eight) hours as needed for nausea or vomiting. 07/04/20  Yes Truitt Merle, MD  pantoprazole (PROTONIX) 40 MG tablet Take 1 tablet (40 mg total) by mouth daily. 05/09/20  Yes Hoyt Koch, MD  Blood Pressure Monitoring (BLOOD PRESSURE CUFF) MISC Measure BP daily  Dx: hypertension 07/09/18   Binnie Rail, MD     Vitals:   07/09/20 8341 07/09/20 1345 07/09/20 2042 07/10/20 0525  BP: Marland Kitchen)  110/55 (!) 98/54 (!) 105/48 (!) 103/53  Pulse: 91 82 82 83  Resp: $Remo'16 16 16 17  'jMpAm$ Temp: 99.1 F (37.3 C) 97.8 F (36.6 C) 98.2 F (36.8 C) 99.6 F (37.6 C)  TempSrc: Oral Oral Oral Oral  SpO2: 96% 100% 97% 97%  Weight:      Height:       Exam GENERAL: chronically ill-appearing. HEENT: MMM.  Vision and hearing grossly intact.  NECK: Supple.  No apparent JVD.  RESP:  No IWOB.  Fair aeration bilaterally. CVS:  RRR. Heart sounds normal.  ABD/GI/GU: BS+.  Firm and distended abdomen.  Dull to percussion.  Nontender. MSK/EXT:  Moves extremities. No apparent deformity. No edema.  SKIN: no apparent skin lesion or wound NEURO: Awake, alert and oriented appropriately.  No apparent focal neuro deficit. PSYCH: Flat affect.     Home  meds:  - norvasc qd/ lisinopril 20 qd  - protonix 40/ prn's  - imatinib 400 bid    UA >50 wbc, 6-10 rbc, 11-20 epi, few bact, prot 30, cloudy    CT abd 1/21 - IMPRESSION: Kidneys are within normal limits. No renal, ureteral, or bladder calculi. No hydronephrosis.     CXR - IMPRESSION: Bilateral pleural effusions with bibasilar atelectasis versus     B/L creat 1.22- 1.75 in 2021, eGFR 28-47     Creat here 13 > 12 > 12.8 > 12.4 > 11.64 today     I/O 5.2 L in and 1.0 L UOP = +4.2 L net     BP's soft 90- 110 SBP's     UA 14 > < 0.5 after rasburicase infiltrate.  Assessment/ Plan: 1. AKI on CKD 3b - b/l creat 1.22- 1.75 in 2021, eGFR 28-47 ml/min. Creat 13 on admit 1/21 , 12.4 yest and 11.6 today. UOP okay 500 cc yest and today, nonoliguric. Suspect ATN due to TLS in pt w/ known stage IV cancer. Has been rx'd w/ rasburicase. Pt is euvolemic, would cont IVF's, switch to NS at 75 cc/hr. Not a candidate for dialysis d/t comorbidities. Call w/ any questions.  2. GIST - w/ liver and peritoneal mets, per ONC 3. Anemia 4. DNR 5. FTT/ anorexia/ deconditioning 6. HTN - bp's soft , home meds on  hold      Rob Jonnie Finner  MD 07/10/2020, 8:45 AM  Recent Labs  Lab 07/08/20 0613 07/10/20 0644  WBC 7.2 5.9  HGB 8.5* 6.0*   Recent Labs  Lab 07/09/20 0621 07/10/20 0644  K 3.8 3.4*  BUN 96* 98*  CREATININE 12.44* 11.64*  CALCIUM 8.0* 7.9*  PHOS 5.3* 4.9*

## 2020-07-10 NOTE — TOC Initial Note (Signed)
Transition of Care [facility]) - Initial/Assessment Note    Patient Details  Name: Haley Mcmillan MRN: [id number] Date of Birth: 1934/03/21  Transition of Care [facility]) CM/SW Contact:    Lynnell Catalan, RN Phone Number: 07/10/2020, 3:12 PM  Clinical Narrative:                 [facility] consult for home hospice set up. Spoke with pt and daughter at bedside for dc planning. Pt lives with spouse and daughter to help as well. Choice offered for home hospice services. Daughter wants to talk to pt spouse and she will call this CM with choice. Daughter requests [facility] for home. Pt already has RW and hospital bed at home. Pt is currently on 02 in the hospital. Will need to see if home 02 is needed at dc.  Expected Discharge Plan: Home w Hospice Care Barriers to Discharge: Continued Medical Work up      Expected Discharge Plan and Services Expected Discharge Plan: [place]     Activities of Daily Living Home Assistive Devices/Equipment: Eyeglasses,Shower chair with back ADL Screening (condition at time of admission) Patient's cognitive ability adequate to safely complete daily activities?: No Is the patient deaf or have difficulty hearing?: Yes Does the patient have difficulty seeing, even when wearing glasses/contacts?: No Does the patient have difficulty concentrating, remembering, or making decisions?: Yes (confusion is new today) Patient able to express need for assistance with ADLs?: Yes Does the patient have difficulty dressing or bathing?: Yes Independently performs ADLs?: No Communication: Independent Dressing (OT): Needs assistance Is this a change from baseline?: Pre-admission baseline Grooming: Independent Feeding: Independent Bathing: Needs assistance Is this a change from baseline?: Pre-admission baseline Toileting: Needs assistance Is this a change from baseline?: Pre-admission baseline In/Out Bed: Needs assistance Is this a change from baseline?: Pre-admission  baseline Walks in Home: Needs assistance Is this a change from baseline?: Pre-admission baseline Does the patient have difficulty walking or climbing stairs?: Yes Weakness of Legs: Both Weakness of Arms/Hands: Both  Permission Sought/Granted                  Emotional Assessment              Admission diagnosis:  AKI (acute kidney injury) ([place]) [N17.9] Patient Active Problem List   Diagnosis Date Noted  . AKI (acute kidney injury) ([place]) 07/06/2020  . Bloating 05/09/2020  . Alopecia 08/12/2017  . Atherosclerosis of aorta ([place]) 05/12/2017  . Iron deficiency anemia due to chronic blood loss 05/12/2016  . Numbness of foot 05/05/2016  . Malignant gastrointestinal stromal tumor (GIST) of stomach ([place]) 04/20/2016  . Liver nodule 04/08/2016  . Loss of weight 03/04/2016  . Routine health maintenance 12/25/2012  . Hyperlipidemia 06/21/2007  . Essential hypertension 06/21/2007  . Palpitations 06/21/2007   PCP:  Hoyt Koch, MD Pharmacy:   [facility] [place], [place] - [address] AT [place] [address] Phone: [phone] Fax: [phone]  [place], [place] - [address] [place] [address] Phone: [phone] Fax: [phone]     Social Determinants of Health (SDOH) Interventions    Readmission Risk Interventions No flowsheet data found.

## 2020-07-10 NOTE — Progress Notes (Signed)
PROGRESS NOTE  Haley Mcmillan HUD:[id number] DOB: 21-Apr-1934   PCP: Hoyt Koch, MD  Patient is from: Home.  Lives with husband.  Independent  DOA: 07/06/2020 LOS: 4  Chief complaints: AKI and anemia  Brief Narrative / Interim history: 87 y.o. 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 Burr Medico, CKD-3A, IDA, HTN, HLD and diminished hearing admitted from oncology office with AKI and anemia. Cr 13.23 from 1.75 three weeks prior.  BUN 80.  Hgb 5.0 from 9.2 three weeks prior. CT abdomen and pelvis showed 27.0 cm cystic mass in central abdomen corresponding to patient's known GIST which was 21.4 cm 3 weeks prior.  Uric acid 14.7 raising concern for tumor lysis syndrome.  Received a dose of rasburicase 6 mg.  Oncology does not recommend HD given a poor prognosis from GIST.  Nephrology recommended IV fluid.  Eventually, plan is to go home with home hospice.  TOC consulted.  Subjective: Seen and examined earlier this morning.  No major events overnight or this morning.  Reports vague pain/discomfort across upper abdomen and lower back.  Denies nausea or vomiting.  Minimal urine output.  Currently saturating at 97% on 6 L by [place].  Hemoglobin dropped to 6.0 again.  Minimal improvement in creatinine.  Objective: Vitals:   07/09/20 2042 07/10/20 0525 07/10/20 1533 07/10/20 1548  BP: (!) 105/48 (!) 103/53 96/60 (!) 101/56  Pulse: 82 83 79 79  Resp: 16 17 18 18   Temp: 98.2 F (36.8 C) 99.6 F (37.6 C) 98.6 F (37 C) 99.8 F (37.7 C)  TempSrc: Oral Oral Oral Oral  SpO2: 97% 97% 100% 100%  Weight:      Height:        Intake/Output Summary (Last 24 hours) at 07/10/2020 1621 Last data filed at 07/10/2020 1533 Gross per 24 hour  Intake 1775 ml  Output 500 ml  Net 1275 ml   Filed Weights   07/06/20 1600  Weight: 72.4 kg    Examination: GENERAL: No apparent distress.  Nontoxic. HEENT: MMM.  Vision and hearing grossly intact.  NECK:  Supple.  No apparent JVD.  RESP: 97% on 6 L.  No IWOB.  Diminished aeration CVS:  RRR. Heart sounds normal.  ABD/GI/GU: BS+.  Firm and distended abdomen.  Dull to percussion.  Nontender. MSK/EXT:  Moves extremities. No apparent deformity. No edema.  SKIN: no apparent skin lesion or wound NEURO: Awake, alert and oriented appropriately.  No apparent focal neuro deficit. PSYCH: Calm.  Somewhat flat affect.  Procedures:  None  Microbiology summarized: COVID-19 PCR nonreactive.  Assessment & Plan: AKI on CKD-3A/azotemia and possible uremia-suspect prerenal from poor oral hydration and also possible mass-effect from GIST.  CT did not show urinary obstruction.  Uric acid 14.7 suggesting tumor lysis syndrome.  She is also on lisinopril which could contribute.  Denies NSAID.  Urinalysis and UPC without significant finding. Recent Labs    09/26/19 0944 12/29/19 1241 03/30/20 1306 06/12/20 1236 07/06/20 1308 07/07/20 0535 07/08/20 0613 07/09/20 0621 07/10/20 0644  BUN 9 9 9 13  80* 83* 86* 96* 98*  CREATININE 1.22* 1.36* 1.22* 1.75* 13.23* 12.88* 12.88* 12.44* 11.64*  -Not a candidate for HD given poor prognosis from a GIST. -Discontinue IV bicarbonate.  IV fluid per nephrology -Uric acid down from 14.7 to less than 0.5 after rasburicase -Monitor intake and output -Continue holding home lisinopril -Plan for home hospice.  TOC consulted.  [facility] -Pall[facility] care consulted as  well.  Stage IV gastrointestinal stromal tumor-followed by Dr. Burr Medico. CT abdomen and pelvis showed 27.0 cm cystic mass in central abdomen corresponding to patient's known GIST which was 21.4 cm 3 weeks prior.  Seems to be getting out of hand.  Poor prognosis per oncology. -Continue holding [place] for now -Oncology recommended hospice.  Family contacting [facility]. -Palliative care consulted.  Acute hypoxemic respiratory failure likely due to pleural effusion, mass-effect from malignancy, and anemia.  Appears  comfortable on nasal cannula. -Continue supplemental oxygen and wean as appropriate -Consider thoracocentesis if respiratory distress  Anemia of chronic disease in patient with history of iron deficiency anemia-likely due to Gleevec and possibly tumor lysis.  Anemia panel suggestive of anemia of chronic disease.  LDH elevated but haptoglobin within normal. Recent Labs    09/26/19 0944 12/29/19 1241 03/30/20 1306 06/12/20 1236 07/06/20 1308 07/07/20 0535 07/08/20 0613 07/10/20 0644  HGB 12.1 11.6* 11.3* 9.2* 5.0* 8.5* 8.5* 6.0*  -2 units of packed RBC ordered by oncology. -Continue monitoring  Anion gap metabolic acidosis: Acidosis resolved but elevated anion gap likely due to azotemia. -Discontinue sodium bicarbonate  Diarrhea/stool incontinence-could be related to [place].  Clinically low suspicion for C. difficile. -IV fluid as above -Continue holding [place]  Essential hypertension: Soft blood pressures but improved. -Continue holding home antihypertensive meds. -IV fluid as above  Hypokalemia/hypomagnesemia: K3.4.  Mg 1.5>> 1.8 -Replenish and repeat check  Hyperphosphatemia: P 6.6>> 4.9  Goal of care discussion-patient with stage IV GIST that is rapidly growing despite increment in the [place].  Now with renal failure and significant anemia.  Per oncology, poor prognosis from her GIST.  I discussed my concern with patient's daughter at bedside on 1/22.  Patient elected to be DNR/DNI after we discussed the pros and cons of CPR/intubation and mechanical ventilation on admission. -Plan for discharge with home hospice once approved and required DME delivered. -Palliative medicine consulted  Failure to thrive/anorexia Body mass index is 25.01 kg/m. Nutrition Problem: Increased nutrient needs Etiology: cancer and cancer related treatments (GIST) Signs/Symptoms: estimated needs     DVT prophylaxis:  Resume subcu heparin  Code Status: DNR/DNI Family Communication:  Updated patient's daughter at the bedside Status is: Inpatient  Remains inpatient appropriate because:Unsafe d/c plan, IV treatments appropriate due to intensity of illness or inability to take PO and Inpatient level of care appropriate due to severity of illness   Dispo: The patient is from: Home              Anticipated d/c is to: Home with home hospice              Anticipated d/c date is: 2 days              Patient currently is not medically stable to d/c.   Difficult to place patient No       Consultants:  Oncology Nephrology Palliative medicine   Sch Meds:  Scheduled Meds: . sodium chloride   Intravenous Once  . feeding supplement (NEPRO CARB STEADY)  237 mL Oral BID BM  . megestrol  600 mg Oral Daily  . ondansetron  4 mg Oral Q6H   Or  . ondansetron (ZOFRAN) IV  4 mg Intravenous Q6H   Continuous Infusions: . sodium chloride 75 mL/hr at 07/10/20 1021   PRN Meds:.acetaminophen **OR** acetaminophen, fentaNYL (SUBLIMAZE) injection, prochlorperazine  Antimicrobials: Anti-infectives (From admission, onward)   None       I have personally reviewed the following labs and images: CBC:  Recent Labs  Lab 07/06/20 1308 07/07/20 0535 07/08/20 0613 07/10/20 0644  WBC 6.4 7.2 7.2 5.9  NEUTROABS 5.6  --   --   --   HGB 5.0* 8.5* 8.5* 6.0*  HCT 15.8* 26.6* 26.1* 18.0*  MCV 110.5* 98.9 95.6 96.8  PLT 184 178 187 143*   BMP &GFR Recent Labs  Lab 07/06/20 1308 07/07/20 0535 07/08/20 0613 07/09/20 0621 07/10/20 0644  NA 136 137 138 141 142  K 4.8 4.7 4.3 3.8 3.4*  CL 103 106 103 98 92*  CO2 16* 17* 24 27 34*  GLUCOSE 134* 97 153* 136* 127*  BUN 80* 83* 86* 96* 98*  CREATININE 13.23* 12.88* 12.88* 12.44* 11.64*  CALCIUM 8.3* 8.3* 8.3* 8.0* 7.9*  MG  --   --  1.6* 1.5* 1.8  PHOS  --   --  6.6* 5.3* 4.9*   Estimated Creatinine Clearance: 3.5 mL/min (A) (by C-G formula based on SCr of 11.64 mg/dL (H)). Liver & Pancreas: Recent Labs  Lab 07/06/20 1308  07/08/20 0613 07/09/20 0621 07/10/20 0644  AST 20  --   --   --   ALT <6  --   --   --   ALKPHOS 48  --   --   --   BILITOT 0.4  --   --   --   PROT 6.7  --   --   --   ALBUMIN 3.2* 2.9* 2.5* 2.6*   No results for input(s): LIPASE, AMYLASE in the last 168 hours. No results for input(s): AMMONIA in the last 168 hours. Diabetic: No results for input(s): HGBA1C in the last 72 hours. No results for input(s): GLUCAP in the last 168 hours. Cardiac Enzymes: No results for input(s): CKTOTAL, CKMB, CKMBINDEX, TROPONINI in the last 168 hours. No results for input(s): PROBNP in the last 8760 hours. Coagulation Profile: No results for input(s): INR, PROTIME in the last 168 hours. Thyroid Function Tests: No results for input(s): TSH, T4TOTAL, FREET4, T3FREE, THYROIDAB in the last 72 hours. Lipid Profile: No results for input(s): CHOL, HDL, LDLCALC, TRIG, CHOLHDL, LDLDIRECT in the last 72 hours. Anemia Panel: No results for input(s): VITAMINB12, FOLATE, FERRITIN, TIBC, IRON, RETICCTPCT in the last 72 hours. Urine analysis:    Component Value Date/Time   COLORURINE YELLOW 07/07/2020 1530   APPEARANCEUR CLOUDY (A) 07/07/2020 1530   LABSPEC 1.011 07/07/2020 1530   PHURINE 5.0 07/07/2020 1530   GLUCOSEU NEGATIVE 07/07/2020 1530   HGBUR MODERATE (A) 07/07/2020 1530   BILIRUBINUR NEGATIVE 07/07/2020 1530   KETONESUR NEGATIVE 07/07/2020 1530   PROTEINUR 30 (A) 07/07/2020 1530   NITRITE NEGATIVE 07/07/2020 1530   LEUKOCYTESUR SMALL (A) 07/07/2020 1530   Sepsis Labs: Invalid input(s): PROCALCITONIN, [place]  Microbiology: Recent Results (from the past 240 hour(s))  SARS CORONAVIRUS 2 (TAT 6-24 HRS) Nasopharyngeal Nasopharyngeal Swab     Status: None   Collection Time: 07/06/20  5:01 PM   Specimen: Nasopharyngeal Swab  Result Value Ref Range Status   SARS Coronavirus 2 NEGATIVE NEGATIVE Final    Comment: (NOTE) SARS-CoV-2 target nucleic acids are NOT DETECTED.  The SARS-CoV-2 RNA  is generally detectable in upper and lower respiratory specimens during the acute phase of infection. Negative results do not preclude SARS-CoV-2 infection, do not rule out co-infections with other pathogens, and should not be used as the sole basis for treatment or other patient management decisions. Negative results must be combined with clinical observations, patient history, and epidemiological information. The expected result  is Negative.  Fact Sheet for Patients: SugarRoll.be  Fact Sheet for Healthcare Providers: https://www.woods-mathews.com/  This test is not yet approved or cleared by the [place] FDA and  has been authorized for detection and/or diagnosis of SARS-CoV-2 by FDA under an Emergency Use Authorization (EUA). This EUA will remain  in effect (meaning this test can be used) for the duration of the COVID-19 declaration under Se ction 564(b)(1) of the Act, 21 U.S.C. section 360bbb-3(b)(1), unless the authorization is terminated or revoked sooner.  Performed at [facility], [place] [address], [place], [place] [address]     Radiology Studies: No results found.    Retaj Hilbun T. [place]  If 7PM-7AM, please contact night-coverage [email or web address] 07/10/2020, 4:21 PM

## 2020-07-10 NOTE — Progress Notes (Signed)
Chaplain engaged in initial visit with Haley Mcmillan and her daughter, Haley Mcmillan.  Chaplain was able to learn about Haley Mcmillan who is the mother of three children.  Two of her sons are deceased and her daughter, who lives in [place], serves as one of her primary caregivers.  Haley Mcmillan also has a number of grandchildren and great grandchildren that she is very close too.  Haley Mcmillan stays at home with her husband and daughter plans to be there regularly, along with her husband, to provide support.    Haley Mcmillan described her mom as the "[place]."  Her home is the place where the family normally meets to come together to eat and play family games with each other.  She is respected and honored as the Stage manager of their family.  Chaplain offered support and ministries of listening and presence.  Haley Mcmillan shared that her mother will be discharging soon.    07/10/20 1100  Clinical Encounter Type  Visited With Patient and family together  Visit Type Initial

## 2020-07-11 DIAGNOSIS — R630 Anorexia: Secondary | ICD-10-CM

## 2020-07-11 LAB — BPAM RBC
Blood Product Expiration Date: 202202192359
Blood Product Expiration Date: 202202192359
ISSUE DATE / TIME: 202201251526
ISSUE DATE / TIME: 202201252311
Unit Type and Rh: 5100
Unit Type and Rh: 5100

## 2020-07-11 LAB — CBC
HCT: 27.5 % — ABNORMAL LOW (ref 36.0–46.0)
Hemoglobin: 9.1 g/dL — ABNORMAL LOW (ref 12.0–15.0)
MCH: 32 pg (ref 26.0–34.0)
MCHC: 33.1 g/dL (ref 30.0–36.0)
MCV: 96.8 fL (ref 80.0–100.0)
Platelets: 141 K/uL — ABNORMAL LOW (ref 150–400)
RBC: 2.84 MIL/uL — ABNORMAL LOW (ref 3.87–5.11)
RDW: 20.5 % — ABNORMAL HIGH (ref 11.5–15.5)
WBC: 6.1 K/uL (ref 4.0–10.5)
nRBC: 0 % (ref 0.0–0.2)

## 2020-07-11 LAB — COMPREHENSIVE METABOLIC PANEL WITH GFR
ALT: 9 U/L (ref 0–44)
AST: 20 U/L (ref 15–41)
Albumin: 2.5 g/dL — ABNORMAL LOW (ref 3.5–5.0)
Alkaline Phosphatase: 40 U/L (ref 38–126)
Anion gap: 17 — ABNORMAL HIGH (ref 5–15)
BUN: 97 mg/dL — ABNORMAL HIGH (ref 8–23)
CO2: 32 mmol/L (ref 22–32)
Calcium: 8.1 mg/dL — ABNORMAL LOW (ref 8.9–10.3)
Chloride: 95 mmol/L — ABNORMAL LOW (ref 98–111)
Creatinine, Ser: 11.11 mg/dL — ABNORMAL HIGH (ref 0.44–1.00)
GFR, Estimated: 3 mL/min — ABNORMAL LOW
Glucose, Bld: 93 mg/dL (ref 70–99)
Potassium: 3.6 mmol/L (ref 3.5–5.1)
Sodium: 144 mmol/L (ref 135–145)
Total Bilirubin: 0.5 mg/dL (ref 0.3–1.2)
Total Protein: 5.5 g/dL — ABNORMAL LOW (ref 6.5–8.1)

## 2020-07-11 LAB — MAGNESIUM: Magnesium: 1.8 mg/dL (ref 1.7–2.4)

## 2020-07-11 LAB — TYPE AND SCREEN
ABO/RH(D): O POS
Antibody Screen: NEGATIVE
Unit division: 0
Unit division: 0

## 2020-07-11 LAB — PHOSPHORUS: Phosphorus: 5.1 mg/dL — ABNORMAL HIGH (ref 2.5–4.6)

## 2020-07-11 LAB — URIC ACID: Uric Acid, Serum: <0.5 mg/dL — ABNORMAL LOW (ref 2.5–7.1)

## 2020-07-11 LAB — OCCULT BLOOD X 1 CARD TO LAB, STOOL: Fecal Occult Bld: POSITIVE — AB

## 2020-07-11 NOTE — Progress Notes (Signed)
Manufacturing engineer [facility]) Hospital Liaison Note:  Referral received for hospice services at home upon discharge today. Spoke with daughter Vito Backers to confirm interest, explain services and offer support. [facility] MD reviewing chart - hospice eligibility pending at this time.   Plan is to discharge home today via private vehicle once DME has been delivered to the home address in Epic.    Patient will need: Bedside commode, oxygen set up/tank, ,an over the bed table and a portable Oxygen tank to be delivered to the hospital prior to discharge. [facility] will order.     Please send completed DNR and arrange for any comfort scripts that may be needed for symptom management so there is no lapse in patient comfort prior to hospice services beginning.  Thank you for allowing [place] to participate in this patient's care,   Gar Ponto, RN [place] (in [place]) [phone]

## 2020-07-11 NOTE — Consult Note (Signed)
Consultation Note Date: 07/11/2020   Patient Name: Haley Mcmillan  DOB: 1934-06-12  MRN: [id number]  Age / Sex: 87 y.o., female  PCP: Hoyt Koch, MD Referring Physician: Georgette Shell, MD  Reason for Consultation: Establishing goals of care  HPI/Patient Profile: 87 y.o. 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  Reviewed chart and discussed with oncology team today.  Plan moving forward is to work toward transition home with hospice support.  Clinical Assessment and Goals of Care: I met today with Haley Mcmillan.   No family was present during the time of my encounter.  She was sitting in bedside chair in no distress.  She answers simple questions but did not really fully engage in conversation.  Attempted to discuss her understanding of her situation.  She is very reluctant to talk about anything regarding her cancer her prognosis.  She did indicate 1) she wants to be at home, 2) being with her husband and family is the most important thing to her moving forward, and 3) she wants to feel as well as she can for as long as she can.    I discussed her case with transition of care as well as oncology team.  Patient's daughter and husband understand her situation and the goal is to work to get her home with hospice support.  Case manager is going to call and discuss hospice choice further with daughter today.  SUMMARY OF RECOMMENDATIONS   -I agree Haley Mcmillan will be best served by transition home with hospice support.  She is not really open to discussion with me today about her overall situation, but she does indicate she wants to be at home and seems to be open to any  support that would support this goal (she is noted to be scared of the word "hospice"). -Case management is working in conjunction with family to arrange home hospice services. -Goals seem to be clear and right now working on plans for disposition.  No other specific palliative care recommendations at this time.  Please call if there are other areas with which we can be of assistance in Haley Mcmillan moving forward.  Code Status/Advance Care Planning:  DNR  Prognosis:  < 6 months if her disease follows its natural course and she should qualify for home hospice services at time of discharge.  Discharge Planning: Home with Hospice      Primary Diagnoses: Present on Admission: . AKI (acute kidney injury) ([place])   I have reviewed the medical record, interviewed the patient and family, and examined the patient. The following aspects are pertinent.  Past Medical History:  Diagnosis Date  . Anemia   . Arthritis   . Colon polyps   . GIST DX'D  03/2016  . Olecranon bursitis    Elbow  . Other and unspecified hyperlipidemia   .  Palpitations   . Unspecified essential hypertension    Social History   Socioeconomic History  . Marital status: Married    Spouse name: Not on file  . Number of children: 1  . Years of education: Not on file  . Highest education level: Not on file  Occupational History  . Occupation: retired    Fish farm manager: RETIRED  Tobacco Use  . Smoking status: Former Smoker    Packs/day: 0.50    Years: 50.00    Pack years: 25.00    Types: Cigarettes    Quit date: 10/31/1994    Years since quitting: 25.7  . Smokeless tobacco: Never Used  . Tobacco comment: SMOKED SOME; QUIT 1996;   Vaping Use  . Vaping Use: Never used  Substance and Sexual Activity  . Alcohol use: Not Currently    Alcohol/week: 0.0 standard drinks    Comment: wine  . Drug use: No  . Sexual activity: Not Currently  Other Topics Concern  . 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         Social Determinants of Health   Financial Resource Strain: Not on file  Food Insecurity: Not on file  Transportation Needs: Not on file  Physical Activity: Not on file  Stress: Not on file  Social Connections: Not on file   Family History  Problem Relation Age of Onset  . Gallstones Mother   . Aneurysm Father        brain  . Heart disease Father        CAD  . Breast cancer Sister   . Breast cancer Sister   . Heart attack Brother   . Cancer Brother   . Lung cancer Son   . Diabetes Neg Hx    Scheduled Meds: . sodium chloride   Intravenous Once  . feeding supplement (NEPRO CARB STEADY)  237 mL Oral BID BM  . megestrol  600 mg Oral Daily  . ondansetron  4 mg Oral Q6H   Or  . ondansetron (ZOFRAN) IV  4 mg Intravenous Q6H   Continuous Infusions: . sodium chloride 75 mL/hr at 07/10/20 08-07-2334   PRN Meds:.acetaminophen **OR** acetaminophen, fentaNYL (SUBLIMAZE) injection, prochlorperazine Medications Prior to Admission:  Prior to Admission medications   Medication Sig Start Date End Date Taking? Authorizing Provider  amLODipine (NORVASC) 5 MG tablet Take 1 Tablet by mouth daily 04/30/20  Yes Hoyt Koch, MD  imatinib (GLEEVEC) 400 MG tablet Take 1 tablet (400 mg total) by mouth 2 (two) times daily. Take with meals and large glass of water.Caution:Chemotherapy. 06/19/20  Yes Truitt Merle, MD  lisinopril (ZESTRIL) 20 MG tablet TAKE 1 TABLET(20 MG) BY MOUTH DAILY 04/30/20  Yes Hoyt Koch, MD  ondansetron (ZOFRAN) 8 MG tablet Take 1 tablet (8 mg total) by mouth every 8 (eight) hours as needed for nausea or vomiting. 07/04/20  Yes Truitt Merle, MD  pantoprazole (PROTONIX) 40 MG tablet Take 1 tablet (40 mg total) by mouth daily. 05/09/20   Yes Hoyt Koch, MD  Blood Pressure Monitoring (BLOOD PRESSURE CUFF) MISC Measure BP daily  Dx: hypertension 07/09/18   Binnie Rail, MD   No Known  Allergies Review of Systems  Constitutional: Positive for activity change and fatigue.  Neurological: Positive for weakness.   Physical Exam General: Alert, awake, in no acute distress. Frail and chronically ill-appearing HEENT: No bruits, no goiter, no JVD Heart: Regular rate and rhythm. No murmur appreciated. Lungs: Fair air movement, clear Abdomen: distended, positive bowel sounds.  Ext: No significant edema Skin: Warm and dry Neuro: Grossly intact, nonfocal.   Vital Signs: BP (!) 102/53 (BP Location: Right Arm)   Pulse 72   Temp 99.3 F (37.4 C) (Oral)   Resp 16   Ht _0  (1.702 m)   Wt 72.4 kg   SpO2 97%   BMI 25.01 kg/m  Pain Scale: 0-10   Pain Score: 0-No pain   SpO2: SpO2: 97 % O2 Device:SpO2: 97 % O2 Flow Rate: .O2 Flow Rate (L/min): 6 L/min  IO: Intake/output summary:   Intake/Output Summary (Last 24 hours) at 07/11/2020 0840 Last data filed at 07/11/2020 9758 Gross per 24 hour  Intake 2334.67 ml  Output 450 ml  Net 1884.67 ml    LBM: Last BM Date: 07/10/20 Baseline Weight: Weight: 72.4 kg Most recent weight: Weight: 72.4 kg     Palliative Assessment/Data:   Flowsheet Rows   Flowsheet Row Most Recent Value  Intake Tab   Referral Department Hospitalist  Unit at Time of Referral Oncology Unit  Palliative Care Primary Diagnosis Cancer  Date Notified 07/07/20  Reason for referral Clarify Goals of Care  Date of Admission 07/06/20  Date first seen by Palliative Care 07/10/20  # of days Palliative referral response time 3 Day(s)  # of days IP prior to Palliative referral 1  Clinical Assessment   Palliative Performance Scale Score 40%  Psychosocial & Spiritual Assessment   Palliative Care Outcomes   Patient/Family meeting held? Yes  Who was at the meeting? Patient      Time In:  1300 Time Out: 1350 Time Total: 50 Greater than 50%  of this time was spent counseling and coordinating care related to the above assessment and plan.  Signed by: Micheline Rough, MD   Please contact Palliative Medicine Team phone at [phone] for questions and concerns.  For individual provider: See Shea Evans

## 2020-07-11 NOTE — TOC Progression Note (Signed)
Transition of Care [facility]) - Progression Note    Patient Details  Name: Haley Mcmillan MRN: [id number] Date of Birth: Oct 26, 1933  [facility]) CM/SW Contact  Zayden Hahne, Marjie Skiff, RN Phone Number: 07/11/2020, 10:13 AM  Clinical Narrative:    Pt daughter Vito Backers contacted me this morning with choice for home hospice. They have chosen [facility]. Cassandra requesting over bed table, BSC and home 02. [facility] liaison contacted for referral and above DME. Yellow DNR signed by MD and to be sent home with family. Portable 02 tank to be delivered to pt room for transport home.  Expected Discharge Plan: Home w Hospice Care Barriers to Discharge: Continued Medical Work up  Expected Discharge Plan and Services Expected Discharge Plan: [place]         Expected Discharge Date: 07/11/20                     Social Determinants of Health (SDOH) Interventions    Readmission Risk Interventions No flowsheet data found.

## 2020-07-11 NOTE — Progress Notes (Signed)
Daily Progress Note   Patient Name: Haley Mcmillan       Date: 07/11/2020 DOB: September 30, 1933  Age: 87 y.o. MRN#: [id number] Attending Physician: No att. providers found Primary Care Physician: Hoyt Koch, MD Admit Date: 07/06/2020  Reason for Consultation/Follow-up: Disposition and Establishing goals of care  Subjective: Ms. Winborne denies complaints this morning.  Discussed plan to discharge home and she is looking forward to being home with family.  Length of Stay: 5  Current Medications: Scheduled Meds:  . sodium chloride   Intravenous Once  . feeding supplement (NEPRO CARB STEADY)  237 mL Oral BID BM  . ondansetron  4 mg Oral Q6H   Or  . ondansetron (ZOFRAN) IV  4 mg Intravenous Q6H    Continuous Infusions: . sodium chloride 75 mL/hr at 07/10/20 2336    PRN Meds: acetaminophen **OR** acetaminophen, fentaNYL (SUBLIMAZE) injection, prochlorperazine  Physical Exam         General: Alert, awake, in no acute distress.  Heart: Regular rate and rhythm. No murmur appreciated. Lungs: Decreased air movement, clear Abdomen: Soft, nontender, nondistended, positive bowel sounds.  Ext: No significant edema Skin: Warm and dry   Vital Signs: BP (!) 105/57 (BP Location: Right Arm)   Pulse 78   Temp 98 F (36.7 C) (Oral)   Resp 16   Ht 5\' 7"  (1.702 m)   Wt 72.4 kg   SpO2 95%   BMI 25.01 kg/m  SpO2: SpO2: 95 % O2 Device: O2 Device: Nasal Cannula O2 Flow Rate: O2 Flow Rate (L/min): 6 L/min  Intake/output summary:   Intake/Output Summary (Last 24 hours) at 07/11/2020 1806 Last data filed at 07/11/2020 8676 Gross per 24 hour  Intake 1924.67 ml  Output 650 ml  Net 1274.67 ml   LBM: Last BM Date: 07/10/20 Baseline Weight: Weight: 72.4 kg Most recent weight: Weight:  72.4 kg       Palliative Assessment/Data:    Flowsheet Rows   Flowsheet Row Most Recent Value  Intake Tab   Referral Department Hospitalist  Unit at Time of Referral Oncology Unit  Palliative Care Primary Diagnosis Cancer  Date Notified 07/07/20  Reason for referral Clarify Goals of Care  Date of Admission 07/06/20  Date first seen by Palliative Care 07/10/20  # of days Palliative referral response time 3  Day(s)  # of days IP prior to Palliative referral 1  Clinical Assessment   Palliative Performance Scale Score 40%  Psychosocial & Spiritual Assessment   Palliative Care Outcomes   Patient/Family meeting held? Yes  Who was at the meeting? Patient      Patient Active Problem List   Diagnosis Date Noted  . Anorexia   . AKI (acute kidney injury) ([place]) 07/06/2020  . Bloating 05/09/2020  . Alopecia 08/12/2017  . Atherosclerosis of aorta ([place]) 05/12/2017  . Iron deficiency anemia due to chronic blood loss 05/12/2016  . Numbness of foot 05/05/2016  . Malignant gastrointestinal stromal tumor (GIST) of stomach ([place]) 04/20/2016  . Liver nodule 04/08/2016  . Loss of weight 03/04/2016  . Routine health maintenance 12/25/2012  . Hyperlipidemia 06/21/2007  . Essential hypertension 06/21/2007  . Palpitations 06/21/2007    Palliative Care Assessment & Plan   Patient Profile: 87 y.o. 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  Reviewed chart and discussed with oncology team today.  Plan moving forward is to work toward transition home with hospice support.  Recommendations/Plan: Symptoms at this time appear well managed. Plan for home with hospice today.  Goals of Care and Additional Recommendations: Limitations on Scope of Treatment: Full Comfort  Care  Code Status:    Code Status Orders  (From admission, onward)         Start     Ordered   07/06/20 1636  Do not attempt resuscitation (DNR)  Continuous       Question Answer Comment  In the event of cardiac or respiratory ARREST Do not call a "code blue"   In the event of cardiac or respiratory ARREST Do not perform Intubation, CPR, defibrillation or ACLS   In the event of cardiac or respiratory ARREST Use medication by any route, position, wound care, and other measures to relive pain and suffering. May use oxygen, suction and manual treatment of airway obstruction as needed for comfort.      07/06/20 1638        Code Status History    Date Active Date Inactive Code Status Order ID Comments User Context   04/08/2016 1434 04/12/2016 0148 Full Code [id number]  Radene Gunning, NP ED   Advance Care Planning Activity      Prognosis:  < 6 months  Discharge Planning: Home with Hospice  Care plan was discussed with patient  Thank you for allowing the Palliative Medicine Team to assist in the care of this patient.   Time In: 0830 Time Out: 0845 Total Time 15 Prolonged Time Billed No      Greater than 50%  of this time was spent counseling and coordinating care related to the above assessment and plan.  Micheline Rough, MD  Please contact Palliative Medicine Team phone at [phone] for questions and concerns.

## 2020-07-11 NOTE — Discharge Summary (Signed)
Physician Discharge Summary  Haley Mcmillan HWE:[id number] DOB: Apr 10, 1934 DOA: 07/06/2020  PCP: Hoyt Koch, MD  Admit date: 07/06/2020 Discharge date: 07/11/2020  Admitted From: Home Disposition: Home Recommendations for Outpatient Follow-up: Hospice discharge  Home Health: Hospice discharge Equipment/Devices: O2 at 2 L  Discharge Condition hospice discharge CODE STATUS: DO NOT RESUSCITATE Diet recommendation: Regular diet Brief/Interim Summary:87 y.o.femalewith history ofstage IV gastrointestinal stromal tumor on Gleevec followed by Dr. Burr Medico, CKD-3A, IDA, HTN, HLDanddiminished hearing admitted from oncology office with AKI and anemia. Cr 13.23 from 1.75 three weeks prior.  BUN 80.  Hgb 5.0 from 9.2 three weeks prior. CT abdomen and pelvis showed 27.0 cm cystic mass in central abdomen corresponding to patient's known GIST which was 21.4 cm 3 weeks prior.  Uric acid 14.7 raising concern for tumor lysis syndrome.  Received a dose of rasburicase 6 mg.  Oncology does not recommend HD given a poor prognosis from GIST.  Nephrology recommended IV fluid.  Eventually, plan is to go home with home hospice.  TOC WAS  consulted.   Discharge Diagnoses:  Active Problems:   AKI (acute kidney injury) ([place])     AKI on CKD-3A/azotemia and possible uremia-suspect prerenal from poor oral hydration and also possible mass-effect from GIST.  CT did not show urinary obstruction.  Uric acid 14.7 suggesting tumor lysis syndrome.  She is also on lisinopril which could contribute.  Denies NSAID.  Urinalysis and UPC without significant finding. Recent Labs (within last 365 days)  Recent Labs    09/26/19 0944 12/29/19 1241 03/30/20 1306 06/12/20 1236 07/06/20 1308 07/07/20 0535 07/08/20 0613 07/09/20 0621 07/10/20 0644  BUN 9 9 9 13  80* 83* 86* 96* 98*  CREATININE 1.22* 1.36* 1.22* 1.75* 13.23* 12.88* 12.88* 12.44* 11.64*    -Not a candidate for HD given poor prognosis from a  GIST. -Discontinue IV bicarbonate.  IV fluid per nephrology -Uric acid down from 14.7 to less than 0.5 after rasburicase -Plan for home hospice.   Stage IV gastrointestinal stromal tumor-followed by Dr. Burr Medico. CT abdomen and pelvis showed 27.0 cm cystic mass in central abdomen corresponding to patient's known GIST which was 21.4 cm 3 weeks prior.  Seems to be getting out of hand.  Poor prognosis per oncology. -Oncology recommended hospice.  Acute hypoxemic respiratory failure likely due to pleural effusion, mass-effect from malignancy, and anemia.  Appears comfortable on nasal cannula. -Continue supplemental oxygen and wean as appropriate  Anemia of chronic disease in patient with history of iron deficiency anemia-likely due to Gleevec and possibly tumor lysis.  Anemia panel suggestive of anemia of chronic disease.  Goal of care discussion-patient with stage IV GIST that is rapidly growing despite increment in the [place].  Now with renal failure and significant anemia.  Per oncology, poor prognosis from her GIST. -Plan for discharge with home hospice once approved and required DME delivered.  Nutrition Problem: Increased nutrient needs Etiology: cancer and cancer related treatments (GIST)   Signs/Symptoms: estimated needs  Estimated body mass index is 25.01 kg/m as calculated from the following:   Height as of this encounter: 5\' 7"  (1.702 m).   Weight as of this encounter: 72.4 kg.  Discharge Instructions  Discharge Instructions    Call MD for:  difficulty breathing, headache or visual disturbances   Complete by: As directed    Call MD for:  persistant nausea and vomiting   Complete by: As directed    Diet - low sodium heart healthy   Complete by: As  directed    Increase activity slowly   Complete by: As directed      Allergies as of 07/11/2020   No Known Allergies     Medication List    STOP taking these medications   amLODipine 5 MG tablet Commonly known as:  NORVASC   Blood Pressure Cuff Misc   imatinib 400 MG tablet Commonly known as: GLEEVEC   lisinopril 20 MG tablet Commonly known as: ZESTRIL   pantoprazole 40 MG tablet Commonly known as: PROTONIX     TAKE these medications   ondansetron 8 MG tablet Commonly known as: ZOFRAN Take 1 tablet (8 mg total) by mouth every 8 (eight) hours as needed for nausea or vomiting.       No Known Allergies  Consultations: Oncology and palliative care   Procedures/Studies: CT ABDOMEN PELVIS WO CONTRAST  Result Date: 07/06/2020 CLINICAL DATA:  Acute renal failure.  Malignant GI stromal tumor. EXAM: CT ABDOMEN AND PELVIS WITHOUT CONTRAST TECHNIQUE: Multidetector CT imaging of the abdomen and pelvis was performed following the standard protocol without IV contrast. COMPARISON:  06/12/2020 FINDINGS: Lower chest: Small left and trace right pleural effusions, progressive. Associated lower lobe opacities, likely atelectasis, although superimposed mild right lower lobe infection/pneumonia is possible. Hepatobiliary: Scattered low-density liver lesions. A dominant 2.1 cm lesion in the posterior right hepatic lobe (series 2/image 19) is unchanged from most recent CT but progressive from 2020. A 13 mm lesion in the high right hepatic dome (series 2/image 13) is also new from 2020. This raises concern for metastatic disease. Gallbladder is grossly unremarkable. No intrahepatic or extrahepatic ductal dilatation. Pancreas: Within normal limits. Spleen: Within normal limits. Adrenals/Urinary Tract: Adrenal glands are within normal limits. Kidneys are within normal limits. No renal, ureteral, or bladder calculi. No hydronephrosis. Bladder is underdistended but grossly unremarkable. Stomach/Bowel: Tiny hiatal hernia. Large mass (described below) abuts and may arise from the undersurface of the gastric antrum. No evidence of bowel obstruction. Appendix is not discretely visualized. Vascular/Lymphatic: No evidence of  abdominal aortic aneurysm. Atherosclerotic calcifications of the abdominal aorta and branch vessels. No suspicious abdominopelvic lymphadenopathy. Reproductive: Uterus is poorly visualized on the current CT. No adnexal masses. Other: 17.8 x 27.0 x 25.5 cm predominantly cystic mass in the central abdomen (series 2/image 50), corresponding to the patient's known GI stromal tumor. This is poorly evaluated on the current noncontrast CT but abuts the undersurface of the stomach. This previously measured 12.6 x 21.4 x 19.8 cm, reflecting significant interval growth. Small volume abdominopelvic ascites. Mild peritoneal disease is suspected beneath the anterior abdominal wall (for example, series 2/images 42 and 56), although this remains equivocal/poorly evaluated. Musculoskeletal: Mild degenerative changes of the lower thoracic spine. IMPRESSION: No renal, ureteral, or bladder calculi.  No hydronephrosis. 27.0 cm predominantly cystic mass in the central abdomen, corresponding to the patient's known GI stromal tumor, previously 21.4 cm. Suspected hepatic metastases. Suspected mild peritoneal disease, although poorly evaluated. Small volume abdominopelvic ascites. Small volume abdominopelvic ascites. Small left and trace right pleural effusions, progressive. Associated lower lobe opacities, likely atelectasis, although superimposed mild right lower lobe pneumonia is possible. Electronically Signed   By: Julian Hy M.D.   On: 07/06/2020 18:43   CT ABDOMEN PELVIS WO CONTRAST  Result Date: 06/12/2020 CLINICAL DATA:  Follow-up malignant GI stromal tumor. Evaluate treatment response. EXAM: CT ABDOMEN AND PELVIS WITHOUT CONTRAST TECHNIQUE: Multidetector CT imaging of the abdomen and pelvis was performed following the standard protocol without IV contrast. COMPARISON:  03/29/2019 FINDINGS: Lower  chest: New small left pleural effusion and left lower lobe atelectasis. Hepatobiliary: Multiple small low-attenuation liver  lesions are again seen, without significant change. Gallbladder is unremarkable. No evidence of biliary ductal dilatation. Pancreas: No mass or inflammatory process visualized on this unenhanced exam. Spleen:  Within normal limits in size. Adrenals/Urinary tract: No evidence of urolithiasis or hydronephrosis. Unremarkable unopacified urinary bladder. Stomach/Bowel: Small hiatal hernia noted. No evidence of obstruction, inflammatory process, or abnormal fluid collections. Vascular/Lymphatic: No pathologically enlarged lymph nodes identified. No evidence of abdominal aortic aneurysm. Aortic atherosclerotic calcification noted. Reproductive:  No mass or other significant abnormality. Other: Moderate ascites is new since previous study. A new large lobulated soft tissue mass is seen in the left abdomen which measures 21.4 x 12.1 cm on image 49/2. Nodular soft tissue densities with punctate calcifications are again seen in the gastrosplenic ligament along the greater curvature of the proximal gastric body, without significant change. Diffuse mesenteric and body wall edema is new since prior exam. Musculoskeletal:  No suspicious bone lesions identified. IMPRESSION: New large lobulated soft tissue mass in the left abdomen, consistent with recurrent malignancy or metastatic disease. New moderate ascites. Diffuse mesenteric and body wall edema also noted. Stable nodular soft tissue densities in the gastrosplenic ligament along the greater curvature of the proximal gastric body, consistent with metastatic disease. No significant change in multiple small low-attenuation liver lesions. Small hiatal hernia. Aortic Atherosclerosis (ICD10-I70.0). Electronically Signed   By: Marlaine Hind M.D.   On: 06/12/2020 16:08   DG Chest 2 View  Result Date: 07/08/2020 CLINICAL DATA:  87 year old female with hypoxia. Stage IV gastrointestinal stromal tumor. EXAM: CHEST - 2 VIEW COMPARISON:  Chest radiograph dated 08/25/2016. FINDINGS:  Shallow inspiration. Small to moderate bilateral pleural effusions with bibasilar compressive atelectasis. Pneumonia is not excluded clinical correlation is recommended. No pneumothorax. Mild cardiomegaly. Atherosclerotic calcification of the aorta. No acute osseous pathology. IMPRESSION: Bilateral pleural effusions with bibasilar atelectasis versus infiltrate. Electronically Signed   By: Anner Crete M.D.   On: 07/08/2020 15:21    (Echo, Carotid, EGD, Colonoscopy, ERCP)    Subjective:  Patient resting in recliner in no acute distress on oxygen. Discharge Exam: Vitals:   07/11/20 0817 07/11/20 1206  BP: (!) 102/53 (!) 105/57  Pulse: 72 78  Resp: 16   Temp: 99.3 F (37.4 C) 98 F (36.7 C)  SpO2: 97% 95%   Vitals:   07/11/20 0203 07/11/20 0520 07/11/20 0817 07/11/20 1206  BP: (!) 103/51 (!) 110/58 (!) 102/53 (!) 105/57  Pulse: 74 79 72 78  Resp: 14 17 16    Temp: 99.5 F (37.5 C) 99.2 F (37.3 C) 99.3 F (37.4 C) 98 F (36.7 C)  TempSrc: Oral Oral Oral Oral  SpO2: 94% 98% 97% 95%  Weight:      Height:        General: Pt is alert, awake, not in acute distress Cardiovascular: RRR, S1/S2 +, no rubs, no gallops Respiratory: Diminished breath sounds bilaterally, no wheezing, no rhonchi Abdominal: Soft, NT, ND, bowel sounds + Extremities: no edema, no cyanosis    The results of significant diagnostics from this hospitalization (including imaging, microbiology, ancillary and laboratory) are listed below for reference.     Microbiology: Recent Results (from the past 240 hour(s))  SARS CORONAVIRUS 2 (TAT 6-24 HRS) Nasopharyngeal Nasopharyngeal Swab     Status: None   Collection Time: 07/06/20  5:01 PM   Specimen: Nasopharyngeal Swab  Result Value Ref Range Status   SARS Coronavirus 2  NEGATIVE NEGATIVE Final    Comment: (NOTE) SARS-CoV-2 target nucleic acids are NOT DETECTED.  The SARS-CoV-2 RNA is generally detectable in upper and lower respiratory specimens during  the acute phase of infection. Negative results do not preclude SARS-CoV-2 infection, do not rule out co-infections with other pathogens, and should not be used as the sole basis for treatment or other patient management decisions. Negative results must be combined with clinical observations, patient history, and epidemiological information. The expected result is Negative.  Fact Sheet for Patients: SugarRoll.be  Fact Sheet for Healthcare Providers: https://www.woods-Jaysten Essner.com/  This test is not yet approved or cleared by the [place] FDA and  has been authorized for detection and/or diagnosis of SARS-CoV-2 by FDA under an Emergency Use Authorization (EUA). This EUA will remain  in effect (meaning this test can be used) for the duration of the COVID-19 declaration under Se ction 564(b)(1) of the Act, 21 U.S.C. section 360bbb-3(b)(1), unless the authorization is terminated or revoked sooner.  Performed at [facility], [place] [address], [place], [place] [address]      Labs: BNP (last 3 results) No results for input(s): BNP in the last 8760 hours. Basic Metabolic Panel: Recent Labs  Lab 07/07/20 0535 07/08/20 0613 07/09/20 0621 07/10/20 0644 07/11/20 0630  NA 137 138 141 142 144  K 4.7 4.3 3.8 3.4* 3.6  CL 106 103 98 92* 95*  CO2 17* 24 27 34* 32  GLUCOSE 97 153* 136* 127* 93  BUN 83* 86* 96* 98* 97*  CREATININE 12.88* 12.88* 12.44* 11.64* 11.11*  CALCIUM 8.3* 8.3* 8.0* 7.9* 8.1*  MG  --  1.6* 1.5* 1.8 1.8  PHOS  --  6.6* 5.3* 4.9* 5.1*   Liver Function Tests: Recent Labs  Lab 07/06/20 1308 07/08/20 0613 07/09/20 0621 07/10/20 0644 07/11/20 0630  AST 20  --   --   --  20  ALT <6  --   --   --  9  ALKPHOS 48  --   --   --  40  BILITOT 0.4  --   --   --  0.5  PROT 6.7  --   --   --  5.5*  ALBUMIN 3.2* 2.9* 2.5* 2.6* 2.5*   No results for input(s): LIPASE, AMYLASE in the last 168 hours. No results for  input(s): AMMONIA in the last 168 hours. CBC: Recent Labs  Lab 07/06/20 1308 07/07/20 0535 07/08/20 0613 07/10/20 0644 07/11/20 0630  WBC 6.4 7.2 7.2 5.9 6.1  NEUTROABS 5.6  --   --   --   --   HGB 5.0* 8.5* 8.5* 6.0* 9.1*  HCT 15.8* 26.6* 26.1* 18.0* 27.5*  MCV 110.5* 98.9 95.6 96.8 96.8  PLT 184 178 187 143* 141*   Cardiac Enzymes: No results for input(s): CKTOTAL, CKMB, CKMBINDEX, TROPONINI in the last 168 hours. BNP: Invalid input(s): POCBNP CBG: No results for input(s): GLUCAP in the last 168 hours. D-Dimer No results for input(s): DDIMER in the last 72 hours. Hgb A1c No results for input(s): HGBA1C in the last 72 hours. Lipid Profile No results for input(s): CHOL, HDL, LDLCALC, TRIG, CHOLHDL, LDLDIRECT in the last 72 hours. Thyroid function studies No results for input(s): TSH, T4TOTAL, T3FREE, THYROIDAB in the last 72 hours.  Invalid input(s): FREET3 Anemia work up No results for input(s): VITAMINB12, FOLATE, FERRITIN, TIBC, IRON, RETICCTPCT in the last 72 hours. Urinalysis    Component Value Date/Time   COLORURINE YELLOW 07/07/2020 1530   APPEARANCEUR  CLOUDY (A) 07/07/2020 1530   LABSPEC 1.011 07/07/2020 1530   PHURINE 5.0 07/07/2020 1530   GLUCOSEU NEGATIVE 07/07/2020 1530   HGBUR MODERATE (A) 07/07/2020 1530   BILIRUBINUR NEGATIVE 07/07/2020 1530   KETONESUR NEGATIVE 07/07/2020 1530   PROTEINUR 30 (A) 07/07/2020 1530   NITRITE NEGATIVE 07/07/2020 1530   LEUKOCYTESUR SMALL (A) 07/07/2020 1530   Sepsis Labs Invalid input(s): PROCALCITONIN,  WBC,  LACTICIDVEN Microbiology Recent Results (from the past 240 hour(s))  SARS CORONAVIRUS 2 (TAT 6-24 HRS) Nasopharyngeal Nasopharyngeal Swab     Status: None   Collection Time: 07/06/20  5:01 PM   Specimen: Nasopharyngeal Swab  Result Value Ref Range Status   SARS Coronavirus 2 NEGATIVE NEGATIVE Final    Comment: (NOTE) SARS-CoV-2 target nucleic acids are NOT DETECTED.  The SARS-CoV-2 RNA is generally  detectable in upper and lower respiratory specimens during the acute phase of infection. Negative results do not preclude SARS-CoV-2 infection, do not rule out co-infections with other pathogens, and should not be used as the sole basis for treatment or other patient management decisions. Negative results must be combined with clinical observations, patient history, and epidemiological information. The expected result is Negative.  Fact Sheet for Patients: SugarRoll.be  Fact Sheet for Healthcare Providers: https://www.woods-Dyna Figuereo.com/  This test is not yet approved or cleared by the [place] FDA and  has been authorized for detection and/or diagnosis of SARS-CoV-2 by FDA under an Emergency Use Authorization (EUA). This EUA will remain  in effect (meaning this test can be used) for the duration of the COVID-19 declaration under Se ction 564(b)(1) of the Act, 21 U.S.C. section 360bbb-3(b)(1), unless the authorization is terminated or revoked sooner.  Performed at [facility], [place] [address], [place], [place] [address]      Time coordinating discharge: 38 minutes  SIGNED:   Georgette Shell, MD  [facility] 07/11/2020, 4:13 PM

## 2020-07-12 ENCOUNTER — Telehealth: Payer: Self-pay | Admitting: Internal Medicine

## 2020-07-12 NOTE — Telephone Encounter (Signed)
Of course!

## 2020-07-12 NOTE — Telephone Encounter (Signed)
Spoke with Haley Mcmillan to let her know that Dr. Sharlet Salina will be the ending while the patient is under hospice care. No other questions or concerns at this time.

## 2020-07-12 NOTE — Telephone Encounter (Signed)
° °  Horris Latino from [employer] to confirm Dr Sharlet Salina would be attending, hospice care Phone [phone]

## 2020-07-12 NOTE — Telephone Encounter (Signed)
See below

## 2020-08-14 DEATH — deceased

## 2020-09-04 ENCOUNTER — Other Ambulatory Visit (HOSPITAL_BASED_OUTPATIENT_CLINIC_OR_DEPARTMENT_OTHER): Payer: Self-pay

## 2021-01-22 ENCOUNTER — Telehealth: Payer: Self-pay | Admitting: Internal Medicine

## 2021-01-22 NOTE — Telephone Encounter (Signed)
Left message for patient to call me back at [phone] to schedule Medicare Annual Wellness Visit   Last AWV  09/27/18  Please schedule AWVS at anytime with [facility] if patient calls the office back.  89 Minutes appointment   Any questions, please call me at [phone]

## 2021-01-24 ENCOUNTER — Telehealth: Payer: Self-pay | Admitting: Internal Medicine

## 2021-01-24 NOTE — Telephone Encounter (Signed)
Called pt to schedule AWV with [facility], but no answer.
